# Patient Record
Sex: Female | Born: 1985 | Race: White | Hispanic: No | Marital: Married | Smoking: Never smoker
Health system: Southern US, Community
[De-identification: ages and names within clinical notes are randomized; demographics above are authoritative.]

## PROBLEM LIST (undated history)

## (undated) ENCOUNTER — Inpatient Hospital Stay: Payer: Self-pay

## (undated) DIAGNOSIS — R112 Nausea with vomiting, unspecified: Secondary | ICD-10-CM

## (undated) DIAGNOSIS — E039 Hypothyroidism, unspecified: Secondary | ICD-10-CM

## (undated) DIAGNOSIS — Z9889 Other specified postprocedural states: Secondary | ICD-10-CM

## (undated) DIAGNOSIS — F419 Anxiety disorder, unspecified: Secondary | ICD-10-CM

## (undated) HISTORY — DX: Anxiety disorder, unspecified: F41.9

## (undated) HISTORY — DX: Hypothyroidism, unspecified: E03.9

---

## 2005-06-09 ENCOUNTER — Ambulatory Visit: Payer: Self-pay

## 2011-02-02 HISTORY — PX: AUGMENTATION MAMMAPLASTY: SUR837

## 2011-05-21 ENCOUNTER — Ambulatory Visit: Payer: Self-pay | Admitting: Unknown Physician Specialty

## 2012-02-02 HISTORY — PX: BREAST ENHANCEMENT SURGERY: SHX7

## 2012-02-02 HISTORY — PX: TONSILLECTOMY: SUR1361

## 2012-02-25 HISTORY — PX: COLPOSCOPY: SHX161

## 2013-05-31 ENCOUNTER — Observation Stay: Payer: Self-pay

## 2013-05-31 LAB — PROTEIN / CREATININE RATIO, URINE
CREATININE, URINE: 112.6 mg/dL (ref 30.0–125.0)
PROTEIN, RANDOM URINE: 21 mg/dL — AB (ref 0–12)
PROTEIN/CREAT. RATIO: 187 mg/g{creat} (ref 0–200)

## 2013-05-31 LAB — PIH PROFILE
Anion Gap: 6 — ABNORMAL LOW (ref 7–16)
BUN: 6 mg/dL — AB (ref 7–18)
CHLORIDE: 107 mmol/L (ref 98–107)
Calcium, Total: 8.7 mg/dL (ref 8.5–10.1)
Co2: 26 mmol/L (ref 21–32)
Creatinine: 0.4 mg/dL — ABNORMAL LOW (ref 0.60–1.30)
GLUCOSE: 72 mg/dL (ref 65–99)
HCT: 37.9 % (ref 35.0–47.0)
HGB: 12.6 g/dL (ref 12.0–16.0)
MCH: 31.5 pg (ref 26.0–34.0)
MCHC: 33.2 g/dL (ref 32.0–36.0)
MCV: 95 fL (ref 80–100)
Osmolality: 274 (ref 275–301)
Platelet: 143 10*3/uL — ABNORMAL LOW (ref 150–440)
Potassium: 4.3 mmol/L (ref 3.5–5.1)
RBC: 3.99 10*6/uL (ref 3.80–5.20)
RDW: 12.9 % (ref 11.5–14.5)
SGOT(AST): 17 U/L (ref 15–37)
Sodium: 139 mmol/L (ref 136–145)
URIC ACID: 4.2 mg/dL (ref 2.6–6.0)
WBC: 11.2 10*3/uL — ABNORMAL HIGH (ref 3.6–11.0)

## 2013-06-20 ENCOUNTER — Observation Stay: Payer: Self-pay

## 2013-06-20 LAB — PIH PROFILE
AST: 13 U/L — AB (ref 15–37)
Anion Gap: 6 — ABNORMAL LOW (ref 7–16)
BUN: 7 mg/dL (ref 7–18)
CALCIUM: 8.8 mg/dL (ref 8.5–10.1)
CREATININE: 0.7 mg/dL (ref 0.60–1.30)
Chloride: 107 mmol/L (ref 98–107)
Co2: 25 mmol/L (ref 21–32)
EGFR (African American): >60
EGFR (Non-African Amer.): >60
Glucose: 75 mg/dL (ref 65–99)
HCT: 36.6 % (ref 35.0–47.0)
HGB: 12.2 g/dL (ref 12.0–16.0)
MCH: 31.4 pg (ref 26.0–34.0)
MCHC: 33.4 g/dL (ref 32.0–36.0)
MCV: 94 fL (ref 80–100)
Osmolality: 272 (ref 275–301)
PLATELETS: 134 10*3/uL — AB (ref 150–440)
POTASSIUM: 4.1 mmol/L (ref 3.5–5.1)
RBC: 3.89 10*6/uL (ref 3.80–5.20)
RDW: 13.1 % (ref 11.5–14.5)
Sodium: 138 mmol/L (ref 136–145)
URIC ACID: 4.2 mg/dL (ref 2.6–6.0)
WBC: 11.2 10*3/uL — ABNORMAL HIGH (ref 3.6–11.0)

## 2013-06-20 LAB — PROTEIN / CREATININE RATIO, URINE
CREATININE, URINE: 37.4 mg/dL (ref 30.0–125.0)
Protein, Random Urine: 5 mg/dL (ref 0–12)
Protein/Creat. Ratio: 134 mg/gCREAT (ref 0–200)

## 2013-06-27 ENCOUNTER — Inpatient Hospital Stay: Payer: Self-pay | Admitting: Obstetrics & Gynecology

## 2013-06-27 LAB — CBC WITH DIFFERENTIAL/PLATELET
BASOS PCT: 0.2 %
Basophil #: 0 10*3/uL (ref 0.0–0.1)
EOS ABS: 0.1 10*3/uL (ref 0.0–0.7)
Eosinophil %: 0.9 %
HCT: 36.2 % (ref 35.0–47.0)
HGB: 11.7 g/dL — ABNORMAL LOW (ref 12.0–16.0)
Lymphocyte #: 2.7 10*3/uL (ref 1.0–3.6)
Lymphocyte %: 20.2 %
MCH: 30.6 pg (ref 26.0–34.0)
MCHC: 32.4 g/dL (ref 32.0–36.0)
MCV: 95 fL (ref 80–100)
MONOS PCT: 7.3 %
Monocyte #: 1 x10 3/mm — ABNORMAL HIGH (ref 0.2–0.9)
NEUTROS PCT: 71.4 %
Neutrophil #: 9.7 10*3/uL — ABNORMAL HIGH (ref 1.4–6.5)
Platelet: 136 10*3/uL — ABNORMAL LOW (ref 150–440)
RBC: 3.83 10*6/uL (ref 3.80–5.20)
RDW: 13.1 % (ref 11.5–14.5)
WBC: 13.6 10*3/uL — AB (ref 3.6–11.0)

## 2013-06-29 LAB — HEMATOCRIT: HCT: 32.5 % — AB (ref 35.0–47.0)

## 2014-06-11 NOTE — H&P (Signed)
L&D Evaluation:  History Expanded:  HPI 29 yo G1, EDD of 07/01/13 per LMP and 8 wk [place]. Seen at office today with c/o headache and blurred vision & elevated BP's, 11 lb weight gain in 1 week and swelling. Pt had fall yesterday with some cramping afterwards. Cervix was 1.5 cm at office, she denies regular cramping today. PNC at [facility], [facility] entry [facility] now resolved. TDAP given 05/03/13.   Blood Type (Maternal) A positive   Group B Strep Results Maternal (Result >5wks must be treated as unknown) negative   Maternal HIV Negative   Maternal Syphilis Ab Nonreactive   Maternal Varicella Immune   Rubella Results (Maternal) immune   Presents with PIH eval   Patient's Medical History anxiety, depression, hypothyroidism   Patient's Surgical History breast augmentation, colposcopy, T&A   Medications Pre Natal Vitamins   Allergies Codeine   Social History none   Exam:  Vital Signs stable  131/87, 116/49, 108/51, 118/52, 155/67   Urine Protein negative dipstick   General no apparent distress   Mental Status clear   Chest clear   Heart no murmur/gallop/rubs   Abdomen gravid, non-tender   Edema 2+  Pitting   Reflexes 2+   Pelvic no external lesions, 1.5 at office   Mebranes Intact   FHT normal rate with no decels, baseline 135, + accelerations, no decels   Ucx absent   Other PC Ratio: 187, H&H 12.6 & 37.9, Plt 143, Uric Acid 4.2, SGOT 17   Impression:  Impression IUP at 35 wks, normotensive and normal labs   Plan:  Comments Discharge home, out of work x 1 wk - encouraged rest F/u on 5/4 at office Pre-eclampsia precautions Fioricet for ha's   Electronic Signatures: Jamariyah Johannsen, Rulon Sera (CNM)  (Signed 30-Apr-15 18:16)  Authored: L&D Evaluation   Last Updated: 30-Apr-15 18:16 by Ander Purpura (CNM)

## 2014-06-11 NOTE — H&P (Signed)
L&D Evaluation:  History Expanded:  HPI 29 yo G1, EDD of 07/01/13 per LMP and 8 wk [place] presents at 39 3/7 weeks from office for prolonged FHR monitoring. NST done for decreased FM, baseline was indeterminate with questionable accelerations vs tachycardia. Pt c/o occasional ctx after membranes stripped at office today. Denies LOF. +FM now. Pt with recent episodes of elevated BPs - normal workup. Also with moderate-severe edema in BLE. PNC at [facility] remarkable for  LLP now resolved, BMI>30, TDAP given 05/03/13.   Blood Type (Maternal) A positive   Maternal HIV Negative   Maternal Syphilis Ab Nonreactive   Maternal Varicella Immune   Rubella Results (Maternal) immune   Patient's Medical History anxiety, depression, hypothyroidism (normal TSH, T4 this pregnancy-no meds) , CIN 2014 with normal Pap this pregnancy   Patient's Surgical History breast augmentation, colposcopy, T&A   Medications Pre Natal Vitamins   Allergies Codeine   Social History none   Family History Non-Contributory   ROS:  ROS see HPI   Exam:  Vital Signs stable   General no apparent distress   Mental Status clear   Abdomen gravid, non-tender   Estimated Fetal Weight EFW 8# today   Edema 1+   Reflexes 2+   Pelvic 5/75/-2 to -3   Mebranes Intact   FHT normal rate with no decels, category 1, moderate variability, + accels   Ucx irregular   Skin dry   Impression:  Impression reactive NST, IUP @ [redacted]w[redacted]d for prolonged monitoring, early labor   Plan:  Comments Ambulated x 2 hrs and reassess cervix If unchanged, IOL scheduled on 6/3 per Evert Kohl   Follow Up Appointment already scheduled. 6/1   Electronic Signatures: Ander Purpura (CNM)  (Signed 27-May-15 18:54)  Authored: L&D Evaluation   Last Updated: 27-May-15 18:54 by Ander Purpura (CNM)

## 2014-06-11 NOTE — H&P (Signed)
L&D Evaluation:  History:  HPI 29 yo G1, EDD of 07/01/13 per LMP and 8 wk [place] presents at 38 3/7 weeks with c/o BP elevations at work of 130s/91 and spots in her vision. Has had increased swelling in legs/hands/face and was seen  4/30 with c/o headache and blurred vision & elevated BP's,(130/100), 11 lb weight gain in 1 week and swelling and had a negative PIH WU then.  PNC at [facility] remarkable for  LLP now resolved, Norovirus AGE in second trimester, BMI>30, TDAP given 05/03/13.   Presents with PIH eval   Patient's Medical History anxiety, depression, hypothyroidism (normal TSH, T4 this pregnancy-no meds) , CIN 2014 with normal Pap this pregnancy   Patient's Surgical History breast augmentation, colposcopy, T&A   Medications Pre Natal Vitamins   Allergies Codeine   Social History none   Family History Non-Contributory   ROS:  ROS see HPI   Exam:  Vital Signs 133/81, 117/65, 110/65, 113/70, 132/75, 127/75, 125/68, 115/68   Urine Protein PC ratio=134mg m   General no apparent distress, facial swelling   Mental Status clear   Chest clear   Heart normal sinus rhythm, no murmur/gallop/rubs   Abdomen gravid, non-tender   Edema 1+   Reflexes 2+   Mebranes Intact, AFI=7.32+6.35+5.45+4.32cm=23.44cm (bordering on polyhydraminos)   FHT normal rate with no decels, 135 with accels to 150, mod variability   Ucx rare   Skin dry   Other PIH labs: UA=4.2, SGOT=13, BUN/cr=7/0.7, plt=134K. Hct=36.6%   Impression:  Impression reactive NST, IUP at 38 4/7 weeks with possible gestational HTN. Essentially normotensive here today.No evidence of preeclampsia.  Possible polyhydramionos   Plan:  Plan DC home. Will start maternity lv today. Discussed lying down x1 hour in AM and PM. Has appt for ROB/growth scan tomorrow. Consider IOL at 39 weeks with favorable cx.   Electronic Signatures: Karene Fry (CNM)  (Signed 21-May-15 06:41)  Authored: L&D Evaluation   Last Updated:  21-May-15 06:41 by Karene Fry (CNM)

## 2014-07-18 ENCOUNTER — Encounter: Payer: Self-pay | Admitting: *Deleted

## 2014-07-19 ENCOUNTER — Ambulatory Visit (INDEPENDENT_AMBULATORY_CARE_PROVIDER_SITE_OTHER): Payer: 59

## 2014-07-19 VITALS — BP 93/67 | HR 94 | Ht 64.0 in | Wt 201.0 lb

## 2014-07-19 DIAGNOSIS — E669 Obesity, unspecified: Secondary | ICD-10-CM

## 2014-07-19 MED ORDER — CYANOCOBALAMIN 1000 MCG/ML IJ SOLN
1000.0000 ug | Freq: Once | INTRAMUSCULAR | Status: AC
  Administered 2014-07-19: 1000 ug via INTRAMUSCULAR

## 2014-07-19 NOTE — Progress Notes (Signed)
Patient ID: Kellie Davis, female   DOB: [birth year], 29 y.o.   MRN: [id number]   Pt presents for wt,bp, and b12 inj. Only s/e is pt feels agitated on adipex. Does not want to stop  the med because it is helping her lose weight. 6# weight loss this month. Advised pt to discuss with mnb at next visit.

## 2014-08-21 LAB — HM PAP SMEAR: HM Pap smear: NEGATIVE

## 2014-11-29 DIAGNOSIS — O008 Other ectopic pregnancy without intrauterine pregnancy: Secondary | ICD-10-CM

## 2014-11-29 DIAGNOSIS — K661 Hemoperitoneum: Secondary | ICD-10-CM

## 2015-01-06 ENCOUNTER — Emergency Department: Payer: BLUE CROSS/BLUE SHIELD | Admitting: Anesthesiology

## 2015-01-06 ENCOUNTER — Encounter
Admission: EM | Disposition: A | Discharge status: Home / Self Care | Payer: Self-pay | Source: Home / Self Care | Attending: Student

## 2015-01-06 ENCOUNTER — Encounter: Payer: Self-pay | Admitting: Emergency Medicine

## 2015-01-06 ENCOUNTER — Emergency Department: Payer: BLUE CROSS/BLUE SHIELD

## 2015-01-06 ENCOUNTER — Observation Stay
Admission: EM | Admit: 2015-01-06 | Discharge: 2015-01-10 | DRG: 777 | Disposition: A | Discharge status: Home / Self Care | Payer: BLUE CROSS/BLUE SHIELD | Attending: Obstetrics and Gynecology | Admitting: Obstetrics and Gynecology

## 2015-01-06 DIAGNOSIS — J9811 Atelectasis: Secondary | ICD-10-CM | POA: Diagnosis not present

## 2015-01-06 DIAGNOSIS — O009 Unspecified ectopic pregnancy without intrauterine pregnancy: Secondary | ICD-10-CM | POA: Diagnosis present

## 2015-01-06 DIAGNOSIS — K661 Hemoperitoneum: Secondary | ICD-10-CM | POA: Diagnosis not present

## 2015-01-06 DIAGNOSIS — E8809 Other disorders of plasma-protein metabolism, not elsewhere classified: Secondary | ICD-10-CM | POA: Diagnosis not present

## 2015-01-06 DIAGNOSIS — D62 Acute posthemorrhagic anemia: Secondary | ICD-10-CM | POA: Diagnosis not present

## 2015-01-06 DIAGNOSIS — R000 Tachycardia, unspecified: Secondary | ICD-10-CM | POA: Diagnosis not present

## 2015-01-06 DIAGNOSIS — R1031 Right lower quadrant pain: Secondary | ICD-10-CM

## 2015-01-06 DIAGNOSIS — I959 Hypotension, unspecified: Secondary | ICD-10-CM | POA: Diagnosis not present

## 2015-01-06 DIAGNOSIS — O001 Tubal pregnancy without intrauterine pregnancy: Principal | ICD-10-CM | POA: Insufficient documentation

## 2015-01-06 DIAGNOSIS — R0602 Shortness of breath: Secondary | ICD-10-CM

## 2015-01-06 DIAGNOSIS — O081 Delayed or excessive hemorrhage following ectopic and molar pregnancy: Secondary | ICD-10-CM | POA: Diagnosis not present

## 2015-01-06 DIAGNOSIS — F419 Anxiety disorder, unspecified: Secondary | ICD-10-CM | POA: Insufficient documentation

## 2015-01-06 DIAGNOSIS — R531 Weakness: Secondary | ICD-10-CM | POA: Insufficient documentation

## 2015-01-06 HISTORY — PX: LAPAROSCOPIC SALPINGO OOPHERECTOMY: SHX5927

## 2015-01-06 LAB — URINALYSIS COMPLETE WITH MICROSCOPIC (ARMC ONLY)
BILIRUBIN URINE: NEGATIVE
GLUCOSE, UA: NEGATIVE mg/dL
HGB URINE DIPSTICK: NEGATIVE
KETONES UR: NEGATIVE mg/dL
NITRITE: NEGATIVE
Protein, ur: NEGATIVE mg/dL
RBC / HPF: NONE SEEN RBC/hpf (ref 0–5)
SPECIFIC GRAVITY, URINE: 1.016 (ref 1.005–1.030)
pH: 5 (ref 5.0–8.0)

## 2015-01-06 LAB — HEMOGLOBIN AND HEMATOCRIT, BLOOD
HEMATOCRIT: 24.9 % — AB (ref 35.0–47.0)
HEMOGLOBIN: 8 g/dL — AB (ref 12.0–16.0)

## 2015-01-06 LAB — PREPARE RBC (CROSSMATCH)

## 2015-01-06 LAB — CBC WITH DIFFERENTIAL/PLATELET
Basophils Absolute: 0.1 10*3/uL (ref 0–0.1)
Basophils Relative: 1 %
EOS ABS: 0.2 10*3/uL (ref 0–0.7)
Eosinophils Relative: 1 %
HCT: 38.1 % (ref 35.0–47.0)
HEMOGLOBIN: 12.9 g/dL (ref 12.0–16.0)
LYMPHS ABS: 5.6 10*3/uL — AB (ref 1.0–3.6)
LYMPHS PCT: 32 %
MCH: 31.2 pg (ref 26.0–34.0)
MCHC: 33.8 g/dL (ref 32.0–36.0)
MCV: 92.5 fL (ref 80.0–100.0)
Monocytes Absolute: 1.3 10*3/uL — ABNORMAL HIGH (ref 0.2–0.9)
Monocytes Relative: 8 %
NEUTROS PCT: 58 %
Neutro Abs: 10.2 10*3/uL — ABNORMAL HIGH (ref 1.4–6.5)
Platelets: 197 10*3/uL (ref 150–440)
RBC: 4.12 MIL/uL (ref 3.80–5.20)
RDW: 12.6 % (ref 11.5–14.5)
WBC: 17.3 10*3/uL — AB (ref 3.6–11.0)

## 2015-01-06 LAB — COMPREHENSIVE METABOLIC PANEL
ALT: 14 U/L (ref 14–54)
ANION GAP: 8 (ref 5–15)
AST: 16 U/L (ref 15–41)
Albumin: 4 g/dL (ref 3.5–5.0)
Alkaline Phosphatase: 52 U/L (ref 38–126)
BUN: 15 mg/dL (ref 6–20)
CHLORIDE: 104 mmol/L (ref 101–111)
CO2: 23 mmol/L (ref 22–32)
CREATININE: 0.63 mg/dL (ref 0.44–1.00)
Calcium: 9 mg/dL (ref 8.9–10.3)
Glucose, Bld: 109 mg/dL — ABNORMAL HIGH (ref 65–99)
POTASSIUM: 2.7 mmol/L — AB (ref 3.5–5.1)
Sodium: 135 mmol/L (ref 135–145)
Total Bilirubin: 0.4 mg/dL (ref 0.3–1.2)
Total Protein: 7.2 g/dL (ref 6.5–8.1)

## 2015-01-06 LAB — ABO/RH: ABO/RH(D): A POS

## 2015-01-06 LAB — PROTIME-INR
INR: 1.37
Prothrombin Time: 17 seconds — ABNORMAL HIGH (ref 11.4–15.0)

## 2015-01-06 LAB — LIPASE, BLOOD: Lipase: 35 U/L (ref 11–51)

## 2015-01-06 LAB — HCG, QUANTITATIVE, PREGNANCY: HCG, BETA CHAIN, QUANT, S: 30548 m[IU]/mL — AB (ref <5)

## 2015-01-06 LAB — APTT: APTT: 26 s (ref 24–36)

## 2015-01-06 LAB — FIBRINOGEN: FIBRINOGEN: 194 mg/dL — AB (ref 210–470)

## 2015-01-06 SURGERY — SALPINGECTOMY, UNILATERAL, LAPAROSCOPIC
Anesthesia: General

## 2015-01-06 SURGERY — SALPINGO-OOPHORECTOMY, LAPAROSCOPIC
Anesthesia: General | Site: Abdomen | Laterality: Right | Wound class: Clean Contaminated

## 2015-01-06 MED ORDER — SODIUM CHLORIDE 0.9 % IV BOLUS (SEPSIS)
1000.0000 mL | Freq: Once | INTRAVENOUS | Status: AC
  Administered 2015-01-06: 1000 mL via INTRAVENOUS

## 2015-01-06 MED ORDER — FENTANYL CITRATE (PF) 100 MCG/2ML IJ SOLN
50.0000 ug | Freq: Once | INTRAMUSCULAR | Status: AC
  Administered 2015-01-06: 50 ug via INTRAVENOUS

## 2015-01-06 MED ORDER — SODIUM CHLORIDE 0.9 % IV SOLN
10.0000 mL/h | Freq: Once | INTRAVENOUS | Status: AC
  Administered 2015-01-06 (×2): via INTRAVENOUS

## 2015-01-06 MED ORDER — FENTANYL CITRATE (PF) 100 MCG/2ML IJ SOLN
INTRAMUSCULAR | Status: DC | PRN
  Administered 2015-01-06: 50 ug via INTRAVENOUS

## 2015-01-06 MED ORDER — VASOPRESSIN 20 UNIT/ML IV SOLN
INTRAVENOUS | Status: AC
  Filled 2015-01-06: qty 1

## 2015-01-06 MED ORDER — DIPHENHYDRAMINE HCL 12.5 MG/5ML PO ELIX: 12.5000 mg | ORAL_SOLUTION | Freq: Four times a day (QID) | ORAL | Status: DC | PRN

## 2015-01-06 MED ORDER — FENTANYL CITRATE (PF) 100 MCG/2ML IJ SOLN: 50.0000 ug | Freq: Once | INTRAMUSCULAR | Status: AC

## 2015-01-06 MED ORDER — DIPHENHYDRAMINE HCL 50 MG/ML IJ SOLN: 12.5000 mg | Freq: Four times a day (QID) | INTRAMUSCULAR | Status: DC | PRN

## 2015-01-06 MED ORDER — ONDANSETRON HCL 4 MG/2ML IJ SOLN: 4.0000 mg | Freq: Once | INTRAMUSCULAR | Status: DC | PRN

## 2015-01-06 MED ORDER — BUPIVACAINE HCL 0.5 % IJ SOLN
INTRAMUSCULAR | Status: DC | PRN
  Administered 2015-01-06: 20 mL

## 2015-01-06 MED ORDER — ONDANSETRON HCL 4 MG/2ML IJ SOLN
4.0000 mg | Freq: Once | INTRAMUSCULAR | Status: AC
  Administered 2015-01-06: 4 mg via INTRAVENOUS

## 2015-01-06 MED ORDER — FENTANYL CITRATE (PF) 100 MCG/2ML IJ SOLN
50.0000 ug | Freq: Once | INTRAMUSCULAR | Status: AC
  Administered 2015-01-06 (×6): 50 ug via INTRAVENOUS
  Filled 2015-01-06: qty 2

## 2015-01-06 MED ORDER — LIDOCAINE HCL (CARDIAC) 20 MG/ML IV SOLN
INTRAVENOUS | Status: DC | PRN
  Administered 2015-01-06: 40 mg via INTRAVENOUS

## 2015-01-06 MED ORDER — GLYCOPYRROLATE 0.2 MG/ML IJ SOLN
INTRAMUSCULAR | Status: DC | PRN
  Administered 2015-01-06: 0.4 mg via INTRAVENOUS

## 2015-01-06 MED ORDER — MIDAZOLAM HCL 2 MG/2ML IJ SOLN
INTRAMUSCULAR | Status: DC | PRN
  Administered 2015-01-06: 2 mg via INTRAVENOUS

## 2015-01-06 MED ORDER — ONDANSETRON HCL 4 MG/2ML IJ SOLN
4.0000 mg | Freq: Four times a day (QID) | INTRAMUSCULAR | Status: DC | PRN
  Administered 2015-01-07: 4 mg via INTRAVENOUS
  Filled 2015-01-06: qty 2

## 2015-01-06 MED ORDER — FENTANYL CITRATE (PF) 100 MCG/2ML IJ SOLN
INTRAMUSCULAR | Status: AC
  Filled 2015-01-06: qty 2

## 2015-01-06 MED ORDER — FENTANYL CITRATE (PF) 100 MCG/2ML IJ SOLN
25.0000 ug | INTRAMUSCULAR | Status: DC | PRN
  Administered 2015-01-06 (×5): 25 ug via INTRAVENOUS

## 2015-01-06 MED ORDER — PROPOFOL 10 MG/ML IV BOLUS
INTRAVENOUS | Status: DC | PRN
  Administered 2015-01-06: 150 mg via INTRAVENOUS

## 2015-01-06 MED ORDER — NEOSTIGMINE METHYLSULFATE 10 MG/10ML IV SOLN
INTRAVENOUS | Status: DC | PRN
  Administered 2015-01-06: 3 mg via INTRAVENOUS

## 2015-01-06 MED ORDER — KETOROLAC TROMETHAMINE 30 MG/ML IJ SOLN
30.0000 mg | Freq: Four times a day (QID) | INTRAMUSCULAR | Status: DC
  Administered 2015-01-06 – 2015-01-07 (×3): 30 mg via INTRAVENOUS
  Filled 2015-01-06 (×2): qty 1

## 2015-01-06 MED ORDER — NALOXONE HCL 0.4 MG/ML IJ SOLN: 0.4000 mg | INTRAMUSCULAR | Status: DC | PRN

## 2015-01-06 MED ORDER — MENTHOL 3 MG MT LOZG
1.0000 | LOZENGE | OROMUCOSAL | Status: DC | PRN
  Filled 2015-01-06: qty 9

## 2015-01-06 MED ORDER — ONDANSETRON HCL 4 MG/2ML IJ SOLN
INTRAMUSCULAR | Status: DC | PRN
  Administered 2015-01-06: 4 mg via INTRAVENOUS

## 2015-01-06 MED ORDER — ROCURONIUM BROMIDE 100 MG/10ML IV SOLN
INTRAVENOUS | Status: DC | PRN
  Administered 2015-01-06: 10 mg via INTRAVENOUS
  Administered 2015-01-06: 30 mg via INTRAVENOUS
  Administered 2015-01-06: 10 mg via INTRAVENOUS

## 2015-01-06 MED ORDER — LACTATED RINGERS IV SOLN
INTRAVENOUS | Status: DC | PRN
  Administered 2015-01-06 (×3): via INTRAVENOUS

## 2015-01-06 MED ORDER — PHENYLEPHRINE HCL 10 MG/ML IJ SOLN
INTRAMUSCULAR | Status: DC | PRN
  Administered 2015-01-06 (×5): 100 ug via INTRAVENOUS

## 2015-01-06 MED ORDER — DEXAMETHASONE SODIUM PHOSPHATE 4 MG/ML IJ SOLN
INTRAMUSCULAR | Status: DC | PRN
  Administered 2015-01-06: 4 mg via INTRAVENOUS

## 2015-01-06 MED ORDER — SODIUM CHLORIDE 0.9 % IJ SOLN: 9.0000 mL | INTRAMUSCULAR | Status: DC | PRN

## 2015-01-06 MED ORDER — MORPHINE SULFATE 2 MG/ML IV SOLN
INTRAVENOUS | Status: DC
  Administered 2015-01-06: via INTRAVENOUS
  Filled 2015-01-06: qty 25

## 2015-01-06 MED ORDER — ONDANSETRON HCL 4 MG/2ML IJ SOLN
4.0000 mg | Freq: Once | INTRAMUSCULAR | Status: AC
  Filled 2015-01-06: qty 2

## 2015-01-06 MED ORDER — KETOROLAC TROMETHAMINE 30 MG/ML IJ SOLN
30.0000 mg | Freq: Four times a day (QID) | INTRAMUSCULAR | Status: DC
Start: 2015-01-07 — End: 2015-01-07
  Filled 2015-01-06: qty 1

## 2015-01-06 SURGICAL SUPPLY — 43 items
ANCHOR TIS RET SYS 235ML (MISCELLANEOUS) ×2 IMPLANT
APPLICATOR ARISTA FLEXITIP XL (MISCELLANEOUS) ×2 IMPLANT
BAG URO DRAIN 2000ML W/SPOUT (MISCELLANEOUS) ×2 IMPLANT
BLADE SURG SZ11 CARB STEEL (BLADE) ×2 IMPLANT
CANISTER SUCT 1200ML W/VALVE (MISCELLANEOUS) ×4 IMPLANT
CANISTER SUCT 3000ML (MISCELLANEOUS) ×2 IMPLANT
CATH FOLEY 2WAY  5CC 16FR (CATHETERS)
CATH ROBINSON RED A/P 16FR (CATHETERS) ×2 IMPLANT
CATH URTH 16FR FL 2W BLN LF (CATHETERS) IMPLANT
CHLORAPREP W/TINT 26ML (MISCELLANEOUS) ×2 IMPLANT
DEVICE SUTURE ENDOST 10MM (ENDOMECHANICALS) ×2 IMPLANT
DEVICE TROCAR PUNCTURE CLOSURE (ENDOMECHANICALS) ×2 IMPLANT
GLOVE BIO SURGEON STRL SZ7 (GLOVE) ×6 IMPLANT
GLOVE INDICATOR 7.5 STRL GRN (GLOVE) ×8 IMPLANT
GOWN STRL REUS W/ TWL LRG LVL3 (GOWN DISPOSABLE) ×3 IMPLANT
GOWN STRL REUS W/ TWL XL LVL3 (GOWN DISPOSABLE) IMPLANT
GOWN STRL REUS W/TWL LRG LVL3 (GOWN DISPOSABLE) ×3
GOWN STRL REUS W/TWL XL LVL3 (GOWN DISPOSABLE)
GRASPER SUT TROCAR 14GX15 (MISCELLANEOUS) IMPLANT
IRRIGATION STRYKERFLOW (MISCELLANEOUS) ×2 IMPLANT
IRRIGATOR STRYKERFLOW (MISCELLANEOUS) ×4
IV LACTATED RINGER IRRG 3000ML (IV SOLUTION) ×1
IV LACTATED RINGERS 1000ML (IV SOLUTION) ×2 IMPLANT
IV LR IRRIG 3000ML ARTHROMATIC (IV SOLUTION) ×1 IMPLANT
KIT RM TURNOVER CYSTO AR (KITS) ×2 IMPLANT
LABEL OR SOLS (LABEL) IMPLANT
LIGASURE BLUNT 5MM 37CM (INSTRUMENTS) ×2 IMPLANT
LIQUID BAND (GAUZE/BANDAGES/DRESSINGS) ×2 IMPLANT
NS IRRIG 500ML POUR BTL (IV SOLUTION) ×2 IMPLANT
PACK GYN LAPAROSCOPIC (MISCELLANEOUS) ×2 IMPLANT
PAD OB MATERNITY 4.3X12.25 (PERSONAL CARE ITEMS) ×2 IMPLANT
PAD PREP 24X41 OB/GYN DISP (PERSONAL CARE ITEMS) ×2 IMPLANT
SCISSORS METZENBAUM CVD 33 (INSTRUMENTS) ×2 IMPLANT
SHEARS HARMONIC ACE PLUS 36CM (ENDOMECHANICALS) IMPLANT
SLEEVE ENDOPATH XCEL 5M (ENDOMECHANICALS) ×2 IMPLANT
SUT DVC VLOC 180 0 12IN GS21 (SUTURE) ×4
SUT MNCRL AB 4-0 PS2 18 (SUTURE) ×2 IMPLANT
SUT VIC AB 0 CT1 36 (SUTURE) ×2 IMPLANT
SUT VIC AB 2-0 UR6 27 (SUTURE) IMPLANT
SUTURE DVC VLC 180 0 12IN GS21 (SUTURE) ×2 IMPLANT
TROCAR ENDO BLADELESS 11MM (ENDOMECHANICALS) ×2 IMPLANT
TROCAR XCEL NON-BLD 5MMX100MML (ENDOMECHANICALS) ×2 IMPLANT
TUBING INSUFFLATOR HI FLOW (MISCELLANEOUS) ×2 IMPLANT

## 2015-01-06 NOTE — ED Notes (Signed)
Patient became lightheaded, diaphoretic, and pale.  Dr. Edd Fabian notified, beginning to transport patient back to room from [place] immediately.

## 2015-01-06 NOTE — Progress Notes (Signed)
Family and patient previously updated.  H&H of 8.0 and 24.7.  Fibrinogen level has just resulted 197, INR minimally elevated at 1.37.  Given no further active bleeding at this time no early administration of FFP/cryoprecipitate unless patient received and additional 2 units.  I will recheck the fibrinogen and INR with AM labs because as I have previously discussed with patient and family given amount of blood loss equal to about 8 units pRBC.  Discussed goal Hgb is 7.0 or above.

## 2015-01-06 NOTE — ED Provider Notes (Addendum)
[facility] Emergency Department Provider Note  ____________________________________________  Time seen: Approximately 4:45 PM  I have reviewed the triage vital signs and the nursing notes.   HISTORY  Chief Complaint Abdominal Pain    HPI Kellie Davis is a 29 y.o. female  with history of anxiety and hypothyroidism who presents for evaluation of severe right lower quadrant pain in the setting of early pregnancy. Patient is a G3 P1 at approximately 8 weeks estimated gestational age by last initial. With positive pregnancy test at her OB/GYN's office who presents for evaluation of gradual onset severe right lower quadrant pain today, worsening, no modifying factors, currently severe. No nausea vomiting or diarrhea. No fevers or chills. No abnormal vaginal bleeding. Prior to today, she had been in her usual state of health.   Past Medical History  Diagnosis Date  . Vaginal Pap smear, abnormal   . Constipation   . Contraceptive surveillance     There are no active problems to display for this patient.   Past Surgical History  Procedure Laterality Date  . Tonsillectomy  2014  . Breast enhancement surgery  2014    Current Outpatient Rx  Name  Route  Sig  Dispense  Refill  . drospirenone-ethinyl estradiol (YASMIN,ZARAH,SYEDA) 3-0.03 MG tablet   Oral   Take 1 tablet by mouth daily.         . phentermine (ADIPEX-P) 37.5 MG tablet   Oral   Take 37.5 mg by mouth daily before breakfast.         . vitamin B-12 (CYANOCOBALAMIN) 1000 MCG tablet   Intramuscular   Inject 1,000 mcg into the muscle every 30 (thirty) days.           Allergies Codeine  Family History  Problem Relation Age of Onset  . Heart disease Mother   . Diabetes Mother     Social History Social History  Substance Use Topics  . Smoking status: Never Smoker   . Smokeless tobacco: Never Used  . Alcohol Use: Yes     Comment: occas    Review of Systems Constitutional:  No fever/chills Eyes: No visual changes. ENT: No sore throat. Cardiovascular: Denies chest pain. Respiratory: Denies shortness of breath. Gastrointestinal: + abdominal pain.  No nausea, no vomiting.  No diarrhea.  No constipation. Genitourinary: Negative for dysuria. Musculoskeletal: Negative for back pain. Skin: Negative for rash. Neurological: Negative for headaches, focal weakness or numbness.  10-point ROS otherwise negative.  ____________________________________________   PHYSICAL EXAM:  VITAL SIGNS: ED Triage Vitals  Enc Vitals Group     BP 01/06/15 1625 186/34 mmHg     Pulse Rate 01/06/15 1625 74     Resp 01/06/15 1625 18     Temp 01/06/15 1625 97.7 F (36.5 C)     Temp Source 01/06/15 1625 Oral     SpO2 01/06/15 1625 0 %     Weight 01/06/15 1631 200 lb (90.719 kg)     Height 01/06/15 1631 5\' 4"  (1.626 m)     Head Cir --      Peak Flow --      Pain Score 01/06/15 1631 10     Pain Loc --      Pain Edu? --      Excl. in [place]? --     Constitutional: Alert and oriented. In distress secondary to pain. Eyes: Conjunctivae are normal. PERRL. EOMI. Head: Atraumatic. Nose: No congestion/rhinnorhea. Mouth/Throat: Mucous membranes are moist.  Oropharynx non-erythematous. Neck: No stridor.  Cardiovascular: Normal rate, regular rhythm. Grossly normal heart sounds.  Good peripheral circulation. Respiratory: Normal respiratory effort.  No retractions. Lungs CTAB. Gastrointestinal: Soft with moderate tenderness to palpation in the right lower quadrant the super pubic region. No CVA tenderness. Genitourinary: deferred Musculoskeletal: No lower extremity tenderness nor edema.  No joint effusions. Neurologic:  Normal speech and language. No gross focal neurologic deficits are appreciated. No gait instability. Skin:  Skin is warm, dry and intact. No rash noted. Psychiatric: Mood and affect are normal. Speech and behavior are normal.  ____________________________________________    LABS (all labs ordered are listed, but only abnormal results are displayed)  Labs Reviewed  COMPREHENSIVE METABOLIC PANEL - Abnormal; Notable for the following:    Potassium 2.7 (*)    Glucose, Bld 109 (*)    All other components within normal limits  CBC WITH DIFFERENTIAL/PLATELET - Abnormal; Notable for the following:    WBC 17.3 (*)    Neutro Abs 10.2 (*)    Lymphs Abs 5.6 (*)    Monocytes Absolute 1.3 (*)    All other components within normal limits  HCG, QUANTITATIVE, PREGNANCY - Abnormal; Notable for the following:    hCG, Beta Chain, Quant, S 30548 (*)    All other components within normal limits  URINALYSIS COMPLETEWITH MICROSCOPIC ([facility] ONLY) - Abnormal; Notable for the following:    Color, Urine YELLOW (*)    APPearance CLEAR (*)    Leukocytes, UA TRACE (*)    Bacteria, UA RARE (*)    Squamous Epithelial / LPF 0-5 (*)    All other components within normal limits  LIPASE, BLOOD  POC URINE PREG, ED  TYPE AND SCREEN  ABO/RH  PREPARE RBC (CROSSMATCH)   ____________________________________________  EKG  none ____________________________________________  RADIOLOGY  Transvaginal ultrasound IMPRESSION: No intrauterine gestational sac. 3.9 cm complex right adnexal mass which appears separate from the right ovary, with moderate to large amount of complex free fluid. This is suspicious for ruptured ectopic pregnancy.  ____________________________________________   PROCEDURES  Procedure(s) performed: None  Critical Care performed: Total critical care time spent 30 minutes.  ____________________________________________   INITIAL IMPRESSION / ASSESSMENT AND PLAN / ED COURSE  Pertinent labs & imaging results that were available during my care of the patient were reviewed by me and considered in my medical decision making (see chart for details).  Kellie Davis is a 29 y.o. female  with history of anxiety and hypothyroidism who presents for evaluation of  severe right lower quadrant pain in the setting of early pregnancy. On exam, she is in distress secondary to pain. Blood pressure at triage was 86/34 however has improved to systolic BP of 123456 on arrival to room 9 in the emergency department before any intervention was given. She has tenderness to palpation throughout the right abdomen. Limited bedside ultrasound performed by me shows no intrauterine pregnancy but beta hCG unknown at this time. Positive ER urine pregnancy test. We'll give fluids, pain control. We'll send emergently for formal ultrasound due to concern for ectopic.  ----------------------------------------- 6:00 PM on 01/06/2015 -----------------------------------------  On return from ultrasound, patient with SBP now in 80s and diaphoretic, continue IV fluids and she has been consented for emergent blood transfusion. Discussed case with Dr. Georgianne Fick given concern for ruptured ectopic at this time, he is on his way to evaluate the patient. [place] read pending. As a roofer leukocytosis, stable hemoglobin at 12.9. Normal lipase.  ----------------------------------------- 6:10 PM on 01/06/2015 ----------------------------------------- Dr. Star Age at bedside for  assessment and will take patient emergently to OR.  ____________________________________________   FINAL CLINICAL IMPRESSION(S) / ED DIAGNOSES  Final diagnoses:  RLQ abdominal pain  Ruptured ectopic pregnancy      Joanne Gavel, MD 01/06/15 1842  Joanne Gavel, MD 01/06/15 1843

## 2015-01-06 NOTE — ED Notes (Addendum)
  Urine preg POC positive. MD informed

## 2015-01-06 NOTE — Progress Notes (Signed)
Clinic chart reviewed prior HCG 11/29/14 negative, then 12/20/14 109 with rise to 464, was scheduled for follow up on 01/09/2015 with ultrasound, no ultrasound done to date at [facility].  A positive rhogam not indicated

## 2015-01-06 NOTE — Anesthesia Procedure Notes (Signed)
Procedure Name: Intubation Date/Time: 01/06/2015 7:11 PM Performed by: Aline Brochure Pre-anesthesia Checklist: Patient identified, Emergency Drugs available, Suction available and Patient being monitored Patient Re-evaluated:Patient Re-evaluated prior to inductionOxygen Delivery Method: Circle system utilized Preoxygenation: Pre-oxygenation with 100% oxygen Intubation Type: Rapid sequence, Cricoid Pressure applied and IV induction Laryngoscope Size: Mac and 3 Grade View: Grade I Tube type: Oral Tube size: 7.0 mm Number of attempts: 1 Airway Equipment and Method: Patient positioned with wedge pillow and Stylet Placement Confirmation: ETT inserted through vocal cords under direct vision,  breath sounds checked- equal and bilateral and positive ETCO2 Secured at: 21 cm Tube secured with: Tape Dental Injury: Teeth and Oropharynx as per pre-operative assessment

## 2015-01-06 NOTE — ED Notes (Signed)
Pt transported to OR

## 2015-01-06 NOTE — Anesthesia Preprocedure Evaluation (Signed)
Anesthesia Evaluation  Patient identified by MRN, date of birth, ID band Patient awake    Reviewed: Allergy & Precautions, NPO status , Patient's Chart, lab work & pertinent test results  History of Anesthesia Complications Negative for: history of anesthetic complications  Airway Mallampati: III       Dental  (+) Teeth Intact   Pulmonary neg pulmonary ROS,           Cardiovascular negative cardio ROS       Neuro/Psych Seizures - (febrile as a child),  negative neurological ROS     GI/Hepatic   Endo/Other  Hypothyroidism (no meds)   Renal/GU      Musculoskeletal   Abdominal   Peds  Hematology   Anesthesia Other Findings   Reproductive/Obstetrics                             Anesthesia Physical Anesthesia Plan  ASA: II and emergent  Anesthesia Plan: General   Post-op Pain Management:    Induction: Intravenous  Airway Management Planned: Oral ETT  Additional Equipment:   Intra-op Plan:   Post-operative Plan:   Informed Consent: I have reviewed the patients History and Physical, chart, labs and discussed the procedure including the risks, benefits and alternatives for the proposed anesthesia with the patient or authorized representative who has indicated his/her understanding and acceptance.     Plan Discussed with:   Anesthesia Plan Comments:         Anesthesia Quick Evaluation

## 2015-01-06 NOTE — H&P (Signed)
Obstetrics & Gynecology  H&P   History of Present Illness: Patient is a 29 y.o. [id number] presenting to ER with likely ruptured ectopic pregnancy.  Patient with +HCG, sudden onset abdominal pain today.  Following ultrasound became hypotensive, was issued emergency release blood.  OR notified prior to my patient to start setting up for laparoscopic salpingectomy.  Ultrasound showing large amount of free fluid, no IUP, and 3cm adnexal mass consistent with ectopic pregnancy  Review of Systems:10 point review of systems  Past Medical History:  Past Medical History  Diagnosis Date  . Vaginal Pap smear, abnormal   . Constipation   . Contraceptive surveillance     Past Surgical History:  Past Surgical History  Procedure Laterality Date  . Tonsillectomy  2014  . Breast enhancement surgery  2014    Family History:  Family History  Problem Relation Age of Onset  . Heart disease Mother   . Diabetes Mother     Social History:  Social History   Social History  . Marital Status: Married    Spouse Name: N/A  . Number of Children: N/A  . Years of Education: N/A   Occupational History  . Not on file.   Social History Main Topics  . Smoking status: Never Smoker   . Smokeless tobacco: Never Used  . Alcohol Use: Yes     Comment: occas  . Drug Use: No  . Sexual Activity: Yes    Birth Control/ Protection: Pill   Other Topics Concern  . Not on file   Social History Narrative    Allergies:  Allergies  Allergen Reactions  . Codeine     Medications: Prior to Admission medications   Medication Sig Start Date End Date Taking? Authorizing Provider  drospirenone-ethinyl estradiol (YASMIN,ZARAH,SYEDA) 3-0.03 MG tablet Take 1 tablet by mouth daily.    Historical Provider, MD  phentermine (ADIPEX-P) 37.5 MG tablet Take 37.5 mg by mouth daily before breakfast.    Historical Provider, MD  vitamin B-12 (CYANOCOBALAMIN) 1000 MCG tablet Inject 1,000 mcg into the muscle every 30 (thirty)  days.    Historical Provider, MD    Physical Exam Vitals: Blood pressure 79/51, pulse 95, temperature 97.7 F (36.5 C), temperature source Oral, resp. rate 21, height $RemoveBe'5\' 4"'KuEwfZauD$  (1.626 m), weight 90.719 kg (200 lb), SpO2 98 %. General: HEENT: Pulmonary: Cardiovascular: Abdomen: Genitourinary: Extremities: Neurologic: Psychiatric:  Labs: Results for orders placed or performed during the hospital encounter of 01/06/15 (from the past 72 hour(s))  Comprehensive metabolic panel     Status: Abnormal   Collection Time: 01/06/15  4:35 PM  Result Value Ref Range   Sodium 135 135 - 145 mmol/L   Potassium 2.7 (LL) 3.5 - 5.1 mmol/L    Comment: CRITICAL RESULT CALLED TO, READ BACK BY AND VERIFIED WITH STEVE SNIDER AT 1720 01/06/15 MLZ    Chloride 104 101 - 111 mmol/L   CO2 23 22 - 32 mmol/L   Glucose, Bld 109 (H) 65 - 99 mg/dL   BUN 15 6 - 20 mg/dL   Creatinine, Ser 0.63 0.44 - 1.00 mg/dL   Calcium 9.0 8.9 - 10.3 mg/dL   Total Protein 7.2 6.5 - 8.1 g/dL   Albumin 4.0 3.5 - 5.0 g/dL   AST 16 15 - 41 U/L   ALT 14 14 - 54 U/L   Alkaline Phosphatase 52 38 - 126 U/L   Total Bilirubin 0.4 0.3 - 1.2 mg/dL   GFR calc non Af Amer >60 >60 mL/min  GFR calc Af Amer >60 >60 mL/min    Comment: (NOTE) The eGFR has been calculated using the CKD EPI equation. This calculation has not been validated in all clinical situations. eGFR's persistently <60 mL/min signify possible Chronic Kidney Disease.    Anion gap 8 5 - 15  CBC WITH DIFFERENTIAL     Status: Abnormal   Collection Time: 01/06/15  4:35 PM  Result Value Ref Range   WBC 17.3 (H) 3.6 - 11.0 K/uL   RBC 4.12 3.80 - 5.20 MIL/uL   Hemoglobin 12.9 12.0 - 16.0 g/dL   HCT 38.1 35.0 - 47.0 %   MCV 92.5 80.0 - 100.0 fL   MCH 31.2 26.0 - 34.0 pg   MCHC 33.8 32.0 - 36.0 g/dL   RDW 12.6 11.5 - 14.5 %   Platelets 197 150 - 440 K/uL   Neutrophils Relative % 58 %   Neutro Abs 10.2 (H) 1.4 - 6.5 K/uL   Lymphocytes Relative 32 %   Lymphs Abs 5.6 (H)  1.0 - 3.6 K/uL   Monocytes Relative 8 %   Monocytes Absolute 1.3 (H) 0.2 - 0.9 K/uL   Eosinophils Relative 1 %   Eosinophils Absolute 0.2 0 - 0.7 K/uL   Basophils Relative 1 %   Basophils Absolute 0.1 0 - 0.1 K/uL  hCG, quantitative, pregnancy     Status: Abnormal   Collection Time: 01/06/15  4:35 PM  Result Value Ref Range   hCG, Beta Chain, Quant, S 30548 (H) <5 mIU/mL    Comment:          GEST. AGE      CONC.  (mIU/mL)   <=1 WEEK        5 - 50     2 WEEKS       50 - 500     3 WEEKS       100 - 10,000     4 WEEKS     1,000 - 30,000     5 WEEKS     3,500 - 115,000   6-8 WEEKS     12,000 - 270,000    12 WEEKS     15,000 - 220,000        FEMALE AND NON-PREGNANT FEMALE:     LESS THAN 5 mIU/mL   Lipase, blood     Status: None   Collection Time: 01/06/15  4:35 PM  Result Value Ref Range   Lipase 35 11 - 51 U/L  Urinalysis complete, with microscopic ([facility] only)     Status: Abnormal   Collection Time: 01/06/15  4:35 PM  Result Value Ref Range   Color, Urine YELLOW (A) YELLOW   APPearance CLEAR (A) CLEAR   Glucose, UA NEGATIVE NEGATIVE mg/dL   Bilirubin Urine NEGATIVE NEGATIVE   Ketones, ur NEGATIVE NEGATIVE mg/dL   Specific Gravity, Urine 1.016 1.005 - 1.030   Hgb urine dipstick NEGATIVE NEGATIVE   pH 5.0 5.0 - 8.0   Protein, ur NEGATIVE NEGATIVE mg/dL   Nitrite NEGATIVE NEGATIVE   Leukocytes, UA TRACE (A) NEGATIVE   RBC / HPF NONE SEEN 0 - 5 RBC/hpf   WBC, UA 0-5 0 - 5 WBC/hpf   Bacteria, UA RARE (A) NONE SEEN   Squamous Epithelial / LPF 0-5 (A) NONE SEEN   Mucous PRESENT   Type and screen [facility]     Status: None (Preliminary result)   Collection Time: 01/06/15  4:36 PM  Result Value Ref Range  ABO/RH(D) A POS    Antibody Screen NEG    Sample Expiration 01/09/2015    Unit Number [id number]    Blood Component Type RED CELLS,LR    Unit division 00    Status of Unit REL FROM [facility]    Transfusion Status PENDING    Crossmatch Result PENDING     Unit Number [id number]    Blood Component Type RED CELLS,LR    Unit division 00    Status of Unit ALLOCATED    Transfusion Status PENDING    Crossmatch Result PENDING     Imaging US Ob Comp Less 14 Wks  01/06/2015  CLINICAL DATA:  Right lower quadrant pain. Gestational age by LMP of 6 weeks 5 days. EXAM: OBSTETRIC <14 WK [place] AND TRANSVAGINAL OB US TECHNIQUE: Both transabdominal and transvaginal ultrasound examinations were performed for complete evaluation of the gestation as well as the maternal uterus, adnexal regions, and pelvic cul-de-sac. Transvaginal technique was performed to assess early pregnancy. COMPARISON:  None. FINDINGS: No intrauterine gestational sac visualized. Both ovaries appear normal. There is a complex mass with peripheral hyperechoic echotexture and small central cystic area in the right adnexa, which appears separate from the right ovary. This measures 3.9 x 3.1 x 2.8 cm. There is also a moderate to large amount of complex free fluid within the pelvis in both adnexa and surrounding the uterus. IMPRESSION: No intrauterine gestational sac. 3.9 cm complex right adnexal mass which appears separate from the right ovary, with moderate to large amount of complex free fluid. This is suspicious for ruptured ectopic pregnancy. Critical Value/emergent results were called by telephone at the time of interpretation on 01/06/2015 at 6:16 pm to Dr. Loura Pardon , who verbally acknowledged these results. Electronically Signed   By: Earle Gell M.D.   On: 01/06/2015 18:19   US Ob Transvaginal  01/06/2015  CLINICAL DATA:  Right lower quadrant pain. Gestational age by LMP of 6 weeks 5 days. EXAM: OBSTETRIC <14 WK [place] AND TRANSVAGINAL OB US TECHNIQUE: Both transabdominal and transvaginal ultrasound examinations were performed for complete evaluation of the gestation as well as the maternal uterus, adnexal regions, and pelvic cul-de-sac. Transvaginal technique was performed to assess early  pregnancy. COMPARISON:  None. FINDINGS: No intrauterine gestational sac visualized. Both ovaries appear normal. There is a complex mass with peripheral hyperechoic echotexture and small central cystic area in the right adnexa, which appears separate from the right ovary. This measures 3.9 x 3.1 x 2.8 cm. There is also a moderate to large amount of complex free fluid within the pelvis in both adnexa and surrounding the uterus. IMPRESSION: No intrauterine gestational sac. 3.9 cm complex right adnexal mass which appears separate from the right ovary, with moderate to large amount of complex free fluid. This is suspicious for ruptured ectopic pregnancy. Critical Value/emergent results were called by telephone at the time of interpretation on 01/06/2015 at 6:16 pm to Dr. Loura Pardon , who verbally acknowledged these results. Electronically Signed   By: Earle Gell M.D.   On: 01/06/2015 18:19    Assessment: 29 y.o. [id number] presenting with ruptured right ectopic pregnancy  Plan: - emergency release 1 unit of packed red blood cells by ER - OR notified - Proceed with emergent diagnostic laparoscopy and right salpingectomy for ruptured ectopic

## 2015-01-06 NOTE — Op Note (Signed)
Preoperative Diagnosis: 1) 29 y.o.  [id number] with ruptured right ectopic pregnancy  Postoperative Diagnosis: 1) 29 y.o. [id number] with ruptured right cornual ectopic  Operation Performed: Laparoscopic right salpingectomy and partial cornual resection  Indication: 29 y.o. [id number]  Presenting with acute onset abdominal pain around 16:30 today prompting presentation to the ER.  Some spotting earlier in the pregnancy with normally rising BHCG, no prior ultrasound given under the discriminatory zone.  On ER presentation initial Hgb 12.5, ultrasound with large amount of complex fluid, right adnexal mass suspicious for ruptured ectopic with patient suddenly becoming hypotensive.  Anesthesia: General  Preoperative Antibiotics: none  Estimated Blood Loss: 2469mL with about 229mL during the procedure.  IV Fluids: 3554mL crystaloid +1 unit of pRBC preoperatively in ER  Urine Output:: 28mL  Drains or Tubes: none  Implants: none  Specimens Removed: right tube and portion of right cornua containing ectopic  Complications: none  Intraoperative Findings: Large amount of hemoperitoneum, ectopic in the distal portion of the fallopian tube with active hemorrhage.  Normal right ovary, normal left ovary and tube, normal appendix, normal uterus.  Hemoperitoneum initially totally obscuring liver edge.    Patient Condition: guarded  Procedure in Detail:  Patient was taken to the operating room where she was administered general anesthesia.  She was positioned in the dorsal lithotomy position utilizing Allen stirups, prepped and draped in the usual sterile fashion.  Prior to proceeding with procedure a time out was performed.  Attention was turned to the patient's pelvis.  A red rubber catheter was used to empty the patient's bladder.  An operative speculum was placed to allow visualization of the cervix.  The anterior lip of the cervix was grasped with a single tooth tenaculum, and a Hulka tenaculum was placed  to allow manipulation of the uterus.  The operative speculum and single tooth tenaculum were then removed.  Attention was turned to the patient's abdomen.  The umbilicus was infiltrated with 1% Sensorcaine, before making a stab incision using an 11 blade scalpel.  A 35mm Excel trocar was then used to gain direct entry into the peritoneal cavity utilizing the camera to visualize progress of the trocar during placement.  Once peritoneal entry had been achieved, insufflation was started and pneumoperitoneum established at a pressure of 66mmHg.   General inspection of the abdomen revealed the above noted findings.  A spot in the right lower quadrant was transilluminated to verify it was clear of vessel, injected with 1% Sensorcaine and a stab incision was made using an 11 blade scalpel.  An 34m excel trocar was place through this incision under direct visualization.  Some on the hemoperitoneum was evacuated using a 58mm suction irrigator aid in visualizing the ectopic.  A second 77mm trocar was placed in similar fashion under direct visualization.  The ectopic was grasped with a atraumatic grasper before and then excised using the 39mm Ligasure device.  The ectopic was placed in the cul de sac and the defect in the right cornua was copiously irrigated. The defect was closed using an Endoclose device in a running fashion using a V-lock load.  The remainder of the procedure was spent evacuating the remaining clot burden in the mid and upper abdomen.  The ectopic and distal right tube were removed using a laparoscopic endocatch bag.  The cornua was re-inspected, noted to be hemostatic.  1g of Arrista powder was applied to cornua.  A Carter-Thompson closure device with a cone was used to close the 93mm port site with  visualiztion as the suture was tied down.  No remaining defect was noted after probing the incision digitally.  Pneumoperitoneum was evacuating with continued good hemostasis noted as pneumoperitoneum was  released..     The trocars were removed.  The 11 mm trocar site was closed with 4-0 Monocryl in a subcuticular fashion.  All trocar sites were then dressed with surgical skin glue.  The Hulka tenaculum was removed.  Sponge needle and instrument counts were correct time two.  The patient tolerated the procedure well and was taken to the recovery room in guarded condition, with repeat CBC and coags pending, a second unit of blood ordered for administration in the PACU.

## 2015-01-06 NOTE — Anesthesia Postprocedure Evaluation (Signed)
Anesthesia Post Note  Patient: Kellie Davis  Procedure(s) Performed: Procedure(s) (LRB): LAPAROSCOPIC right SALPINGECTOMy with removal of ectopic and partial  right corneal resection  (Right)  Patient location during evaluation: PACU Anesthesia Type: General Level of consciousness: awake and alert Pain management: pain level controlled Vital Signs Assessment: post-procedure vital signs reviewed and stable Respiratory status: spontaneous breathing and respiratory function stable Cardiovascular status: stable Anesthetic complications: no    Last Vitals:  Filed Vitals:   01/06/15 2215 01/06/15 2220  BP:  103/66  Pulse: 75 78  Temp:    Resp: 20 11    Last Pain:  Filed Vitals:   01/06/15 2222  PainSc: 8                  Daiquan Resnik K

## 2015-01-06 NOTE — Transfer of Care (Signed)
Immediate Anesthesia Transfer of Care Note  Patient: Kellie Davis  Procedure(s) Performed: Procedure(s): LAPAROSCOPIC right SALPINGECTOMy with removal of ectopic and partial  right corneal resection  (Right)  Patient Location: PACU  Anesthesia Type:General  Level of Consciousness: awake, alert , oriented and patient cooperative  Airway & Oxygen Therapy: Patient Spontanous Breathing and Patient connected to face mask oxygen  Post-op Assessment: Report given to RN and Post -op Vital signs reviewed and stable  Post vital signs: Reviewed and stable  Last Vitals:  Filed Vitals:   01/06/15 2120 01/06/15 2125  BP: 95/43   Pulse: 103 134  Temp: 36.7 C   Resp: 19 32    Complications: No apparent anesthesia complications

## 2015-01-06 NOTE — ED Notes (Signed)
Severe R lower abd pain, [redacted] weeks pregnant

## 2015-01-06 NOTE — ED Notes (Signed)
Patient consents to emergency blood transfusion with this nurse, edp, and family at bedside

## 2015-01-06 NOTE — ED Notes (Signed)
2 silver colored rings and silver colored earrings (2), 1 nose rign given to [employer], husband.  Clothes given as well.

## 2015-01-06 NOTE — ED Notes (Addendum)
Report given to OR with Olean Ree, RN

## 2015-01-07 ENCOUNTER — Other Ambulatory Visit: Payer: Self-pay

## 2015-01-07 ENCOUNTER — Encounter: Payer: Self-pay | Admitting: Obstetrics and Gynecology

## 2015-01-07 ENCOUNTER — Observation Stay: Payer: BLUE CROSS/BLUE SHIELD

## 2015-01-07 LAB — CBC
HCT: 22.2 % — ABNORMAL LOW (ref 35.0–47.0)
HEMATOCRIT: 25.9 % — AB (ref 35.0–47.0)
HEMOGLOBIN: 8.6 g/dL — AB (ref 12.0–16.0)
Hemoglobin: 7.3 g/dL — ABNORMAL LOW (ref 12.0–16.0)
MCH: 29.8 pg (ref 26.0–34.0)
MCH: 29.9 pg (ref 26.0–34.0)
MCHC: 33.2 g/dL (ref 32.0–36.0)
MCHC: 33 g/dL (ref 32.0–36.0)
MCV: 89.8 fL (ref 80.0–100.0)
MCV: 90.4 fL (ref 80.0–100.0)
PLATELETS: 89 10*3/uL — AB (ref 150–440)
Platelets: 117 10*3/uL — ABNORMAL LOW (ref 150–440)
RBC: 2.89 MIL/uL — AB (ref 3.80–5.20)
RBC: 2.45 MIL/uL — ABNORMAL LOW (ref 3.80–5.20)
RDW: 14.7 % — ABNORMAL HIGH (ref 11.5–14.5)
RDW: 14.9 % — AB (ref 11.5–14.5)
WBC: 15.2 10*3/uL — AB (ref 3.6–11.0)
WBC: 16.7 10*3/uL — AB (ref 3.6–11.0)

## 2015-01-07 LAB — PROTIME-INR
INR: 1.18
PROTHROMBIN TIME: 15.2 s — AB (ref 11.4–15.0)

## 2015-01-07 LAB — PREPARE RBC (CROSSMATCH)

## 2015-01-07 LAB — BASIC METABOLIC PANEL
ANION GAP: 4 — AB (ref 5–15)
Anion gap: 4 — ABNORMAL LOW (ref 5–15)
BUN: 13 mg/dL (ref 6–20)
BUN: 13 mg/dL (ref 6–20)
CALCIUM: 7 mg/dL — AB (ref 8.9–10.3)
CHLORIDE: 107 mmol/L (ref 101–111)
CO2: 22 mmol/L (ref 22–32)
CO2: 23 mmol/L (ref 22–32)
CREATININE: 0.69 mg/dL (ref 0.44–1.00)
CREATININE: 0.67 mg/dL (ref 0.44–1.00)
Calcium: 6.6 mg/dL — ABNORMAL LOW (ref 8.9–10.3)
Chloride: 109 mmol/L (ref 101–111)
GFR calc non Af Amer: >60 mL/min (ref >60)
GFR calc non Af Amer: >60 mL/min (ref >60)
GLUCOSE: 94 mg/dL (ref 65–99)
Glucose, Bld: 144 mg/dL — ABNORMAL HIGH (ref 65–99)
POTASSIUM: 4.2 mmol/L (ref 3.5–5.1)
Potassium: 3.7 mmol/L (ref 3.5–5.1)
SODIUM: 133 mmol/L — AB (ref 135–145)
Sodium: 136 mmol/L (ref 135–145)

## 2015-01-07 LAB — FIBRINOGEN: FIBRINOGEN: 280 mg/dL (ref 210–470)

## 2015-01-07 LAB — HCG, QUANTITATIVE, PREGNANCY: hCG, Beta Chain, Quant, S: 5566 m[IU]/mL — ABNORMAL HIGH (ref <5)

## 2015-01-07 LAB — MAGNESIUM
MAGNESIUM: 1.5 mg/dL — AB (ref 1.7–2.4)
Magnesium: 1.4 mg/dL — ABNORMAL LOW (ref 1.7–2.4)

## 2015-01-07 MED ORDER — PROCHLORPERAZINE EDISYLATE 5 MG/ML IJ SOLN
10.0000 mg | Freq: Four times a day (QID) | INTRAMUSCULAR | Status: DC | PRN
  Administered 2015-01-07: 10 mg via INTRAVENOUS
  Filled 2015-01-07 (×3): qty 2

## 2015-01-07 MED ORDER — SODIUM CHLORIDE 0.9 % IV SOLN
Freq: Once | INTRAVENOUS | Status: AC
  Administered 2015-01-07: 22:00:00 via INTRAVENOUS

## 2015-01-07 MED ORDER — SODIUM CHLORIDE 0.9 % IV SOLN
INTRAVENOUS | Status: DC
  Administered 2015-01-07 – 2015-01-08 (×2): via INTRAVENOUS

## 2015-01-07 MED ORDER — SODIUM CHLORIDE 0.9 % IV SOLN
1.0000 g | Freq: Once | INTRAVENOUS | Status: AC
  Administered 2015-01-07: 1 g via INTRAVENOUS
  Filled 2015-01-07: qty 10

## 2015-01-07 MED ORDER — LORAZEPAM 0.5 MG PO TABS
2.0000 mg | ORAL_TABLET | Freq: Once | ORAL | Status: AC
  Administered 2015-01-07: 2 mg via ORAL
  Filled 2015-01-07: qty 4

## 2015-01-07 MED ORDER — DIPHENHYDRAMINE HCL 50 MG/ML IJ SOLN
25.0000 mg | Freq: Once | INTRAMUSCULAR | Status: AC
  Administered 2015-01-07: 25 mg via INTRAVENOUS
  Filled 2015-01-07: qty 1

## 2015-01-07 MED ORDER — ONDANSETRON HCL 4 MG/2ML IJ SOLN: 4.0000 mg | Freq: Four times a day (QID) | INTRAMUSCULAR | Status: DC | PRN

## 2015-01-07 MED ORDER — DIPHENHYDRAMINE HCL 25 MG PO CAPS: 50.0000 mg | ORAL_CAPSULE | Freq: Four times a day (QID) | ORAL | Status: DC | PRN

## 2015-01-07 MED ORDER — ONDANSETRON HCL 4 MG PO TABS: 4.0000 mg | ORAL_TABLET | Freq: Three times a day (TID) | ORAL | Status: DC | PRN

## 2015-01-07 MED ORDER — OXYCODONE-ACETAMINOPHEN 5-325 MG PO TABS: 1.0000 | ORAL_TABLET | Freq: Four times a day (QID) | ORAL | Status: DC | PRN

## 2015-01-07 MED ORDER — SODIUM CHLORIDE 0.9 % IV SOLN
3.0000 g | Freq: Once | INTRAVENOUS | Status: AC
  Administered 2015-01-07: 3 g via INTRAVENOUS
  Filled 2015-01-07: qty 6

## 2015-01-07 MED ORDER — KETOROLAC TROMETHAMINE 15 MG/ML IJ SOLN
15.0000 mg | Freq: Four times a day (QID) | INTRAMUSCULAR | Status: DC | PRN
  Administered 2015-01-07 – 2015-01-08 (×3): 15 mg via INTRAVENOUS
  Filled 2015-01-07 (×4): qty 1

## 2015-01-07 MED ORDER — ACETAMINOPHEN 500 MG PO TABS
1000.0000 mg | ORAL_TABLET | Freq: Four times a day (QID) | ORAL | Status: DC | PRN
  Administered 2015-01-09: 1000 mg via ORAL
  Filled 2015-01-07: qty 2

## 2015-01-07 MED ORDER — CALCIUM GLUCONATE 10 % IV SOLN
2.0000 g | Freq: Once | INTRAVENOUS | Status: AC
  Administered 2015-01-08: 2 g via INTRAVENOUS
  Filled 2015-01-07: qty 20

## 2015-01-07 MED ORDER — MORPHINE SULFATE (PF) 2 MG/ML IV SOLN
2.0000 mg | INTRAVENOUS | Status: DC | PRN
  Administered 2015-01-07: 2 mg via INTRAVENOUS
  Filled 2015-01-07: qty 1

## 2015-01-07 NOTE — Progress Notes (Signed)
Westley Gambles, RN of bp

## 2015-01-07 NOTE — Progress Notes (Signed)
When rounding on pt. She complains of "feeling like something is wrong, I'm nauseated, I feel like I'm falling and dizzy". Noticed pt looked more flushed at this time. Checked pts BP 103/59 with a heart rate of 103. Called Dr. Georgianne Fick to notify of these changes and said he would round at 1700 and if still had concerns to call Dr. Ilda Basset who was in-house and aware of the case. Called Dr. Ilda Basset at this time who said he would order some labs on the pt and come see the patient. See new orders.

## 2015-01-07 NOTE — Progress Notes (Signed)
Calcium resulted at 6.2 down from 9.0 yesterday.  Likely some degree of hypoalbuminemia as well.  1g of calcium gluconate ordered, add on magnesium ordered will repleat magnesium if needed.  Repeat lytes and CBC, albumin to calculate corrected calcium at 1600.  Has percocet ordered should po tolerance improve and can D/C PCA at that time.

## 2015-01-07 NOTE — Progress Notes (Signed)
Pt was given Ativan at 2222 and Benadryl at 2132. VS were collected at 2310, her resting BP was 94/48 and pulse was 124. BP will be rechecked

## 2015-01-07 NOTE — Progress Notes (Signed)
Obstetric and Gynecology  Subjective  Feels fairly sore this morning, hurts to take a deep breath at site of RUQ incision.  No fevers, no chills.   Pain 8/10 currently Objective   Filed Vitals:   01/07/15 0401 01/07/15 0426  BP: 111/58   Pulse: 103   Temp: 98.4 F (36.9 C)   Resp: 30 33      Intake/Output Summary (Last 24 hours) at 01/07/15 0736 Last data filed at 01/07/15 0400  Gross per 24 hour  Intake   5605 ml  Output   2350 ml  Net   3255 ml    350 urine output since coming to floor around midnight  General: NAD Cardiovascular: slightly tachy, no adventitious heart sounds Abdomen: hypoactive BS, soft, mildly distended, tympanic, appropriately tender around incision sites which are D/C/I Extremities: Trace BLE edema, SCD's in place  Labs: Results for orders placed or performed during the hospital encounter of 01/06/15 (from the past 24 hour(s))  Comprehensive metabolic panel     Status: Abnormal   Collection Time: 01/06/15  4:35 PM  Result Value Ref Range   Sodium 135 135 - 145 mmol/L   Potassium 2.7 (LL) 3.5 - 5.1 mmol/L   Chloride 104 101 - 111 mmol/L   CO2 23 22 - 32 mmol/L   Glucose, Bld 109 (H) 65 - 99 mg/dL   BUN 15 6 - 20 mg/dL   Creatinine, Ser 0.63 0.44 - 1.00 mg/dL   Calcium 9.0 8.9 - 10.3 mg/dL   Total Protein 7.2 6.5 - 8.1 g/dL   Albumin 4.0 3.5 - 5.0 g/dL   AST 16 15 - 41 U/L   ALT 14 14 - 54 U/L   Alkaline Phosphatase 52 38 - 126 U/L   Total Bilirubin 0.4 0.3 - 1.2 mg/dL   GFR calc non Af Amer >60 >60 mL/min   GFR calc Af Amer >60 >60 mL/min   Anion gap 8 5 - 15  CBC WITH DIFFERENTIAL     Status: Abnormal   Collection Time: 01/06/15  4:35 PM  Result Value Ref Range   WBC 17.3 (H) 3.6 - 11.0 K/uL   RBC 4.12 3.80 - 5.20 MIL/uL   Hemoglobin 12.9 12.0 - 16.0 g/dL   HCT 38.1 35.0 - 47.0 %   MCV 92.5 80.0 - 100.0 fL   MCH 31.2 26.0 - 34.0 pg   MCHC 33.8 32.0 - 36.0 g/dL   RDW 12.6 11.5 - 14.5 %   Platelets 197 150 - 440 K/uL   Neutrophils  Relative % 58 %   Neutro Abs 10.2 (H) 1.4 - 6.5 K/uL   Lymphocytes Relative 32 %   Lymphs Abs 5.6 (H) 1.0 - 3.6 K/uL   Monocytes Relative 8 %   Monocytes Absolute 1.3 (H) 0.2 - 0.9 K/uL   Eosinophils Relative 1 %   Eosinophils Absolute 0.2 0 - 0.7 K/uL   Basophils Relative 1 %   Basophils Absolute 0.1 0 - 0.1 K/uL  hCG, quantitative, pregnancy     Status: Abnormal   Collection Time: 01/06/15  4:35 PM  Result Value Ref Range   hCG, Beta Chain, Quant, S 30548 (H) <5 mIU/mL  Lipase, blood     Status: None   Collection Time: 01/06/15  4:35 PM  Result Value Ref Range   Lipase 35 11 - 51 U/L  Urinalysis complete, with microscopic ([facility] only)     Status: Abnormal   Collection Time: 01/06/15  4:35 PM  Result Value  Ref Range   Color, Urine YELLOW (A) YELLOW   APPearance CLEAR (A) CLEAR   Glucose, UA NEGATIVE NEGATIVE mg/dL   Bilirubin Urine NEGATIVE NEGATIVE   Ketones, ur NEGATIVE NEGATIVE mg/dL   Specific Gravity, Urine 1.016 1.005 - 1.030   Hgb urine dipstick NEGATIVE NEGATIVE   pH 5.0 5.0 - 8.0   Protein, ur NEGATIVE NEGATIVE mg/dL   Nitrite NEGATIVE NEGATIVE   Leukocytes, UA TRACE (A) NEGATIVE   RBC / HPF NONE SEEN 0 - 5 RBC/hpf   WBC, UA 0-5 0 - 5 WBC/hpf   Bacteria, UA RARE (A) NONE SEEN   Squamous Epithelial / LPF 0-5 (A) NONE SEEN   Mucous PRESENT   Type and screen [facility]     Status: None (Preliminary result)   Collection Time: 01/06/15  4:36 PM  Result Value Ref Range   ABO/RH(D) A POS    Antibody Screen NEG    Sample Expiration 01/09/2015    Unit Number [id number]    Blood Component Type RED CELLS,LR    Unit division 00    Status of Unit REL FROM [facility]    Transfusion Status PENDING    Crossmatch Result PENDING    Unit Number [id number]    Blood Component Type RED CELLS,LR    Unit division 00    Status of Unit ISSUED    Transfusion Status OK TO TRANSFUSE    Crossmatch Result Compatible    Unit Number [id number]    Blood  Component Type RED CELLS,LR    Unit division 00    Status of Unit ISSUED    Transfusion Status OK TO TRANSFUSE    Crossmatch Result Compatible    Unit Number [id number]    Blood Component Type RED CELLS,LR    Unit division 00    Status of Unit ALLOCATED    Transfusion Status OK TO TRANSFUSE    Crossmatch Result Compatible    Unit Number [id number]    Blood Component Type RBC, LR IRR    Unit division 00    Status of Unit ALLOCATED    Transfusion Status OK TO TRANSFUSE    Crossmatch Result Compatible   Prepare RBC     Status: None   Collection Time: 01/06/15  4:36 PM  Result Value Ref Range   Order Confirmation ORDER PROCESSED BY BLOOD BANK   ABO/Rh     Status: None   Collection Time: 01/06/15  4:37 PM  Result Value Ref Range   ABO/RH(D) A POS   Prepare RBC     Status: None   Collection Time: 01/06/15  6:30 PM  Result Value Ref Range   Order Confirmation ORDER PROCESSED BY BLOOD BANK   APTT     Status: None   Collection Time: 01/06/15  9:53 PM  Result Value Ref Range   aPTT 26 24 - 36 seconds  Protime-INR     Status: Abnormal   Collection Time: 01/06/15  9:53 PM  Result Value Ref Range   Prothrombin Time 17.0 (H) 11.4 - 15.0 seconds   INR 1.37   Fibrinogen     Status: Abnormal   Collection Time: 01/06/15  9:53 PM  Result Value Ref Range   Fibrinogen 194 (L) 210 - 470 mg/dL  Hemoglobin and hematocrit, blood     Status: Abnormal   Collection Time: 01/06/15  9:53 PM  Result Value Ref Range   Hemoglobin 8.0 (L) 12.0 - 16.0 g/dL   HCT 24.9 (L) 35.0 -  47.0 %  CBC     Status: Abnormal   Collection Time: 01/07/15  6:44 AM  Result Value Ref Range   WBC 15.2 (H) 3.6 - 11.0 K/uL   RBC 2.89 (L) 3.80 - 5.20 MIL/uL   Hemoglobin 8.6 (L) 12.0 - 16.0 g/dL   HCT 25.9 (L) 35.0 - 47.0 %   MCV 89.8 80.0 - 100.0 fL   MCH 29.8 26.0 - 34.0 pg   MCHC 33.2 32.0 - 36.0 g/dL   RDW 14.7 (H) 11.5 - 14.5 %   Platelets 117 (L) 150 - 440 K/uL    Cultures: No results found for this or  any previous visit.  Imaging:  Assessment   29 y.o. [id number] POD#1 laparoscopic right salpingectomy and partial right cornual resection for ruptured right cornual ectopic  Plan  - morphine 2mg  IV bolus on top of PCA - Hgb 12.5 to 8.0 postop now 8.6 after receiving second unit of pRBC postop - Advance po once nausea better controlled - continue foley for now - BMP pending to check renal function given hypotensive in ER and degree of blood loss, currently on toradol if Cr bump stop NSAIDS

## 2015-01-07 NOTE — Progress Notes (Addendum)
Gynecology Progress Note  Admission Date: 01/06/2015 Current Date: 01/07/2015  Kellie Davis is a 29 y.o. [id number] HD#2/POD#1 s/p l/s right salpingectomy and cornual resxn for ruptured ectopic. PMHx significant for anxiety/depression, hypothyroidism   ROS and patient/family/surgical history, located on admission H&P note dated 01/06/2015, have been reviewed, and there are no changes except as noted below Yesterday/Overnight Events:  n/a  Subjective:  CTSP for flushing feeling, increased RR.  Patient states that she has discomfort with taking normal breaths and that she feels the discomfort in the right upper quadrant/low right chest area.  No chest pain or SOB, per se. Per RN, no VB. Patient having some nausea w/o vomiting  Objective:    Patient Vitals for the past 12 hrs:  BP Temp Temp src Pulse Resp SpO2  01/07/15 1450 (!) 102/51 mmHg - - (!) 103 (!) 29 -  01/07/15 1236 - - - - (!) 29 97 %  01/07/15 1112 110/65 mmHg 98.2 F (36.8 C) Oral 99 (!) 22 -  01/07/15 0853 - - - - (!) 26 97 %  01/07/15 0755 111/66 mmHg 98 F (36.7 C) Oral (!) 107 18 97 %  01/07/15 0426 - - - - (!) 33 -  01/07/15 0401 (!) 111/58 mmHg 98.4 F (36.9 C) Oral (!) 103 (!) 30 98 %   UOP: approx 35-35mL/hr and stable Patient is on RA  Physical exam: Gen: Anxious appearing CTAB RRR no MRGs, slightly tachy into the 100s-110s Abdomen: obese, no BS, soft, nttp, c/d/i incisions with dermabond in place SCDs in place No VB on pad or bed. Foley just emptyed but about 45mL clear UOP in it   Medications Current Facility-Administered Medications  Medication Dose Route Frequency Provider Last Rate Last Dose  . 0.9 %  sodium chloride infusion   Intravenous Continuous [name], MD 125 mL/hr at 01/07/15 1101    . acetaminophen (TYLENOL) tablet 1,000 mg  1,000 mg Oral Q6H PRN Aletha Halim, MD      . ketorolac (TORADOL) 15 MG/ML injection 15 mg  15 mg Intravenous Q6H PRN Aletha Halim, MD      .  menthol-cetylpyridinium (CEPACOL) lozenge 3 mg  1 lozenge Oral Q2H PRN [name], MD      . morphine 2 MG/ML injection 2 mg  2 mg Intravenous Q2H PRN [name], MD   2 mg at 01/07/15 0835  . ondansetron (ZOFRAN) injection 4 mg  4 mg Intravenous Q6H PRN Aletha Halim, MD      . ondansetron [facility]) tablet 4 mg  4 mg Oral Q8H PRN Aletha Halim, MD         Recent Labs Lab 01/06/15 1635 01/06/15 2153 01/07/15 0644  WBC 17.3*  --  15.2*  HGB 12.9 8.0* 8.6*  HCT 38.1 24.9* 25.9*  PLT 197  --  117*     Recent Labs Lab 01/06/15 1635 01/07/15 0644  NA 135 133*  K 2.7* 4.2  CL 104 107  CO2 23 22  BUN 15 13  CREATININE 0.63 0.69  CALCIUM 9.0 6.6*  PROT 7.2  --   BILITOT 0.4  --   ALKPHOS 52  --   ALT 14  --   AST 16  --   GLUCOSE 109* 144*    Radiology none  Assessment & Plan:  Patient stable *GYN -will rule out in order of severity: repeat CBC and BMP with Mg ordered. ECG and 2v CXR also ordered. Reassuring that UOP is stable.  -Repeat quant  ordered with labs for now *Pulm: PE must be considered but based on ROS and exam, along with normal RR in room when talking with patient, I told her that I believe that it's most likely anxiety based, which is okay given what she's been through -follow up ECG -follow 2v CXR *Pain: only used morphine PCA twice (7mg  total) during day shift. Running toradol d/c and put on as PRN. *FEN/GI: will do clears. Will decrease MIVF to 44mL/hr. F/u BMP. Zofran changed to compazine, as pt is at high risk for ileus given resolving hemoperitoneum *PPx: SCDs, IS instruction given to patient by me  Code Status: Full Code  Durene Romans MD [facility] Pager: [phone]

## 2015-01-07 NOTE — Progress Notes (Signed)
Saw patient updated on labs - 1 additional unit of pRBC - 3g of magnesium sulfate handing now and 2g of calcium gluconate - repeat labs in AM

## 2015-01-07 NOTE — Progress Notes (Addendum)
GYN Note Mg 3gm and Calcium gluconate 2gm ordered. CXR negative, preliminary ECG read unremarkable.  Durene Romans MD [facility]  Pager: [phone]

## 2015-01-08 ENCOUNTER — Inpatient Hospital Stay: Payer: BLUE CROSS/BLUE SHIELD

## 2015-01-08 ENCOUNTER — Encounter: Payer: Self-pay | Admitting: Radiology

## 2015-01-08 LAB — BASIC METABOLIC PANEL
ANION GAP: 5 (ref 5–15)
BUN: 8 mg/dL (ref 6–20)
CALCIUM: 7.7 mg/dL — AB (ref 8.9–10.3)
CO2: 24 mmol/L (ref 22–32)
Chloride: 110 mmol/L (ref 101–111)
Creatinine, Ser: 0.67 mg/dL (ref 0.44–1.00)
GFR calc Af Amer: >60 mL/min (ref >60)
GLUCOSE: 84 mg/dL (ref 65–99)
Potassium: 3.6 mmol/L (ref 3.5–5.1)
Sodium: 139 mmol/L (ref 135–145)

## 2015-01-08 LAB — CBC
HCT: 22.4 % — ABNORMAL LOW (ref 35.0–47.0)
HCT: 29.1 % — ABNORMAL LOW (ref 35.0–47.0)
HEMATOCRIT: 25.5 % — AB (ref 35.0–47.0)
HEMOGLOBIN: 7.4 g/dL — AB (ref 12.0–16.0)
Hemoglobin: 9.5 g/dL — ABNORMAL LOW (ref 12.0–16.0)
Hemoglobin: 8.6 g/dL — ABNORMAL LOW (ref 12.0–16.0)
MCH: 29.9 pg (ref 26.0–34.0)
MCH: 30.5 pg (ref 26.0–34.0)
MCH: 30.4 pg (ref 26.0–34.0)
MCHC: 32.9 g/dL (ref 32.0–36.0)
MCHC: 32.8 g/dL (ref 32.0–36.0)
MCHC: 33.6 g/dL (ref 32.0–36.0)
MCV: 90.8 fL (ref 80.0–100.0)
MCV: 92.9 fL (ref 80.0–100.0)
MCV: 90.4 fL (ref 80.0–100.0)
PLATELETS: 115 10*3/uL — AB (ref 150–440)
Platelets: 101 10*3/uL — ABNORMAL LOW (ref 150–440)
Platelets: 123 10*3/uL — ABNORMAL LOW (ref 150–440)
RBC: 2.47 MIL/uL — ABNORMAL LOW (ref 3.80–5.20)
RBC: 3.13 MIL/uL — ABNORMAL LOW (ref 3.80–5.20)
RBC: 2.82 MIL/uL — ABNORMAL LOW (ref 3.80–5.20)
RDW: 14.3 % (ref 11.5–14.5)
RDW: 14.9 % — AB (ref 11.5–14.5)
RDW: 14.6 % — ABNORMAL HIGH (ref 11.5–14.5)
WBC: 15.1 10*3/uL — ABNORMAL HIGH (ref 3.6–11.0)
WBC: 15.3 10*3/uL — AB (ref 3.6–11.0)
WBC: 11.9 10*3/uL — ABNORMAL HIGH (ref 3.6–11.0)

## 2015-01-08 LAB — MAGNESIUM: Magnesium: 2 mg/dL (ref 1.7–2.4)

## 2015-01-08 LAB — SURGICAL PATHOLOGY

## 2015-01-08 MED ORDER — IOHEXOL 350 MG/ML SOLN
100.0000 mL | Freq: Once | INTRAVENOUS | Status: AC | PRN
  Administered 2015-01-08: 100 mL via INTRAVENOUS

## 2015-01-08 MED ORDER — AMMONIA AROMATIC IN INHA
RESPIRATORY_TRACT | Status: AC
  Filled 2015-01-08: qty 10

## 2015-01-08 MED ORDER — SIMETHICONE 80 MG PO CHEW
160.0000 mg | CHEWABLE_TABLET | Freq: Once | ORAL | Status: AC
  Administered 2015-01-08: 160 mg via ORAL
  Filled 2015-01-08: qty 2

## 2015-01-08 MED ORDER — ZOLPIDEM TARTRATE 5 MG PO TABS
5.0000 mg | ORAL_TABLET | Freq: Every evening | ORAL | Status: DC | PRN
  Administered 2015-01-10: 5 mg via ORAL
  Filled 2015-01-08: qty 1

## 2015-01-08 MED ORDER — HYDROMORPHONE HCL 1 MG/ML IJ SOLN: 1.0000 mg | INTRAMUSCULAR | Status: DC | PRN

## 2015-01-08 MED ORDER — CALCIUM GLUCONATE 10 % IV SOLN
1.0000 g | Freq: Once | INTRAVENOUS | Status: AC
  Administered 2015-01-08: 1 g via INTRAVENOUS
  Filled 2015-01-08: qty 10

## 2015-01-08 MED ORDER — IBUPROFEN 600 MG PO TABS
600.0000 mg | ORAL_TABLET | Freq: Four times a day (QID) | ORAL | Status: DC | PRN
  Administered 2015-01-08 – 2015-01-10 (×5): 600 mg via ORAL
  Filled 2015-01-08 (×5): qty 1

## 2015-01-08 MED ORDER — SODIUM CHLORIDE 0.9 % IV BOLUS (SEPSIS): 500.0000 mL | Freq: Once | INTRAVENOUS | Status: DC

## 2015-01-08 MED ORDER — SIMETHICONE 80 MG PO CHEW
80.0000 mg | CHEWABLE_TABLET | Freq: Two times a day (BID) | ORAL | Status: DC
  Administered 2015-01-08 – 2015-01-09 (×4): 80 mg via ORAL
  Filled 2015-01-08 (×4): qty 1

## 2015-01-08 MED ORDER — OXYCODONE HCL 5 MG PO TABS
5.0000 mg | ORAL_TABLET | Freq: Four times a day (QID) | ORAL | Status: DC | PRN
  Administered 2015-01-09 – 2015-01-10 (×3): 5 mg via ORAL
  Filled 2015-01-08: qty 2
  Filled 2015-01-08 (×2): qty 1

## 2015-01-08 NOTE — Progress Notes (Signed)
Notify Effie Berkshire, Rn of bp

## 2015-01-08 NOTE — Progress Notes (Signed)
Obstetric and Gynecology  Subjective  Feeling better, kept down half of a grilled cheese sandwich  Objective   Filed Vitals:   01/08/15 1658 01/08/15 1728  BP: 108/63   Pulse: 115   Temp: 98.6 F (37 C) 99.8 F (37.7 C)  Resp: 18      Intake/Output Summary (Last 24 hours) at 01/08/15 1805 Last data filed at 01/08/15 1408  Gross per 24 hour  Intake 2712.24 ml  Output   8400 ml  Net -5687.76 ml    General: NAD Cardiovascular: tachycardic Abdomen:NABS, soft, appropriately tender, incision D/C/I Extremities:SCD's  Labs: Results for orders placed or performed during the hospital encounter of 01/06/15 (from the past 24 hour(s))  CBC     Status: Abnormal   Collection Time: 01/08/15  2:28 AM  Result Value Ref Range   WBC 15.1 (H) 3.6 - 11.0 K/uL   RBC 2.47 (L) 3.80 - 5.20 MIL/uL   Hemoglobin 7.4 (L) 12.0 - 16.0 g/dL   HCT 22.4 (L) 35.0 - 47.0 %   MCV 90.8 80.0 - 100.0 fL   MCH 29.9 26.0 - 34.0 pg   MCHC 32.9 32.0 - 36.0 g/dL   RDW 14.3 11.5 - 14.5 %   Platelets 101 (L) 150 - 440 K/uL  CBC     Status: Abnormal   Collection Time: 01/08/15  6:30 AM  Result Value Ref Range   WBC 15.3 (H) 3.6 - 11.0 K/uL   RBC 3.13 (L) 3.80 - 5.20 MIL/uL   Hemoglobin 9.5 (L) 12.0 - 16.0 g/dL   HCT 29.1 (L) 35.0 - 47.0 %   MCV 92.9 80.0 - 100.0 fL   MCH 30.5 26.0 - 34.0 pg   MCHC 32.8 32.0 - 36.0 g/dL   RDW 14.9 (H) 11.5 - 14.5 %   Platelets 115 (L) 150 - 440 K/uL  Basic metabolic panel     Status: Abnormal   Collection Time: 01/08/15  6:30 AM  Result Value Ref Range   Sodium 139 135 - 145 mmol/L   Potassium 3.6 3.5 - 5.1 mmol/L   Chloride 110 101 - 111 mmol/L   CO2 24 22 - 32 mmol/L   Glucose, Bld 84 65 - 99 mg/dL   BUN 8 6 - 20 mg/dL   Creatinine, Ser 0.67 0.44 - 1.00 mg/dL   Calcium 7.7 (L) 8.9 - 10.3 mg/dL   GFR calc non Af Amer >60 >60 mL/min   GFR calc Af Amer >60 >60 mL/min   Anion gap 5 5 - 15  Magnesium     Status: None   Collection Time: 01/08/15  6:30 AM  Result  Value Ref Range   Magnesium 2.0 1.7 - 2.4 mg/dL  CBC     Status: Abnormal   Collection Time: 01/08/15  4:37 PM  Result Value Ref Range   WBC 11.9 (H) 3.6 - 11.0 K/uL   RBC 2.82 (L) 3.80 - 5.20 MIL/uL   Hemoglobin 8.6 (L) 12.0 - 16.0 g/dL   HCT 25.5 (L) 35.0 - 47.0 %   MCV 90.4 80.0 - 100.0 fL   MCH 30.4 26.0 - 34.0 pg   MCHC 33.6 32.0 - 36.0 g/dL   RDW 14.6 (H) 11.5 - 14.5 %   Platelets 123 (L) 150 - 440 K/uL    Cultures: No results found for this or any previous visit.  Imaging: Dg Chest 2 View  01/07/2015  CLINICAL DATA:  Nausea and mild tachycardia EXAM: CHEST  2 VIEW COMPARISON:  None.  FINDINGS: There is no edema or consolidation. The heart size and pulmonary vascularity are normal. No adenopathy. No bone lesions. IMPRESSION: No edema or consolidation. Electronically Signed   By: Lowella Grip III M.D.   On: 01/07/2015 17:23   Ct Angio Chest Pe W/cm &/or Wo Cm  01/08/2015  CLINICAL DATA:  Tachycardia.  Postop ruptured ectopic pregnancy. EXAM: CT ANGIOGRAPHY CHEST WITH CONTRAST TECHNIQUE: Multidetector CT imaging of the chest was performed using the standard protocol during bolus administration of intravenous contrast. Multiplanar CT image reconstructions and MIPs were obtained to evaluate the vascular anatomy. CONTRAST:  127mL OMNIPAQUE IOHEXOL 350 MG/ML SOLN COMPARISON:  Chest x-ray 01/07/2015 FINDINGS: No filling defects in the pulmonary arteries to suggest pulmonary emboli. There is dependent and bibasilar atelectasis. Low lung volumes. No effusions. Heart is normal size. Aorta is normal caliber. No mediastinal, hilar, or axillary adenopathy. Chest wall soft tissues are unremarkable. Bilateral breast implants noted. Imaging into the upper abdomen shows no acute findings. No acute bony abnormality. Review of the MIP images confirms the above findings. IMPRESSION: No evidence of pulmonary embolus. Low lung volumes.  Dependent and bibasilar atelectasis. Electronically Signed   By:  Rolm Baptise M.D.   On: 01/08/2015 13:34   US Ob Comp Less 14 Wks  01/06/2015  CLINICAL DATA:  Right lower quadrant pain. Gestational age by LMP of 6 weeks 5 days. EXAM: OBSTETRIC <14 WK [place] AND TRANSVAGINAL OB US TECHNIQUE: Both transabdominal and transvaginal ultrasound examinations were performed for complete evaluation of the gestation as well as the maternal uterus, adnexal regions, and pelvic cul-de-sac. Transvaginal technique was performed to assess early pregnancy. COMPARISON:  None. FINDINGS: No intrauterine gestational sac visualized. Both ovaries appear normal. There is a complex mass with peripheral hyperechoic echotexture and small central cystic area in the right adnexa, which appears separate from the right ovary. This measures 3.9 x 3.1 x 2.8 cm. There is also a moderate to large amount of complex free fluid within the pelvis in both adnexa and surrounding the uterus. IMPRESSION: No intrauterine gestational sac. 3.9 cm complex right adnexal mass which appears separate from the right ovary, with moderate to large amount of complex free fluid. This is suspicious for ruptured ectopic pregnancy. Critical Value/emergent results were called by telephone at the time of interpretation on 01/06/2015 at 6:16 pm to Dr. Loura Pardon , who verbally acknowledged these results. Electronically Signed   By: Earle Gell M.D.   On: 01/06/2015 18:19   US Ob Transvaginal  01/06/2015  CLINICAL DATA:  Right lower quadrant pain. Gestational age by LMP of 6 weeks 5 days. EXAM: OBSTETRIC <14 WK [place] AND TRANSVAGINAL OB US TECHNIQUE: Both transabdominal and transvaginal ultrasound examinations were performed for complete evaluation of the gestation as well as the maternal uterus, adnexal regions, and pelvic cul-de-sac. Transvaginal technique was performed to assess early pregnancy. COMPARISON:  None. FINDINGS: No intrauterine gestational sac visualized. Both ovaries appear normal. There is a complex mass with peripheral  hyperechoic echotexture and small central cystic area in the right adnexa, which appears separate from the right ovary. This measures 3.9 x 3.1 x 2.8 cm. There is also a moderate to large amount of complex free fluid within the pelvis in both adnexa and surrounding the uterus. IMPRESSION: No intrauterine gestational sac. 3.9 cm complex right adnexal mass which appears separate from the right ovary, with moderate to large amount of complex free fluid. This is suspicious for ruptured ectopic pregnancy. Critical Value/emergent results were called by  telephone at the time of interpretation on 01/06/2015 at 6:16 pm to Dr. Loura Pardon , who verbally acknowledged these results. Electronically Signed   By: Earle Gell M.D.   On: 01/06/2015 18:19    Assessment   29 y.o. [id number] POD#2 laparoscopic right cornual resection and right salpingectomy  Plan   1) Tachycardia - did not resolve after 522mL NS test bolus, her urine output remains great with clear urine in the foley.  She still feels somewhat week and dizzy when trying to ambulate to bathroom but this has also improved since yesterday.  EKG as previously mention did not show arrythmia yesterday.  Also do not feel infection or SIRS is possible etiology as WBC actually down to 11.9K.    2) Fevers - none over 100.4, two 100.1.  Suspect secondary to myometrial resection vs atelectasis as patient did not take deep breaths initially because of pain,  Some atelectasis evident on PE CT has been working on and doing better with incentive spirometry  3) Disposition - not meeting discharge criteria yet as not ambulating, discussed potential of removing foley and bedside comode

## 2015-01-08 NOTE — Progress Notes (Signed)
Obstetric and Gynecology  Subjective  Feeling better, feels compazine has worked better for nausea.  Some more flatus overnight.    Objective   Filed Vitals:   01/08/15 0300 01/08/15 0745  BP: 90/50 111/65  Pulse: 127 113  Temp: 98.4 F (36.9 C) 98.4 F (36.9 C)  Resp: 20 18   Tmax 100.1 at 20:15 on 01/07/16   Intake/Output Summary (Last 24 hours) at 01/08/15 0855 Last data filed at 01/08/15 [id number]  Gross per 24 hour  Intake 1180.76 ml  Output   6950 ml  Net -5769.24 ml    General: NAD Cardiovascular: remains slight tachycardic Abdomen: NABS, soft, mildly distended, tympanic to percussion, incision D/C/I Extremities: SCD in place  Labs: Results for orders placed or performed during the hospital encounter of 01/06/15 (from the past 24 hour(s))  CBC     Status: Abnormal   Collection Time: 01/07/15  3:59 PM  Result Value Ref Range   WBC 16.7 (H) 3.6 - 11.0 K/uL   RBC 2.45 (L) 3.80 - 5.20 MIL/uL   Hemoglobin 7.3 (L) 12.0 - 16.0 g/dL   HCT 22.2 (L) 35.0 - 47.0 %   MCV 90.4 80.0 - 100.0 fL   MCH 29.9 26.0 - 34.0 pg   MCHC 33.0 32.0 - 36.0 g/dL   RDW 14.9 (H) 11.5 - 14.5 %   Platelets 89 (L) 150 - 440 K/uL  Magnesium     Status: Abnormal   Collection Time: 01/07/15  3:59 PM  Result Value Ref Range   Magnesium 1.5 (L) 1.7 - 2.4 mg/dL  Basic metabolic panel     Status: Abnormal   Collection Time: 01/07/15  3:59 PM  Result Value Ref Range   Sodium 136 135 - 145 mmol/L   Potassium 3.7 3.5 - 5.1 mmol/L   Chloride 109 101 - 111 mmol/L   CO2 23 22 - 32 mmol/L   Glucose, Bld 94 65 - 99 mg/dL   BUN 13 6 - 20 mg/dL   Creatinine, Ser 0.67 0.44 - 1.00 mg/dL   Calcium 7.0 (L) 8.9 - 10.3 mg/dL   GFR calc non Af Amer >60 >60 mL/min   GFR calc Af Amer >60 >60 mL/min   Anion gap 4 (L) 5 - 15  hCG, quantitative, pregnancy     Status: Abnormal   Collection Time: 01/07/15  3:59 PM  Result Value Ref Range   hCG, Beta Chain, Quant, S 5566 (H) <5 mIU/mL  CBC     Status: Abnormal   Collection Time: 01/08/15  2:28 AM  Result Value Ref Range   WBC 15.1 (H) 3.6 - 11.0 K/uL   RBC 2.47 (L) 3.80 - 5.20 MIL/uL   Hemoglobin 7.4 (L) 12.0 - 16.0 g/dL   HCT 22.4 (L) 35.0 - 47.0 %   MCV 90.8 80.0 - 100.0 fL   MCH 29.9 26.0 - 34.0 pg   MCHC 32.9 32.0 - 36.0 g/dL   RDW 14.3 11.5 - 14.5 %   Platelets 101 (L) 150 - 440 K/uL  CBC     Status: Abnormal   Collection Time: 01/08/15  6:30 AM  Result Value Ref Range   WBC 15.3 (H) 3.6 - 11.0 K/uL   RBC 3.13 (L) 3.80 - 5.20 MIL/uL   Hemoglobin 9.5 (L) 12.0 - 16.0 g/dL   HCT 29.1 (L) 35.0 - 47.0 %   MCV 92.9 80.0 - 100.0 fL   MCH 30.5 26.0 - 34.0 pg   MCHC 32.8 32.0 - 36.0  g/dL   RDW 14.9 (H) 11.5 - 14.5 %   Platelets 115 (L) 150 - 440 K/uL  Basic metabolic panel     Status: Abnormal   Collection Time: 01/08/15  6:30 AM  Result Value Ref Range   Sodium 139 135 - 145 mmol/L   Potassium 3.6 3.5 - 5.1 mmol/L   Chloride 110 101 - 111 mmol/L   CO2 24 22 - 32 mmol/L   Glucose, Bld 84 65 - 99 mg/dL   BUN 8 6 - 20 mg/dL   Creatinine, Ser 0.67 0.44 - 1.00 mg/dL   Calcium 7.7 (L) 8.9 - 10.3 mg/dL   GFR calc non Af Amer >60 >60 mL/min   GFR calc Af Amer >60 >60 mL/min   Anion gap 5 5 - 15  Magnesium     Status: None   Collection Time: 01/08/15  6:30 AM  Result Value Ref Range   Magnesium 2.0 1.7 - 2.4 mg/dL    Cultures: No results found for this or any previous visit.  Imaging:  Assessment   29 y.o. [id number] POD#2 laparoscopic right cornual resection and salpingectomy for ruptured right ectopic  Plan   1) Acute blood loss anemia - Hgb trend 12.5 with 1 unit pRBC preop down to 8.0 postop 1 unit of pRBC with follow up of 8.5, 7.3 1 unit of packed red blood cells with rise to 9.5 - Hemodynamically stable although still mildy tachycardic.  EKG yesterday sinus tachycardia with non-specific ST abnormality (low magnesium and Ca at time of EKG).  Could be pain related, respiratory rate has improved and sating normally on O2.  Will  monitor.   - Ferrous sulfate po to start outpatient once has established normal bowl function - Patient did have slight bump in INR, drop in fibrinogen and platelets but not significant enough to meet criteria for DIC  2) Pain control - switch to all po analgesics today  3) FEN - NS at 13mL/hr will discontinue once voided, foley discontinued today  4) Elevated temperature - Tmax 100.1, suspect atelectasis as patient was not taking very deep breaths yesterday because of pain from her RUQ incision.  Given cornual resection expect may also be similar to postop fevers seen after myomectomy  5) Hypocalcemia - hypomagnesemia resolved, will administer 1 additional gram of calcium gluconate  6) Disposition - possibly home later today

## 2015-01-08 NOTE — Progress Notes (Signed)
Obstetric and Gynecology  Subjective  Other than continued tachycardia patient is doing well, pain adequately controlled on po analgesics.  Has tolerated Jello is getting to try a sandwich for lunch.  No emesis, passing flatus  Objective   Filed Vitals:   01/08/15 1108 01/08/15 1218  BP: 98/51 109/61  Pulse: 115 117  Temp: 100.1 F (37.8 C) 99.9 F (37.7 C)  Resp:       Intake/Output Summary (Last 24 hours) at 01/08/15 1429 Last data filed at 01/08/15 1408  Gross per 24 hour  Intake 1179.76 ml  Output   8950 ml  Net -7770.24 ml    General: NAD Cardiovascular: tachycarida, no adventitious heart sounds appreciated Abdomen: NABS, soft, appropriately tender, mildly distended and tympanic Extremities: trace BLE edema  Labs: Results for orders placed or performed during the hospital encounter of 01/06/15 (from the past 24 hour(s))  CBC     Status: Abnormal   Collection Time: 01/07/15  3:59 PM  Result Value Ref Range   WBC 16.7 (H) 3.6 - 11.0 K/uL   RBC 2.45 (L) 3.80 - 5.20 MIL/uL   Hemoglobin 7.3 (L) 12.0 - 16.0 g/dL   HCT 22.2 (L) 35.0 - 47.0 %   MCV 90.4 80.0 - 100.0 fL   MCH 29.9 26.0 - 34.0 pg   MCHC 33.0 32.0 - 36.0 g/dL   RDW 14.9 (H) 11.5 - 14.5 %   Platelets 89 (L) 150 - 440 K/uL  Magnesium     Status: Abnormal   Collection Time: 01/07/15  3:59 PM  Result Value Ref Range   Magnesium 1.5 (L) 1.7 - 2.4 mg/dL  Basic metabolic panel     Status: Abnormal   Collection Time: 01/07/15  3:59 PM  Result Value Ref Range   Sodium 136 135 - 145 mmol/L   Potassium 3.7 3.5 - 5.1 mmol/L   Chloride 109 101 - 111 mmol/L   CO2 23 22 - 32 mmol/L   Glucose, Bld 94 65 - 99 mg/dL   BUN 13 6 - 20 mg/dL   Creatinine, Ser 0.67 0.44 - 1.00 mg/dL   Calcium 7.0 (L) 8.9 - 10.3 mg/dL   GFR calc non Af Amer >60 >60 mL/min   GFR calc Af Amer >60 >60 mL/min   Anion gap 4 (L) 5 - 15  hCG, quantitative, pregnancy     Status: Abnormal   Collection Time: 01/07/15  3:59 PM  Result Value  Ref Range   hCG, Beta Chain, Quant, S 5566 (H) <5 mIU/mL  CBC     Status: Abnormal   Collection Time: 01/08/15  2:28 AM  Result Value Ref Range   WBC 15.1 (H) 3.6 - 11.0 K/uL   RBC 2.47 (L) 3.80 - 5.20 MIL/uL   Hemoglobin 7.4 (L) 12.0 - 16.0 g/dL   HCT 22.4 (L) 35.0 - 47.0 %   MCV 90.8 80.0 - 100.0 fL   MCH 29.9 26.0 - 34.0 pg   MCHC 32.9 32.0 - 36.0 g/dL   RDW 14.3 11.5 - 14.5 %   Platelets 101 (L) 150 - 440 K/uL  CBC     Status: Abnormal   Collection Time: 01/08/15  6:30 AM  Result Value Ref Range   WBC 15.3 (H) 3.6 - 11.0 K/uL   RBC 3.13 (L) 3.80 - 5.20 MIL/uL   Hemoglobin 9.5 (L) 12.0 - 16.0 g/dL   HCT 29.1 (L) 35.0 - 47.0 %   MCV 92.9 80.0 - 100.0 fL   MCH  30.5 26.0 - 34.0 pg   MCHC 32.8 32.0 - 36.0 g/dL   RDW 14.9 (H) 11.5 - 14.5 %   Platelets 115 (L) 150 - 440 K/uL  Basic metabolic panel     Status: Abnormal   Collection Time: 01/08/15  6:30 AM  Result Value Ref Range   Sodium 139 135 - 145 mmol/L   Potassium 3.6 3.5 - 5.1 mmol/L   Chloride 110 101 - 111 mmol/L   CO2 24 22 - 32 mmol/L   Glucose, Bld 84 65 - 99 mg/dL   BUN 8 6 - 20 mg/dL   Creatinine, Ser 0.67 0.44 - 1.00 mg/dL   Calcium 7.7 (L) 8.9 - 10.3 mg/dL   GFR calc non Af Amer >60 >60 mL/min   GFR calc Af Amer >60 >60 mL/min   Anion gap 5 5 - 15  Magnesium     Status: None   Collection Time: 01/08/15  6:30 AM  Result Value Ref Range   Magnesium 2.0 1.7 - 2.4 mg/dL    Cultures: No results found for this or any previous visit.  Imaging: Dg Chest 2 View  01/07/2015  CLINICAL DATA:  Nausea and mild tachycardia EXAM: CHEST  2 VIEW COMPARISON:  None. FINDINGS: There is no edema or consolidation. The heart size and pulmonary vascularity are normal. No adenopathy. No bone lesions. IMPRESSION: No edema or consolidation. Electronically Signed   By: Lowella Grip III M.D.   On: 01/07/2015 17:23   Ct Angio Chest Pe W/cm &/or Wo Cm  01/08/2015  CLINICAL DATA:  Tachycardia.  Postop ruptured ectopic pregnancy.  EXAM: CT ANGIOGRAPHY CHEST WITH CONTRAST TECHNIQUE: Multidetector CT imaging of the chest was performed using the standard protocol during bolus administration of intravenous contrast. Multiplanar CT image reconstructions and MIPs were obtained to evaluate the vascular anatomy. CONTRAST:  171mL OMNIPAQUE IOHEXOL 350 MG/ML SOLN COMPARISON:  Chest x-ray 01/07/2015 FINDINGS: No filling defects in the pulmonary arteries to suggest pulmonary emboli. There is dependent and bibasilar atelectasis. Low lung volumes. No effusions. Heart is normal size. Aorta is normal caliber. No mediastinal, hilar, or axillary adenopathy. Chest wall soft tissues are unremarkable. Bilateral breast implants noted. Imaging into the upper abdomen shows no acute findings. No acute bony abnormality. Review of the MIP images confirms the above findings. IMPRESSION: No evidence of pulmonary embolus. Low lung volumes.  Dependent and bibasilar atelectasis. Electronically Signed   By: Rolm Baptise M.D.   On: 01/08/2015 13:34   Assessment   29 y.o. G1P1001 POD2 laparoscopic right cornual resection and salpingectomy for ruptured ectopic  Plan    1) Tachycardia - is still anemic at 9.5 but I would expect improvement in tachycardia at this level.  EKG showed sinus tach yesterday.  Electrolyte abnormalities have been corrected.  PE CT this afternoon negative for PE.  Does not appear overly anxious.  Discussed that we have ruled out serious or life threatening etiologies of tachycardia at present  2) Low grade temps - no temp over 100.4 had one additional Tmax to 100.1 this morning at 11:00.  WBC is stable around 15K appropriate for postop, most likely related to cornual resection itself similar to myomectomy.  3) Recheck CBC this afternoon and verify patient did ok with lunch and has ambulated.

## 2015-01-09 LAB — CBC
HCT: 26.2 % — ABNORMAL LOW (ref 35.0–47.0)
HEMOGLOBIN: 8.6 g/dL — AB (ref 12.0–16.0)
MCH: 30.4 pg (ref 26.0–34.0)
MCHC: 32.9 g/dL (ref 32.0–36.0)
MCV: 92.1 fL (ref 80.0–100.0)
PLATELETS: 135 10*3/uL — AB (ref 150–440)
RBC: 2.84 MIL/uL — AB (ref 3.80–5.20)
RDW: 14.2 % (ref 11.5–14.5)
WBC: 9.7 10*3/uL (ref 3.6–11.0)

## 2015-01-09 LAB — BASIC METABOLIC PANEL
ANION GAP: 6 (ref 5–15)
BUN: 10 mg/dL (ref 6–20)
CHLORIDE: 108 mmol/L (ref 101–111)
CO2: 27 mmol/L (ref 22–32)
Calcium: 8.1 mg/dL — ABNORMAL LOW (ref 8.9–10.3)
Creatinine, Ser: 0.64 mg/dL (ref 0.44–1.00)
Glucose, Bld: 105 mg/dL — ABNORMAL HIGH (ref 65–99)
POTASSIUM: 3.4 mmol/L — AB (ref 3.5–5.1)
SODIUM: 141 mmol/L (ref 135–145)

## 2015-01-09 LAB — MAGNESIUM: MAGNESIUM: 2 mg/dL (ref 1.7–2.4)

## 2015-01-09 MED ORDER — IBUPROFEN 600 MG PO TABS: 600.0000 mg | ORAL_TABLET | Freq: Four times a day (QID) | ORAL | Status: DC | PRN

## 2015-01-09 MED ORDER — FERROUS SULFATE 325 (65 FE) MG PO TABS: 325.0000 mg | ORAL_TABLET | Freq: Two times a day (BID) | ORAL | Status: DC

## 2015-01-09 MED ORDER — OXYCODONE-ACETAMINOPHEN 5-325 MG PO TABS: 1.0000 | ORAL_TABLET | Freq: Four times a day (QID) | ORAL | Status: DC | PRN

## 2015-01-09 MED ORDER — MAGNESIUM HYDROXIDE 400 MG/5ML PO SUSP: 5.0000 mL | Freq: Every day | ORAL | Status: DC

## 2015-01-09 MED ORDER — DOCUSATE SODIUM 100 MG PO CAPS: 100.0000 mg | ORAL_CAPSULE | Freq: Two times a day (BID) | ORAL | Status: DC

## 2015-01-09 NOTE — Progress Notes (Signed)
Patient was seen and asisted through the day by nursing.   She continues to feel "burning" in her low abdomen/pelvis, and feels unsteady when she walks.  She has had a BM, tolerating regular diet, voided after foley, and is otherwise asymptomatic from her acute blood loss and surgery.    I walked with her around the nursing station, and she definitely does not have her strength.  Mainly her right leg, likely from manipulation during surgery on that side.    She will stay another night here and be evaluated by PT tomorrow.   Dr. Georgianne Fick has been notified.    Continue routine post op inpatient cares.   ----- Larey Days, MD Attending Obstetrician and Gynecologist [facility]

## 2015-01-09 NOTE — Discharge Summary (Signed)
Physician Discharge Summary  Patient ID: Kellie Davis MRN: [id number] DOB/AGE: 31-Aug-1985 29 y.o.  Admit date: 01/06/2015 Discharge date: 01/10/2015  Admission Diagnoses: Ruptured ectopic pregnancy in tube and cornual region of right fallopian tube  Discharge Diagnoses:  Active Problems:   Ruptured ectopic pregnancy   Sinus tachycardia (HCC)   Hypocalcemia   Acute blood loss anemia   Hypomagnesemia   Ectopic pregnancy, tubal   Discharged Condition: stable  Hospital Course: Patient presented to the ER on 01/06/2015 with sudden onset of abdominal pain in the setting of early pregnancy, normally rising BHCG obtained in the past 1.5 weeks but below ultrasound discriminatory zone.  During course of evaluation patient became hypotensive, ultrasound demonstrated right adnexal mass and large amount of complex free fluid in the pelvis.  Admission Hgb was 12.5 the patient received 1 unit of emergency release blood in the ER and was taken emergently to the OR.  The patient underwent laparoscopic right cornual resection and salpingectomy for ruptured cornual ectopic with evacuation of 2430mL of hemoperitoneum.  Postoperative Hgb was 8.0, fibrinogen 197 and INR of 1.37, with platelets nadir at 89.    Postoperative day 1 Hgb was 8.6, repeat later that afternoon was 7.3.  The patient received 1 additional unit of packed red blood cells for follow up Hgb of 7.3 with continued tachycardia the equalized at Hgb of 8.6 on subsequent recheck.  The patient was oxygenating well on room air but had elevated respiratory rate, which was attributed to pain with deep inspiration.  EKG showed sinus tachycardia non-specific ST changes, Calcium 6.6, was repleted along with magnesium.  Chest X-ray was negative, and incentive spirometry was started.  Respiratory rate improved tachycardia remained despite good urine output and improvement in Hgb.  PE CT was obtained to rule out PE as possible etiology for patients shortness of  breath on POD2 and was negative.  The patient was maintained on SCD throughout admission.  The patient was meeting all postoperative goals by POD#4 including reporting adequate pain control on po analgesics, tolerating a general diet, voiding spontaneously, and showing a resolution in her tachycardia and electrolyte abnormalities. She was seen by PT on POD#4 for reported right leg weakness and instability and was cleared for discharge with no further PT recommendations.   Consults: Physical therapy  Significant Diagnostic Studies:  Results for orders placed or performed during the hospital encounter of 01/06/15 (from the past 72 hour(s))  CBC     Status: Abnormal   Collection Time: 01/07/15  3:59 PM  Result Value Ref Range   WBC 16.7 (H) 3.6 - 11.0 K/uL   RBC 2.45 (L) 3.80 - 5.20 MIL/uL   Hemoglobin 7.3 (L) 12.0 - 16.0 g/dL   HCT 22.2 (L) 35.0 - 47.0 %   MCV 90.4 80.0 - 100.0 fL   MCH 29.9 26.0 - 34.0 pg   MCHC 33.0 32.0 - 36.0 g/dL   RDW 14.9 (H) 11.5 - 14.5 %   Platelets 89 (L) 150 - 440 K/uL    Comment: PLATELET COUNT CONFIRMED BY SMEAR  Magnesium     Status: Abnormal   Collection Time: 01/07/15  3:59 PM  Result Value Ref Range   Magnesium 1.5 (L) 1.7 - 2.4 mg/dL  Basic metabolic panel     Status: Abnormal   Collection Time: 01/07/15  3:59 PM  Result Value Ref Range   Sodium 136 135 - 145 mmol/L   Potassium 3.7 3.5 - 5.1 mmol/L   Chloride 109 101 -  111 mmol/L   CO2 23 22 - 32 mmol/L   Glucose, Bld 94 65 - 99 mg/dL   BUN 13 6 - 20 mg/dL   Creatinine, Ser 0.67 0.44 - 1.00 mg/dL   Calcium 7.0 (L) 8.9 - 10.3 mg/dL   GFR calc non Af Amer >60 >60 mL/min   GFR calc Af Amer >60 >60 mL/min    Comment: (NOTE) The eGFR has been calculated using the CKD EPI equation. This calculation has not been validated in all clinical situations. eGFR's persistently <60 mL/min signify possible Chronic Kidney Disease.    Anion gap 4 (L) 5 - 15  hCG, quantitative, pregnancy     Status: Abnormal    Collection Time: 01/07/15  3:59 PM  Result Value Ref Range   hCG, Beta Chain, Quant, S 5566 (H) <5 mIU/mL    Comment:          GEST. AGE      CONC.  (mIU/mL)   <=1 WEEK        5 - 50     2 WEEKS       50 - 500     3 WEEKS       100 - 10,000     4 WEEKS     1,000 - 30,000     5 WEEKS     3,500 - 115,000   6-8 WEEKS     12,000 - 270,000    12 WEEKS     15,000 - 220,000        FEMALE AND NON-PREGNANT FEMALE:     LESS THAN 5 mIU/mL   CBC     Status: Abnormal   Collection Time: 01/08/15  2:28 AM  Result Value Ref Range   WBC 15.1 (H) 3.6 - 11.0 K/uL   RBC 2.47 (L) 3.80 - 5.20 MIL/uL   Hemoglobin 7.4 (L) 12.0 - 16.0 g/dL   HCT 22.4 (L) 35.0 - 47.0 %   MCV 90.8 80.0 - 100.0 fL   MCH 29.9 26.0 - 34.0 pg   MCHC 32.9 32.0 - 36.0 g/dL   RDW 14.3 11.5 - 14.5 %   Platelets 101 (L) 150 - 440 K/uL  CBC     Status: Abnormal   Collection Time: 01/08/15  6:30 AM  Result Value Ref Range   WBC 15.3 (H) 3.6 - 11.0 K/uL   RBC 3.13 (L) 3.80 - 5.20 MIL/uL   Hemoglobin 9.5 (L) 12.0 - 16.0 g/dL    Comment: RESULT REPEATED AND VERIFIED   HCT 29.1 (L) 35.0 - 47.0 %   MCV 92.9 80.0 - 100.0 fL   MCH 30.5 26.0 - 34.0 pg   MCHC 32.8 32.0 - 36.0 g/dL   RDW 14.9 (H) 11.5 - 14.5 %   Platelets 115 (L) 150 - 440 K/uL  Basic metabolic panel     Status: Abnormal   Collection Time: 01/08/15  6:30 AM  Result Value Ref Range   Sodium 139 135 - 145 mmol/L   Potassium 3.6 3.5 - 5.1 mmol/L   Chloride 110 101 - 111 mmol/L   CO2 24 22 - 32 mmol/L   Glucose, Bld 84 65 - 99 mg/dL   BUN 8 6 - 20 mg/dL   Creatinine, Ser 0.67 0.44 - 1.00 mg/dL   Calcium 7.7 (L) 8.9 - 10.3 mg/dL   GFR calc non Af Amer >60 >60 mL/min   GFR calc Af Amer >60 >60 mL/min    Comment: (NOTE) The eGFR  has been calculated using the CKD EPI equation. This calculation has not been validated in all clinical situations. eGFR's persistently <60 mL/min signify possible Chronic Kidney Disease.    Anion gap 5 5 - 15  Magnesium     Status: None    Collection Time: 01/08/15  6:30 AM  Result Value Ref Range   Magnesium 2.0 1.7 - 2.4 mg/dL  CBC     Status: Abnormal   Collection Time: 01/08/15  4:37 PM  Result Value Ref Range   WBC 11.9 (H) 3.6 - 11.0 K/uL   RBC 2.82 (L) 3.80 - 5.20 MIL/uL   Hemoglobin 8.6 (L) 12.0 - 16.0 g/dL   HCT 25.5 (L) 35.0 - 47.0 %   MCV 90.4 80.0 - 100.0 fL   MCH 30.4 26.0 - 34.0 pg   MCHC 33.6 32.0 - 36.0 g/dL   RDW 14.6 (H) 11.5 - 14.5 %   Platelets 123 (L) 150 - 440 K/uL  CBC     Status: Abnormal   Collection Time: 01/09/15  6:58 AM  Result Value Ref Range   WBC 9.7 3.6 - 11.0 K/uL   RBC 2.84 (L) 3.80 - 5.20 MIL/uL   Hemoglobin 8.6 (L) 12.0 - 16.0 g/dL   HCT 26.2 (L) 35.0 - 47.0 %   MCV 92.1 80.0 - 100.0 fL   MCH 30.4 26.0 - 34.0 pg   MCHC 32.9 32.0 - 36.0 g/dL   RDW 14.2 11.5 - 14.5 %   Platelets 135 (L) 150 - 440 K/uL  Basic metabolic panel     Status: Abnormal   Collection Time: 01/09/15  6:58 AM  Result Value Ref Range   Sodium 141 135 - 145 mmol/L   Potassium 3.4 (L) 3.5 - 5.1 mmol/L   Chloride 108 101 - 111 mmol/L   CO2 27 22 - 32 mmol/L   Glucose, Bld 105 (H) 65 - 99 mg/dL   BUN 10 6 - 20 mg/dL   Creatinine, Ser 0.64 0.44 - 1.00 mg/dL   Calcium 8.1 (L) 8.9 - 10.3 mg/dL   GFR calc non Af Amer >60 >60 mL/min   GFR calc Af Amer >60 >60 mL/min    Comment: (NOTE) The eGFR has been calculated using the CKD EPI equation. This calculation has not been validated in all clinical situations. eGFR's persistently <60 mL/min signify possible Chronic Kidney Disease.    Anion gap 6 5 - 15  Magnesium     Status: None   Collection Time: 01/09/15  6:58 AM  Result Value Ref Range   Magnesium 2.0 1.7 - 2.4 mg/dL  CBC     Status: Abnormal   Collection Time: 01/10/15  5:58 AM  Result Value Ref Range   WBC 9.1 3.6 - 11.0 K/uL   RBC 2.79 (L) 3.80 - 5.20 MIL/uL   Hemoglobin 8.5 (L) 12.0 - 16.0 g/dL   HCT 25.5 (L) 35.0 - 47.0 %   MCV 91.5 80.0 - 100.0 fL   MCH 30.6 26.0 - 34.0 pg   MCHC 33.5  32.0 - 36.0 g/dL   RDW 13.9 11.5 - 14.5 %   Platelets 151 150 - 440 K/uL   Dg Chest 2 View  01/07/2015  CLINICAL DATA:  Nausea and mild tachycardia EXAM: CHEST  2 VIEW COMPARISON:  None. FINDINGS: There is no edema or consolidation. The heart size and pulmonary vascularity are normal. No adenopathy. No bone lesions. IMPRESSION: No edema or consolidation. Electronically Signed   By: Lowella Grip III  M.D.   On: 01/07/2015 17:23   Ct Angio Chest Pe W/cm &/or Wo Cm  01/08/2015  CLINICAL DATA:  Tachycardia.  Postop ruptured ectopic pregnancy. EXAM: CT ANGIOGRAPHY CHEST WITH CONTRAST TECHNIQUE: Multidetector CT imaging of the chest was performed using the standard protocol during bolus administration of intravenous contrast. Multiplanar CT image reconstructions and MIPs were obtained to evaluate the vascular anatomy. CONTRAST:  163mL OMNIPAQUE IOHEXOL 350 MG/ML SOLN COMPARISON:  Chest x-ray 01/07/2015 FINDINGS: No filling defects in the pulmonary arteries to suggest pulmonary emboli. There is dependent and bibasilar atelectasis. Low lung volumes. No effusions. Heart is normal size. Aorta is normal caliber. No mediastinal, hilar, or axillary adenopathy. Chest wall soft tissues are unremarkable. Bilateral breast implants noted. Imaging into the upper abdomen shows no acute findings. No acute bony abnormality. Review of the MIP images confirms the above findings. IMPRESSION: No evidence of pulmonary embolus. Low lung volumes.  Dependent and bibasilar atelectasis. Electronically Signed   By: Rolm Baptise M.D.   On: 01/08/2015 13:34   US Ob Comp Less 14 Wks  01/06/2015  CLINICAL DATA:  Right lower quadrant pain. Gestational age by LMP of 6 weeks 5 days. EXAM: OBSTETRIC <14 WK [place] AND TRANSVAGINAL OB US TECHNIQUE: Both transabdominal and transvaginal ultrasound examinations were performed for complete evaluation of the gestation as well as the maternal uterus, adnexal regions, and pelvic cul-de-sac. Transvaginal  technique was performed to assess early pregnancy. COMPARISON:  None. FINDINGS: No intrauterine gestational sac visualized. Both ovaries appear normal. There is a complex mass with peripheral hyperechoic echotexture and small central cystic area in the right adnexa, which appears separate from the right ovary. This measures 3.9 x 3.1 x 2.8 cm. There is also a moderate to large amount of complex free fluid within the pelvis in both adnexa and surrounding the uterus. IMPRESSION: No intrauterine gestational sac. 3.9 cm complex right adnexal mass which appears separate from the right ovary, with moderate to large amount of complex free fluid. This is suspicious for ruptured ectopic pregnancy. Critical Value/emergent results were called by telephone at the time of interpretation on 01/06/2015 at 6:16 pm to Dr. Loura Pardon , who verbally acknowledged these results. Electronically Signed   By: Earle Gell M.D.   On: 01/06/2015 18:19   US Ob Transvaginal  01/06/2015  CLINICAL DATA:  Right lower quadrant pain. Gestational age by LMP of 6 weeks 5 days. EXAM: OBSTETRIC <14 WK [place] AND TRANSVAGINAL OB US TECHNIQUE: Both transabdominal and transvaginal ultrasound examinations were performed for complete evaluation of the gestation as well as the maternal uterus, adnexal regions, and pelvic cul-de-sac. Transvaginal technique was performed to assess early pregnancy. COMPARISON:  None. FINDINGS: No intrauterine gestational sac visualized. Both ovaries appear normal. There is a complex mass with peripheral hyperechoic echotexture and small central cystic area in the right adnexa, which appears separate from the right ovary. This measures 3.9 x 3.1 x 2.8 cm. There is also a moderate to large amount of complex free fluid within the pelvis in both adnexa and surrounding the uterus. IMPRESSION: No intrauterine gestational sac. 3.9 cm complex right adnexal mass which appears separate from the right ovary, with moderate to large amount of  complex free fluid. This is suspicious for ruptured ectopic pregnancy. Critical Value/emergent results were called by telephone at the time of interpretation on 01/06/2015 at 6:16 pm to Dr. Loura Pardon , who verbally acknowledged these results. Electronically Signed   By: Earle Gell M.D.   On: 01/06/2015  18:19    Treatments: IV hydration and electrolyte repletion, analgesia: Morphine and percocet, respiratory therapy: incentive spirometry, and blood transfusion (3 units of packed red blood cells)  Discharge Exam: Blood pressure 101/67, pulse 103, temperature 98.2 F (36.8 C), temperature source Oral, resp. rate 18, height $RemoveBe'5\' 4"'Avnnnwwpb$  (1.626 m), weight 200 lb (90.719 kg), SpO2 100 %. General appearance: alert, appears stated age and no distress Head: Normocephalic, without obvious abnormality, atraumatic Resp: clear to auscultation bilaterally Cardio: regular rate and rhythm, S1, S2 normal, no murmur, click, rub or gallop GI: soft, non-tender; bowel sounds normal; no masses,  no organomegaly Extremities: extremities normal, atraumatic, no cyanosis or edema Pulses: 2+ and symmetric Skin: Skin color, texture, turgor normal. No rashes or lesions Incision/Wound: D/C/I trocar sites  Disposition: Home      Discharge Instructions    Activity as tolerated    Complete by:  As directed      Call MD for:  difficulty breathing, headache or visual disturbances    Complete by:  As directed      Call MD for:  difficulty breathing, headache or visual disturbances    Complete by:  As directed      Call MD for:  extreme fatigue    Complete by:  As directed      Call MD for:  extreme fatigue    Complete by:  As directed      Call MD for:  hives    Complete by:  As directed      Call MD for:  hives    Complete by:  As directed      Call MD for:  persistant dizziness or light-headedness    Complete by:  As directed      Call MD for:  persistant dizziness or light-headedness    Complete by:  As directed       Call MD for:  persistant nausea and vomiting    Complete by:  As directed      Call MD for:  persistant nausea and vomiting    Complete by:  As directed      Call MD for:  redness, tenderness, or signs of infection (pain, swelling, redness, odor or green/yellow discharge around incision site)    Complete by:  As directed      Call MD for:  redness, tenderness, or signs of infection (pain, swelling, redness, odor or green/yellow discharge around incision site)    Complete by:  As directed      Call MD for:  severe uncontrolled pain    Complete by:  As directed      Call MD for:  severe uncontrolled pain    Complete by:  As directed      Call MD for:  temperature >100.4    Complete by:  As directed      Call MD for:  temperature >100.4    Complete by:  As directed      Diet general    Complete by:  As directed      Driving Restrictions    Complete by:  As directed   No driving while taking narcotic pain medication     Driving restriction     Complete by:  As directed   Avoid driving for at least 2 weeks, or while taking prescription narcotics     Increase activity slowly    Complete by:  As directed      Lifting restrictions    Complete by:  As directed   Weight restriction of 10 lbs for 2 weeks     May shower / Bathe    Complete by:  As directed      May walk up steps    Complete by:  As directed      No dressing needed    Complete by:  As directed      No dressing needed    Complete by:  As directed      Sexual Activity Restrictions    Complete by:  As directed   No sexual activity for 6 weeks     Sexual acrtivity    Complete by:  As directed   No intercourse or tampons for 6 weeks            Medication List    STOP taking these medications        drospirenone-ethinyl estradiol 3-0.03 MG tablet  Commonly known as:  YASMIN,ZARAH,SYEDA     phentermine 37.5 MG tablet  Commonly known as:  ADIPEX-P     vitamin B-12 1000 MCG tablet  Commonly known as:   CYANOCOBALAMIN      TAKE these medications        ferrous sulfate 325 (65 FE) MG tablet  Commonly known as:  FERROUSUL  Take 1 tablet (325 mg total) by mouth 2 (two) times daily.     ibuprofen 600 MG tablet  Commonly known as:  ADVIL,MOTRIN  Take 1 tablet (600 mg total) by mouth every 6 (six) hours as needed for fever or headache.     ondansetron 4 MG disintegrating tablet  Commonly known as:  ZOFRAN ODT  Take 1 tablet (4 mg total) by mouth every 8 (eight) hours as needed for nausea or vomiting.     oxyCODONE-acetaminophen 5-325 MG tablet  Commonly known as:  PERCOCET/ROXICET  Take 1-2 tablets by mouth every 6 (six) hours as needed.       Follow-up Information    Follow up with Dorthula Nettles, MD In 1 week.   Specialty:  Obstetrics and Gynecology   Why:  For wound re-check in [facility] office please   Contact information:   [address] [place] [address] [phone]       Signed: Will Bonnet, MD 01/10/2015, 1:04 PM

## 2015-01-10 LAB — CBC
HCT: 25.5 % — ABNORMAL LOW (ref 35.0–47.0)
HEMOGLOBIN: 8.5 g/dL — AB (ref 12.0–16.0)
MCH: 30.6 pg (ref 26.0–34.0)
MCHC: 33.5 g/dL (ref 32.0–36.0)
MCV: 91.5 fL (ref 80.0–100.0)
PLATELETS: 151 10*3/uL (ref 150–440)
RBC: 2.79 MIL/uL — AB (ref 3.80–5.20)
RDW: 13.9 % (ref 11.5–14.5)
WBC: 9.1 10*3/uL (ref 3.6–11.0)

## 2015-01-10 MED ORDER — FAMOTIDINE 20 MG PO TABS
20.0000 mg | ORAL_TABLET | Freq: Two times a day (BID) | ORAL | Status: DC
  Administered 2015-01-10: 20 mg via ORAL
  Filled 2015-01-10: qty 1

## 2015-01-10 MED ORDER — ONDANSETRON 4 MG PO TBDP: 4.0000 mg | ORAL_TABLET | Freq: Three times a day (TID) | ORAL | Status: DC | PRN

## 2015-01-10 NOTE — Progress Notes (Addendum)
  Patient has BPs of 80s/40s, had episodes of diarrhea overnight, and feels warm and clammy.  Not febrile.  Describes feeling as she did before she had surgery.     LR Bolus and stat CBC ordered and CT Angio abd/pelvis to evaluate for continued bleeding from surgical site or in intra-bdominal site.   ----- Larey Days, MD Attending Obstetrician and Gynecologist [facility]  _______________________________________  UPDATE:   I was unable to see patient at time of report due to delivery at same time.  Shortly after delivery I went to patient's bedside to evaluate.   She describes her pain as burning and "hot" within her abdomen, similar to when she first had pain with her ectopic.    BP improved to 116/69.   Her color is normal, not pale.  She is not in distress.  Abdomen soft, tender diffusely, but not distended and no peritoneal signs.  Her nurse is attempting to start an IV for both labs and injection purposes.    Given her continued complaints and episodes of weakness and hypotension, I am going to continue with the order for the CBC and the CT Angiogram.  This test can at least give [place] peace of mind that her symptoms are due to a controlled and non-emergent situation.  Should an area of extravasation be identified, I will notify IR and attempt a non-surgical route vs return to the OR.    ----- Larey Days, MD Attending Obstetrician and [facility]   _____________________________________________  UPDATE:  CBC returned with Hb stable at 8.5 - cancelled CTA as this would not be if there were any significant bleed.    ----- Larey Days, MD Attending Obstetrician and Gynecologist [facility]

## 2015-01-10 NOTE — Evaluation (Signed)
Physical Therapy Evaluation Patient Details Name: Kellie Davis MRN: [id number] DOB: 1985/03/27 Today's Date: 01/10/2015   History of Present Illness  Pt admitted for ectopic pregnancy rupture with laparoscopic salpingectomy on 01/06/15. Pt with inital complaints of abdominal pain and R side weakness.   Clinical Impression  Pt demonstrates all bed mobility/transfers/ambulation with supervision and recommend for close supervision by family at home. Increase activity as pain allows. Pt does not require any further PT needs at this time. Pt will be dc in house and does not require follow up. RN aware. Will dc current orders.     Follow Up Recommendations No PT follow up    Equipment Recommendations  None recommended by PT    Recommendations for Other Services       Precautions / Restrictions Precautions Precautions: None Restrictions Weight Bearing Restrictions: No      Mobility  Bed Mobility Overal bed mobility: Independent             General bed mobility comments: safe technique performed, however is slow with transfer secondary to pain  Transfers Overall transfer level: Needs assistance Equipment used: 1 person hand held assist Transfers: Sit to/from Stand Sit to Stand: Min assist         General transfer comment: sit<>Stand with min assist. HHA used and pt unable to fully stand upright secondary to pain. No LOB noted  Ambulation/Gait Ambulation/Gait assistance: Min guard Ambulation Distance (Feet): 120 Feet Assistive device: 1 person hand held assist Gait Pattern/deviations: Step-through pattern     General Gait Details: ambulated with slow, antalgic gait pattern with HHA. Small LOB noted on R side, however pt able to self correct to prevent falls. L foot supination noted.   Stairs            Wheelchair Mobility    Modified Rankin (Stroke Patients Only)       Balance Overall balance assessment: Independent                                           Pertinent Vitals/Pain Pain Assessment: Faces Faces Pain Scale: Hurts whole lot Pain Location: R abdominal surgical site Pain Descriptors / Indicators: Constant Pain Intervention(s): Limited activity within patient's tolerance;Patient requesting pain meds-RN notified    Home Living Family/patient expects to be discharged to:: Private residence Living Arrangements: Spouse/significant other Available Help at Discharge: Family Type of Home: House         Home Equipment: None      Prior Function Level of Independence: Independent               Hand Dominance        Extremity/Trunk Assessment   Upper Extremity Assessment: Generalized weakness (grossly 4/5)           Lower Extremity Assessment: Generalized weakness (grossly 4/5-no significant weakness L vs R side)         Communication   Communication: No difficulties  Cognition Arousal/Alertness: Awake/alert Behavior During Therapy: WFL for tasks assessed/performed Overall Cognitive Status: Within Functional Limits for tasks assessed                      General Comments General comments (skin integrity, edema, etc.): Pt in significant amount of pain with upright posture, reports minimal amount of pain with supine position    Exercises  Assessment/Plan    PT Assessment Patent does not need any further PT services  PT Diagnosis Difficulty walking;Acute pain   PT Problem List    PT Treatment Interventions     PT Goals (Current goals can be found in the Care Plan section) Acute Rehab PT Goals Patient Stated Goal: to go home PT Goal Formulation: All assessment and education complete, DC therapy Time For Goal Achievement: 22-Jan-2015    Frequency     Barriers to discharge        Co-evaluation               End of Session   Activity Tolerance: Patient limited by pain Patient left: in bed;with family/visitor present Nurse Communication: Mobility  status;Patient requests pain meds         Time: [id number] PT Time Calculation (min) (ACUTE ONLY): 23 min   Charges:   PT Evaluation $Initial PT Evaluation Tier I: 1 Procedure     PT G Codes:        Carisa Backhaus 01/22/2015, 12:36 PM  Greggory Stallion, PT, DPT [phone]

## 2015-01-12 LAB — TYPE AND SCREEN
ABO/RH(D): A POS
Antibody Screen: NEGATIVE
UNIT DIVISION: 0
UNIT DIVISION: 0
UNIT DIVISION: 0
Unit division: 0
Unit division: 0
Unit division: 0

## 2015-11-06 LAB — OB RESULTS CONSOLE PLATELET COUNT: PLATELETS: 203 10*3/uL

## 2015-11-06 LAB — OB RESULTS CONSOLE HEPATITIS B SURFACE ANTIGEN: HEP B S AG: NEGATIVE

## 2015-11-06 LAB — OB RESULTS CONSOLE RUBELLA ANTIBODY, IGM: RUBELLA: IMMUNE

## 2015-11-06 LAB — OB RESULTS CONSOLE RPR: RPR: NONREACTIVE

## 2015-11-06 LAB — OB RESULTS CONSOLE VARICELLA ZOSTER ANTIBODY, IGG: VARICELLA IGG: IMMUNE

## 2015-11-06 LAB — OB RESULTS CONSOLE HGB/HCT, BLOOD: Hemoglobin: 13.3 g/dL

## 2016-03-01 ENCOUNTER — Ambulatory Visit: Payer: BLUE CROSS/BLUE SHIELD

## 2016-03-08 ENCOUNTER — Ambulatory Visit: Payer: BLUE CROSS/BLUE SHIELD

## 2016-03-30 ENCOUNTER — Encounter: Payer: Self-pay | Admitting: Obstetrics and Gynecology

## 2016-03-30 DIAGNOSIS — Z8639 Personal history of other endocrine, nutritional and metabolic disease: Secondary | ICD-10-CM | POA: Insufficient documentation

## 2016-03-30 NOTE — Assessment & Plan Note (Signed)
Ruptured cornual ectopic treated with cornual resection requires Cesarean delivery at 37 weeks in subsequent pregnancies

## 2016-03-30 NOTE — Assessment & Plan Note (Signed)
Treat as classical incision with delivery in subsequent pregnancies by cesarean section at 37 weeks

## 2016-03-31 ENCOUNTER — Encounter: Payer: Self-pay | Admitting: Obstetrics and Gynecology

## 2016-03-31 ENCOUNTER — Other Ambulatory Visit: Payer: Managed Care, Other (non HMO)

## 2016-03-31 ENCOUNTER — Ambulatory Visit (INDEPENDENT_AMBULATORY_CARE_PROVIDER_SITE_OTHER): Payer: Managed Care, Other (non HMO) | Admitting: Obstetrics and Gynecology

## 2016-03-31 VITALS — BP 100/60 | Wt 233.0 lb

## 2016-03-31 DIAGNOSIS — Z131 Encounter for screening for diabetes mellitus: Secondary | ICD-10-CM

## 2016-03-31 DIAGNOSIS — O99213 Obesity complicating pregnancy, third trimester: Secondary | ICD-10-CM

## 2016-03-31 DIAGNOSIS — O0913 Supervision of pregnancy with history of ectopic or molar pregnancy, third trimester: Secondary | ICD-10-CM

## 2016-03-31 DIAGNOSIS — O368390 Maternal care for abnormalities of the fetal heart rate or rhythm, unspecified trimester, not applicable or unspecified: Secondary | ICD-10-CM

## 2016-03-31 DIAGNOSIS — Z113 Encounter for screening for infections with a predominantly sexual mode of transmission: Secondary | ICD-10-CM

## 2016-03-31 DIAGNOSIS — Z8639 Personal history of other endocrine, nutritional and metabolic disease: Secondary | ICD-10-CM

## 2016-03-31 DIAGNOSIS — Z3A28 28 weeks gestation of pregnancy: Secondary | ICD-10-CM

## 2016-03-31 NOTE — Addendum Note (Signed)
Addended by: Dorthula Nettles on: 03/31/2016 02:12 PM   Modules accepted: Orders

## 2016-03-31 NOTE — Patient Instructions (Signed)
Third Trimester of Pregnancy The third trimester is from week 28 through week 40 (months 7 through 9). The third trimester is a time when the unborn baby (fetus) is growing rapidly. At the end of the ninth month, the fetus is about 20 inches in length and weighs 6-10 pounds. Body changes during your third trimester Your body will continue to go through many changes during pregnancy. The changes vary from woman to woman. During the third trimester:  Your weight will continue to increase. You can expect to gain 25-35 pounds (11-16 kg) by the end of the pregnancy.  You may begin to get stretch marks on your hips, abdomen, and breasts.  You may urinate more often because the fetus is moving lower into your pelvis and pressing on your bladder.  You may develop or continue to have heartburn. This is caused by increased hormones that slow down muscles in the digestive tract.  You may develop or continue to have constipation because increased hormones slow digestion and cause the muscles that push waste through your intestines to relax.  You may develop hemorrhoids. These are swollen veins (varicose veins) in the rectum that can itch or be painful.  You may develop swollen, bulging veins (varicose veins) in your legs.  You may have increased body aches in the pelvis, back, or thighs. This is due to weight gain and increased hormones that are relaxing your joints.  You may have changes in your hair. These can include thickening of your hair, rapid growth, and changes in texture. Some women also have hair loss during or after pregnancy, or hair that feels dry or thin. Your hair will most likely return to normal after your baby is born.  Your breasts will continue to grow and they will continue to become tender. A yellow fluid (colostrum) may leak from your breasts. This is the first milk you are producing for your baby.  Your belly button may stick out.  You may notice more swelling in your hands,  face, or ankles.  You may have increased tingling or numbness in your hands, arms, and legs. The skin on your belly may also feel numb.  You may feel short of breath because of your expanding uterus.  You may have more problems sleeping. This can be caused by the size of your belly, increased need to urinate, and an increase in your body's metabolism.  You may notice the fetus "dropping," or moving lower in your abdomen (lightening).  You may have increased vaginal discharge.  You may notice your joints feel loose and you may have pain around your pelvic bone.  What to expect at prenatal visits You will have prenatal exams every 2 weeks until week 36. Then you will have weekly prenatal exams. During a routine prenatal visit:  You will be weighed to make sure you and the baby are growing normally.  Your blood pressure will be taken.  Your abdomen will be measured to track your baby's growth.  The fetal heartbeat will be listened to.  Any test results from the previous visit will be discussed.  You may have a cervical check near your due date to see if your cervix has softened or thinned (effaced).  You will be tested for Group B streptococcus. This happens between 35 and 37 weeks.  Your health care provider may ask you:  What your birth plan is.  How you are feeling.  If you are feeling the baby move.  If you have had   any abnormal symptoms, such as leaking fluid, bleeding, severe headaches, or abdominal cramping.  If you are using any tobacco products, including cigarettes, chewing tobacco, and electronic cigarettes.  If you have any questions.  Other tests or screenings that may be performed during your third trimester include:  Blood tests that check for low iron levels (anemia).  Fetal testing to check the health, activity level, and growth of the fetus. Testing is done if you have certain medical conditions or if there are problems during the  pregnancy.  Nonstress test (NST). This test checks the health of your baby to make sure there are no signs of problems, such as the baby not getting enough oxygen. During this test, a belt is placed around your belly. The baby is made to move, and its heart rate is monitored during movement.  What is false labor? False labor is a condition in which you feel small, irregular tightenings of the muscles in the womb (contractions) that usually go away with rest, changing position, or drinking water. These are called Braxton Hicks contractions. Contractions may last for hours, days, or even weeks before true labor sets in. If contractions come at regular intervals, become more frequent, increase in intensity, or become painful, you should see your health care provider. What are the signs of labor?  Abdominal cramps.  Regular contractions that start at 10 minutes apart and become stronger and more frequent with time.  Contractions that start on the top of the uterus and spread down to the lower abdomen and back.  Increased pelvic pressure and dull back pain.  A watery or bloody mucus discharge that comes from the vagina.  Leaking of amniotic fluid. This is also known as your "water breaking." It could be a slow trickle or a gush. Let your health care provider know if it has a color or strange odor. If you have any of these signs, call your health care provider right away, even if it is before your due date. Follow these instructions at home: Medicines  Follow your health care provider's instructions regarding medicine use. Specific medicines may be either safe or unsafe to take during pregnancy.  Take a prenatal vitamin that contains at least 600 micrograms (mcg) of folic acid.  If you develop constipation, try taking a stool softener if your health care provider approves. Eating and drinking  Eat a balanced diet that includes fresh fruits and vegetables, whole grains, good sources of protein  such as meat, eggs, or tofu, and low-fat dairy. Your health care provider will help you determine the amount of weight gain that is right for you.  Avoid raw meat and uncooked cheese. These carry germs that can cause birth defects in the baby.  If you have low calcium intake from food, talk to your health care provider about whether you should take a daily calcium supplement.  Eat four or five small meals rather than three large meals a day.  Limit foods that are high in fat and processed sugars, such as fried and sweet foods.  To prevent constipation: ? Drink enough fluid to keep your urine clear or pale yellow. ? Eat foods that are high in fiber, such as fresh fruits and vegetables, whole grains, and beans. Activity  Exercise only as directed by your health care provider. Most women can continue their usual exercise routine during pregnancy. Try to exercise for 30 minutes at least 5 days a week. Stop exercising if you experience uterine contractions.  Avoid heavy   lifting.  Do not exercise in extreme heat or humidity, or at high altitudes.  Wear low-heel, comfortable shoes.  Practice good posture.  You may continue to have sex unless your health care provider tells you otherwise. Relieving pain and discomfort  Take frequent breaks and rest with your legs elevated if you have leg cramps or low back pain.  Take warm sitz baths to soothe any pain or discomfort caused by hemorrhoids. Use hemorrhoid cream if your health care provider approves.  Wear a good support bra to prevent discomfort from breast tenderness.  If you develop varicose veins: ? Wear support pantyhose or compression stockings as told by your healthcare provider. ? Elevate your feet for 15 minutes, 3-4 times a day. Prenatal care  Write down your questions. Take them to your prenatal visits.  Keep all your prenatal visits as told by your health care provider. This is important. Safety  Wear your seat belt at  all times when driving.  Make a list of emergency phone numbers, including numbers for family, friends, the hospital, and police and fire departments. General instructions  Avoid cat litter boxes and soil used by cats. These carry germs that can cause birth defects in the baby. If you have a cat, ask someone to clean the litter box for you.  Do not travel far distances unless it is absolutely necessary and only with the approval of your health care provider.  Do not use hot tubs, steam rooms, or saunas.  Do not drink alcohol.  Do not use any products that contain nicotine or tobacco, such as cigarettes and e-cigarettes. If you need help quitting, ask your health care provider.  Do not use any medicinal herbs or unprescribed drugs. These chemicals affect the formation and growth of the baby.  Do not douche or use tampons or scented sanitary pads.  Do not cross your legs for long periods of time.  To prepare for the arrival of your baby: ? Take prenatal classes to understand, practice, and ask questions about labor and delivery. ? Make a trial run to the hospital. ? Visit the hospital and tour the maternity area. ? Arrange for maternity or paternity leave through employers. ? Arrange for family and friends to take care of pets while you are in the hospital. ? Purchase a rear-facing car seat and make sure you know how to install it in your car. ? Pack your hospital bag. ? Prepare the baby's nursery. Make sure to remove all pillows and stuffed animals from the baby's crib to prevent suffocation.  Visit your dentist if you have not gone during your pregnancy. Use a soft toothbrush to brush your teeth and be gentle when you floss. Contact a health care provider if:  You are unsure if you are in labor or if your water has broken.  You become dizzy.  You have mild pelvic cramps, pelvic pressure, or nagging pain in your abdominal area.  You have lower back pain.  You have persistent  nausea, vomiting, or diarrhea.  You have an unusual or bad smelling vaginal discharge.  You have pain when you urinate. Get help right away if:  Your water breaks before 37 weeks.  You have regular contractions less than 5 minutes apart before 37 weeks.  You have a fever.  You are leaking fluid from your vagina.  You have spotting or bleeding from your vagina.  You have severe abdominal pain or cramping.  You have rapid weight loss or weight gain.    You have shortness of breath with chest pain.  You notice sudden or extreme swelling of your face, hands, ankles, feet, or legs.  Your baby makes fewer than 10 movements in 2 hours.  You have severe headaches that do not go away when you take medicine.  You have vision changes. Summary  The third trimester is from week 28 through week 40, months 7 through 9. The third trimester is a time when the unborn baby (fetus) is growing rapidly.  During the third trimester, your discomfort may increase as you and your baby continue to gain weight. You may have abdominal, leg, and back pain, sleeping problems, and an increased need to urinate.  During the third trimester your breasts will keep growing and they will continue to become tender. A yellow fluid (colostrum) may leak from your breasts. This is the first milk you are producing for your baby.  False labor is a condition in which you feel small, irregular tightenings of the muscles in the womb (contractions) that eventually go away. These are called Braxton Hicks contractions. Contractions may last for hours, days, or even weeks before true labor sets in.  Signs of labor can include: abdominal cramps; regular contractions that start at 10 minutes apart and become stronger and more frequent with time; watery or bloody mucus discharge that comes from the vagina; increased pelvic pressure and dull back pain; and leaking of amniotic fluid. This information is not intended to replace advice  given to you by your health care provider. Make sure you discuss any questions you have with your health care provider. Document Released: 01/12/2001 Document Revised: 06/26/2015 Document Reviewed: 03/21/2012 Elsevier Interactive Patient Education  2017 [employer]  

## 2016-03-31 NOTE — Progress Notes (Signed)
28 week labs today.  

## 2016-03-31 NOTE — Addendum Note (Signed)
Addended by: Dorthula Nettles on: 03/31/2016 12:05 PM   Modules accepted: Orders

## 2016-03-31 NOTE — Progress Notes (Signed)
    Routine Prenatal Care Visit  Subjective  Fetal Movement? yes Contractions? no Leaking Fluid? no Vaginal Bleeding? no  Objective   Vitals:   03/31/16 1101  BP: 100/60    @WEIGHTCHANGE @ Urine dipstick shows negative for all components.  General: NAD Pumonary: no increased work of breathing Abdomen: gravid, non-tender, fundal height 28, fetal heart tones 150BPM Extremities: no edema Psychiatric: mood appropriate, affect full  -- -- -- Negative -- Nonreactive -- (10/05 0000)   Assessment   31 y.o. G3P1011 at [redacted]w[redacted]d by  06/24/2016, by Ultrasound presenting for routine prenatal visit  pregnancy #3 Problems (from 08/21/15 to present)    No problems associated with this episode.       Plan   Problem List Items Addressed This Visit      Other   History of hypothyroidism   Relevant Orders   Thyroid Panel With TSH   Pregnancy in third trimester with history of ectopic pregnancy, antepartum - Primary    Ruptured cornual ectopic treated with cornual resection requires Cesarean delivery at 34 weeks in subsequent pregnancies      Obesity affecting pregnancy in third trimester, antepartum   Fetal arrhythmia affecting pregnancy, antepartum    Other Visit Diagnoses    [redacted] weeks gestation of pregnancy       Screening for venereal disease (VD)       Diabetes mellitus screening         -28 week labs and thyroid panel today

## 2016-04-01 LAB — 28 WEEK RH+PANEL
BASOS: 0 %
Basophils Absolute: 0 10*3/uL (ref 0.0–0.2)
EOS (ABSOLUTE): 0.1 10*3/uL (ref 0.0–0.4)
Eos: 0 %
Gestational Diabetes Screen: 86 mg/dL (ref 65–139)
HIV SCREEN 4TH GENERATION: NONREACTIVE
Hematocrit: 35.9 % (ref 34.0–46.6)
Hemoglobin: 12 g/dL (ref 11.1–15.9)
IMMATURE GRANS (ABS): 0.1 10*3/uL (ref 0.0–0.1)
IMMATURE GRANULOCYTES: 1 %
LYMPHS: 17 %
Lymphocytes Absolute: 2 10*3/uL (ref 0.7–3.1)
MCH: 31.5 pg (ref 26.6–33.0)
MCHC: 33.4 g/dL (ref 31.5–35.7)
MCV: 94 fL (ref 79–97)
MONOCYTES: 6 %
Monocytes Absolute: 0.7 10*3/uL (ref 0.1–0.9)
NEUTROS ABS: 9 10*3/uL — AB (ref 1.4–7.0)
NEUTROS PCT: 76 %
Platelets: 184 10*3/uL (ref 150–379)
RBC: 3.81 x10E6/uL (ref 3.77–5.28)
RDW: 13.2 % (ref 12.3–15.4)
RPR Ser Ql: NONREACTIVE
WBC: 11.9 10*3/uL — ABNORMAL HIGH (ref 3.4–10.8)

## 2016-04-01 LAB — THYROID PANEL WITH TSH
Free Thyroxine Index: 0.8 — ABNORMAL LOW (ref 1.2–4.9)
T3 Uptake Ratio: 12 % — ABNORMAL LOW (ref 24–39)
T4, Total: 6.6 ug/dL (ref 4.5–12.0)
TSH: 0.474 u[IU]/mL (ref 0.450–4.500)

## 2016-04-19 ENCOUNTER — Ambulatory Visit (INDEPENDENT_AMBULATORY_CARE_PROVIDER_SITE_OTHER): Payer: Managed Care, Other (non HMO) | Admitting: Obstetrics and Gynecology

## 2016-04-19 VITALS — BP 106/76 | Wt 238.0 lb

## 2016-04-19 DIAGNOSIS — Z23 Encounter for immunization: Secondary | ICD-10-CM | POA: Diagnosis not present

## 2016-04-19 DIAGNOSIS — O368390 Maternal care for abnormalities of the fetal heart rate or rhythm, unspecified trimester, not applicable or unspecified: Secondary | ICD-10-CM

## 2016-04-19 DIAGNOSIS — O0993 Supervision of high risk pregnancy, unspecified, third trimester: Secondary | ICD-10-CM

## 2016-04-19 DIAGNOSIS — O99213 Obesity complicating pregnancy, third trimester: Secondary | ICD-10-CM

## 2016-04-19 DIAGNOSIS — O0913 Supervision of pregnancy with history of ectopic or molar pregnancy, third trimester: Secondary | ICD-10-CM | POA: Diagnosis not present

## 2016-04-19 DIAGNOSIS — Z3A30 30 weeks gestation of pregnancy: Secondary | ICD-10-CM | POA: Diagnosis not present

## 2016-04-19 NOTE — Progress Notes (Signed)
Braxton Hicks/at night SOB/TDAP today

## 2016-04-27 ENCOUNTER — Telehealth: Payer: Self-pay | Admitting: Obstetrics and Gynecology

## 2016-04-27 NOTE — Telephone Encounter (Signed)
Pt is calling needing to know if she needs to be seen today. Pt is 32 weeks and having right side flank and Lower back pain. Please advise. [phone]

## 2016-04-27 NOTE — Telephone Encounter (Signed)
Pt schedule with Kenton Kingfisher 04/29/15 at 3:10

## 2016-04-27 NOTE — Telephone Encounter (Signed)
If there are any openings today or tomorrow please schedule her to come in with first available.. AMS is NOT in office until next week.

## 2016-04-28 ENCOUNTER — Ambulatory Visit (INDEPENDENT_AMBULATORY_CARE_PROVIDER_SITE_OTHER): Payer: Managed Care, Other (non HMO) | Admitting: Obstetrics & Gynecology

## 2016-04-28 VITALS — BP 120/80 | Wt 240.0 lb

## 2016-04-28 DIAGNOSIS — O9989 Other specified diseases and conditions complicating pregnancy, childbirth and the puerperium: Secondary | ICD-10-CM

## 2016-04-28 DIAGNOSIS — O0910 Supervision of pregnancy with history of ectopic or molar pregnancy, unspecified trimester: Secondary | ICD-10-CM | POA: Insufficient documentation

## 2016-04-28 DIAGNOSIS — Z8759 Personal history of other complications of pregnancy, childbirth and the puerperium: Secondary | ICD-10-CM

## 2016-04-28 DIAGNOSIS — Z3490 Encounter for supervision of normal pregnancy, unspecified, unspecified trimester: Secondary | ICD-10-CM

## 2016-04-28 DIAGNOSIS — O99213 Obesity complicating pregnancy, third trimester: Secondary | ICD-10-CM

## 2016-04-28 DIAGNOSIS — Z3A31 31 weeks gestation of pregnancy: Secondary | ICD-10-CM

## 2016-04-28 DIAGNOSIS — O26899 Other specified pregnancy related conditions, unspecified trimester: Secondary | ICD-10-CM

## 2016-04-28 DIAGNOSIS — R1031 Right lower quadrant pain: Secondary | ICD-10-CM

## 2016-04-28 NOTE — Progress Notes (Signed)
Pain on right side yesterday and today; h./o cornual ectopic on that side.  Exam reassuring.  Counseled as to risk factors for uterine rupture.  CBC today to assess counts.  [place] Monday as scheduled.  Return sooner if escalation of pain. Fetal movement counts.

## 2016-04-29 ENCOUNTER — Ambulatory Visit (INDEPENDENT_AMBULATORY_CARE_PROVIDER_SITE_OTHER): Payer: Managed Care, Other (non HMO)

## 2016-04-29 ENCOUNTER — Ambulatory Visit (INDEPENDENT_AMBULATORY_CARE_PROVIDER_SITE_OTHER): Payer: Managed Care, Other (non HMO) | Admitting: Advanced Practice Midwife

## 2016-04-29 VITALS — BP 124/66 | Wt 248.0 lb

## 2016-04-29 DIAGNOSIS — E559 Vitamin D deficiency, unspecified: Secondary | ICD-10-CM | POA: Insufficient documentation

## 2016-04-29 DIAGNOSIS — O99213 Obesity complicating pregnancy, third trimester: Secondary | ICD-10-CM

## 2016-04-29 DIAGNOSIS — O26899 Other specified pregnancy related conditions, unspecified trimester: Secondary | ICD-10-CM

## 2016-04-29 DIAGNOSIS — E7800 Pure hypercholesterolemia, unspecified: Secondary | ICD-10-CM | POA: Insufficient documentation

## 2016-04-29 DIAGNOSIS — R109 Unspecified abdominal pain: Secondary | ICD-10-CM

## 2016-04-29 DIAGNOSIS — O0913 Supervision of pregnancy with history of ectopic or molar pregnancy, third trimester: Secondary | ICD-10-CM | POA: Diagnosis not present

## 2016-04-29 DIAGNOSIS — Z3A32 32 weeks gestation of pregnancy: Secondary | ICD-10-CM

## 2016-04-29 LAB — HEMOGLOBIN: Hemoglobin: 10.9 g/dL — ABNORMAL LOW (ref 11.1–15.9)

## 2016-04-29 NOTE — Progress Notes (Signed)
Let pt know today that Hemoglobin is appropriate for third trimester, but still a little low compared to last check.  Need to monitor pain level and call/come in if worsens.  Also need to take iron daily to help with borderline anemia. Thx.

## 2016-04-29 NOTE — Patient Instructions (Signed)
Placental Abruption Placental abruption is a condition in which the placenta partly or completely separates from the uterus before the baby is born. The placenta is the organ that nourishes the unborn baby (fetus). The baby gets his or her blood supply and nutrients through the placenta. It is the baby's life support system. The placenta is attached to the inside of the uterus until after the baby is born. Placental abruption is rare, but it can happen any time after 20 weeks of pregnancy. A small separation may not cause problems, but a large separation may be dangerous for you and your baby. A large separation is usually an emergency. It requires treatment right away. What are the causes? In most cases, the cause of this condition is not known. What increases the risk? This condition is more likely to develop in women who:  Have experienced a recent trauma such as a fall, an abdominal injury, or a car accident.  Have a previous placental abruption.  Have high blood pressure (hypertension).  Smoke cigarettes, use alcohol, or use illegal drugs such as cocaine.  Have blood clotting problems.  Experience preterm premature rupture of membranes (PPROM).  Have multiples (twins, triplets, or more).  Have had children before.  Are 77 years of age or older. What are the signs or symptoms? Symptoms of this condition can vary from mild to severe. A small placental abruption may not cause symptoms, or it may cause mild symptoms, which may include:  Mild abdominal pain or lower back pain.  Slight vaginal bleeding. A severe placental abruption will cause symptoms. The symptoms will depend on the size of the separation and the stage of pregnancy. They may include:  Abdominal pain or lower back pain.  Vaginal bleeding.  Tender and hard uterus.  Severe abdominal pain with tenderness.  Continual contractions of your uterus.  Weakness and light-headedness. How is this diagnosed? This  condition may be diagnosed based on:  Your symptoms.  A physical exam.  Ultrasound.  Blood work. This will be done to make sure that there are enough healthy red blood cells and that there are no clotting problems or signs of too much blood loss. How is this treated? Treatment for placental abruption depends on the severity of the condition. For mild cases, treatment may involve monitoring your condition and managing your symptoms. This may involve:  Bed rest and close observation. For more severe cases, emergency treatment is needed. This may involve:  Staying in the hospital until you and your baby are stabilized.  Cesarean delivery of your baby.  A blood transfusion or other fluids given through an IV tube.  Other treatments, depending on:  The amount of bleeding you have.  Whether you or your baby are in distress.  The stage of your pregnancy.  The maturity of the baby. Follow these instructions at home:  Take over-the-counter and prescription medicines only as told by your health care provider. Do not take any medicines that your health care provider has not approved.  Arrange for help at home before and after you deliver your baby, especially if you had a cesarean delivery or if you lost a lot of blood.  Get plenty of rest and sleep.  Do not use illegal drugs.  Do not drink alcohol.  Do not have sexual intercourse until your health care provider says it is okay.  Do not use tampons or douche unless your health care provider says it is okay.  Do not use any products that  contain nicotine or tobacco, such as cigarettes and e-cigarettes. If you need help quitting, ask your health care provider. Get help right away if:  You have vaginal bleeding or spotting.  You have any type of trauma, such as a fall, abdominal trauma, or a car accident.  You have abdominal pain.  You have continuous uterine contractions.  You have a hard, tender uterus.  You do not  feel the baby move, or the baby moves very little. This information is not intended to replace advice given to you by your health care provider. Make sure you discuss any questions you have with your health care provider. Document Released: 01/18/2005 Document Revised: 09/18/2015 Document Reviewed: 08/10/2015 Elsevier Interactive Patient Education  2017 [employer].

## 2016-04-29 NOTE — Progress Notes (Addendum)
Growth scan today baby at 4 pounds 1 oz, 44%, AFI 16cm. Still having some right side abdominal pain and tenderness but not worse than yesterday. Area seen on U/S either hemorrhage or small abruption on right side of placenta. Will have MD look at U/S and then f/u with patient regarding diagnosis. Precautions reviewed with patient. Return/go to hospital for severe abdominal pain, tenderness and firm abdomen, vaginal bleeding. Has f/u appointment on Monday.

## 2016-04-29 NOTE — Progress Notes (Signed)
lmtrc

## 2016-04-29 NOTE — Progress Notes (Signed)
U/S today per [facility]

## 2016-04-29 NOTE — Progress Notes (Signed)
Pt aware.

## 2016-05-02 ENCOUNTER — Observation Stay
Admission: EM | Admit: 2016-05-02 | Discharge: 2016-05-02 | Disposition: A | Discharge status: Home / Self Care | Payer: Managed Care, Other (non HMO) | Attending: Obstetrics and Gynecology | Admitting: Obstetrics and Gynecology

## 2016-05-02 DIAGNOSIS — Z3A32 32 weeks gestation of pregnancy: Secondary | ICD-10-CM | POA: Insufficient documentation

## 2016-05-02 DIAGNOSIS — O26899 Other specified pregnancy related conditions, unspecified trimester: Secondary | ICD-10-CM

## 2016-05-02 DIAGNOSIS — O471 False labor at or after 37 completed weeks of gestation: Principal | ICD-10-CM | POA: Insufficient documentation

## 2016-05-02 DIAGNOSIS — R109 Unspecified abdominal pain: Secondary | ICD-10-CM

## 2016-05-02 LAB — URINALYSIS, COMPLETE (UACMP) WITH MICROSCOPIC
Bilirubin Urine: NEGATIVE
GLUCOSE, UA: NEGATIVE mg/dL
HGB URINE DIPSTICK: NEGATIVE
Ketones, ur: NEGATIVE mg/dL
LEUKOCYTES UA: NEGATIVE
Nitrite: NEGATIVE
Protein, ur: NEGATIVE mg/dL
SPECIFIC GRAVITY, URINE: 1.012 (ref 1.005–1.030)
pH: 6 (ref 5.0–8.0)

## 2016-05-02 NOTE — OB Triage Note (Signed)
Patient came in c/o of contractions that began this morning since she woke up from bed. Reports hat contractions are every 10 mins apart. Rating pain 6 out of 10 with contractions. Contractions felt mid abdomen. Reporting lower back pain that is constant since this morning as well. Rating this pain a 6 out of 10. Back pain felt more on right side. Patient denies vaginal bleeding. Reports small vaginal discharge, which as been going on for about a week. Reports baby less active than usual since this morning. Patient has history of placental hematoma this pregnancy.

## 2016-05-02 NOTE — Final Progress Note (Signed)
Physician Final Progress Note  Patient ID: MYKAEL TROTT MRN: [id number] DOB/AGE: 1985/08/17 31 y.o.  Admit date: 05/02/2016 Admitting provider: Will Bonnet, MD Discharge date: 05/02/2016   Admission Diagnoses:  1) abdominal pain, right flank, affecting pregnancy 3rd trimester 2) uterine contractions  Discharge Diagnoses:  1) abdominal pain, right flank, affecting pregnancy 3rd trimester 2) uterine contractions, no evidence of labor (braxton hicks contractions)  History of Present Illness: The patient is a 31 y.o. female G3P1011 at [redacted]w[redacted]d who presents for continued right side and flank pain.  She also began having contractions today that were about 10 minutes apart. She states the contractions were strong enough to make her palms sweat.  She notes +FM, no LOF, and no  Vaginal bleeding. Pregnancy complicated by history of cornual ectopic pregnancy in 2016. For this she is schedule for a cesarean delivery at 37 weeks.  She has been having right sided pain, dull and aching, 3/10 since Wednesday.  The pain was intermittent and is now more constant.  It is also in her back.  She was seen in clinic 3 and 4 days ago. She underwent an ultrasound for her pain. Per the ultrasound report, she had a possible retroplacental hematoma that was on the right side of her uterus.  She is scheduled for repeat u/s and routine prenatal tomorrow.    Hospital Course: She was admitted for the above. She showed no uterine tocometric activity while here.  She reported a cessation of her contraction while here.  She still reported pain on her right side and back. A bedside ultrasound was performed. Her placenta is mostly anterior and left extending to the right of her midline by a couple inches, but not extending to the cornual region.  No evidence of hematoma found.  Areas of vascularity noted, but all with color doppler flow, especially if interrogated from different angles.  A UA was sent. She had no peritoneal signs  on abdominal exams. She was overall feeling better from a contraction standpoint and her pain was more-or-less unchanged from when she presented.  Her fetal tracing was category 1 throughout her visit.  She desired discharge with close follow up already scheduled for tomorrow.  Precautions were given.  Discussed other etiologies for pain, including appendicitis, GU, GI, MSK. Precautions for these given, also.   Past Medical History:  Diagnosis Date  . Constipation   . Contraceptive surveillance   . Vaginal Pap smear, abnormal     Past Surgical History:  Procedure Laterality Date  . BREAST ENHANCEMENT SURGERY  2014  . LAPAROSCOPIC SALPINGO OOPHERECTOMY Right 01/06/2015   Procedure: LAPAROSCOPIC right SALPINGECTOMy with removal of ectopic and partial  right corneal resection ;  Surgeon: [name], MD;  Location: [facility] ORS;  Service: Gynecology;  Laterality: Right;  . TONSILLECTOMY  2014    No current facility-administered medications on file prior to encounter.    Current Outpatient Prescriptions on File Prior to Encounter  Medication Sig Dispense Refill  . Prenatal Vit-Fe Fumarate-FA (PRENAVITE PO) Take 1 tablet by mouth daily.    . ferrous sulfate (FERROUSUL) 325 (65 FE) MG tablet Take 1 tablet (325 mg total) by mouth 2 (two) times daily. (Patient not taking: Reported on 03/31/2016) 60 tablet 1  . ondansetron (ZOFRAN ODT) 4 MG disintegrating tablet Take 1 tablet (4 mg total) by mouth every 8 (eight) hours as needed for nausea or vomiting. (Patient not taking: Reported on 03/31/2016) 20 tablet 0    Allergies  Allergen Reactions  .  Codeine     Social History   Social History  . Marital status: Married    Spouse name: N/A  . Number of children: N/A  . Years of education: N/A   Occupational History  . Not on file.   Social History Main Topics  . Smoking status: Never Smoker  . Smokeless tobacco: Never Used  . Alcohol use Yes     Comment: occas  . Drug use: No  . Sexual  activity: Yes    Birth control/ protection: Pill   Other Topics Concern  . Not on file   Social History Narrative  . No narrative on file   Family History  Problem Relation Age of Onset  . Heart disease Mother   . Diabetes Mother      Physical Exam: BP 118/69 (BP Location: Left Arm)   Pulse 99   Temp 98 F (36.7 C) (Oral)   Resp 18   Vital Signs: BP 118/69 (BP Location: Left Arm)   Pulse 99   Temp 98 F (36.7 C) (Oral)   Resp 18  Constitutional: Well nourished, well developed female in no acute distress.  HEENT: normal Skin: Warm and dry.  Cardiovascular: Regular rate and rhythm.   Extremity: no edema  Respiratory: Clear to auscultation bilateral. Normal respiratory effort Abdomen: soft, gravid, nontender, nondistended.  +BS. No rebound or guarding. Back: no CVAT Neuro: DTRs 2+, Cranial nerves grossly intact Psych: Alert and Oriented x3. No memory deficits. Normal mood and affect.  MS: normal gait, normal bilateral lower extremity ROM/strength/stability.  Pelvic exam: deferred  Consults: None  Significant Findings/ Diagnostic Studies: UA pending  Procedures: NST Baseline FHR: 135 beats/min Variability: moderate Accelerations: present Decelerations: absent Tocometry: no activity  Bedside u/s: Fetus in breech presentation. Placenta anterior Fluid subjectively normal.   Good fetal movement noted with fetal breathing observed No evidence of hematoma or abruption.  Discharge Condition: poor  Disposition: 01-Home or Self Care  Diet: Regular diet  Discharge Activity: Activity as tolerated   Allergies as of 05/02/2016      Reactions   Codeine       Medication List    STOP taking these medications   ondansetron 4 MG disintegrating tablet Commonly known as:  ZOFRAN ODT     TAKE these medications   ferrous sulfate 325 (65 FE) MG tablet Commonly known as:  FERROUSUL Take 1 tablet (325 mg total) by mouth 2 (two) times daily.   PRENAVITE PO Take 1  tablet by mouth daily.      Follow up: tomorrow with previously schedule routine prenatal appointment.   Total time spent taking care of this patient: 45 minutes  Signed: Prentice Docker, MD  05/02/2016, 11:03 PM

## 2016-05-02 NOTE — Discharge Summary (Signed)
See fpn

## 2016-05-02 NOTE — Discharge Instructions (Signed)
Abdominal Pain During Pregnancy  Abdominal pain is common in pregnancy. Most of the time, it does not cause harm. There are many causes of abdominal pain. Some causes are more serious than others and sometimes the cause is not known. Abdominal pain can be a sign that something is very wrong with the pregnancy or the pain may have nothing to do with the pregnancy. Always tell your health care provider if you have any abdominal pain.  Follow these instructions at home:  · Do not have sex or put anything in your vagina until your symptoms go away completely.  · Watch your abdominal pain for any changes.  · Get plenty of rest until your pain improves.  · Drink enough fluid to keep your urine clear or pale yellow.  · Take over-the-counter or prescription medicines only as told by your health care provider.  · Keep all follow-up visits as told by your health care provider. This is important.  Contact a health care provider if:  · You have a fever.  · Your pain gets worse or you have cramping.  · Your pain continues after resting.  Get help right away if:  · You are bleeding, leaking fluid, or passing tissue from the vagina.  · You have vomiting or diarrhea that does not go away.  · You have painful or bloody urination.  · You notice a decrease in your baby's movements.  · You feel very weak or faint.  · You have shortness of breath.  · You develop a severe headache with abdominal pain.  · You have abnormal vaginal discharge with abdominal pain.  This information is not intended to replace advice given to you by your health care provider. Make sure you discuss any questions you have with your health care provider.  Document Released: 01/18/2005 Document Revised: 10/30/2015 Document Reviewed: 08/17/2012  Elsevier Interactive Patient Education © 2017 [employer]

## 2016-05-03 ENCOUNTER — Other Ambulatory Visit: Payer: Managed Care, Other (non HMO)

## 2016-05-03 ENCOUNTER — Other Ambulatory Visit: Payer: Self-pay | Admitting: Obstetrics and Gynecology

## 2016-05-03 ENCOUNTER — Other Ambulatory Visit (INDEPENDENT_AMBULATORY_CARE_PROVIDER_SITE_OTHER): Payer: Managed Care, Other (non HMO)

## 2016-05-03 ENCOUNTER — Ambulatory Visit (INDEPENDENT_AMBULATORY_CARE_PROVIDER_SITE_OTHER): Payer: Managed Care, Other (non HMO) | Admitting: Obstetrics and Gynecology

## 2016-05-03 ENCOUNTER — Encounter: Payer: Managed Care, Other (non HMO) | Admitting: Obstetrics and Gynecology

## 2016-05-03 ENCOUNTER — Telehealth: Payer: Self-pay | Admitting: Obstetrics and Gynecology

## 2016-05-03 VITALS — BP 108/86 | Temp 99.3°F | Wt 248.0 lb

## 2016-05-03 DIAGNOSIS — O0993 Supervision of high risk pregnancy, unspecified, third trimester: Secondary | ICD-10-CM

## 2016-05-03 DIAGNOSIS — O4593 Premature separation of placenta, unspecified, third trimester: Secondary | ICD-10-CM | POA: Diagnosis not present

## 2016-05-03 DIAGNOSIS — Z8759 Personal history of other complications of pregnancy, childbirth and the puerperium: Secondary | ICD-10-CM

## 2016-05-03 DIAGNOSIS — Z8639 Personal history of other endocrine, nutritional and metabolic disease: Secondary | ICD-10-CM

## 2016-05-03 DIAGNOSIS — Z3A32 32 weeks gestation of pregnancy: Secondary | ICD-10-CM

## 2016-05-03 DIAGNOSIS — O43899 Other placental disorders, unspecified trimester: Secondary | ICD-10-CM

## 2016-05-03 DIAGNOSIS — O368390 Maternal care for abnormalities of the fetal heart rate or rhythm, unspecified trimester, not applicable or unspecified: Secondary | ICD-10-CM

## 2016-05-03 DIAGNOSIS — O99213 Obesity complicating pregnancy, third trimester: Secondary | ICD-10-CM

## 2016-05-04 NOTE — Progress Notes (Signed)
    Routine Prenatal Care Visit  Subjective  Fetal Movement? yes Contractions? no Leaking Fluid? no Vaginal Bleeding? no  Objective   Vitals:   05/03/16 1540  BP: 108/86  Temp: 99.3 F (37.4 C)    @WEIGHTCHANGE @ Urine dipstick shows negative for all components.  General: NAD Pumonary: no increased work of breathing Abdomen: gravid, non-tender, fundal height 33, fetal heart tones 135BPM Extremities: no edema Psychiatric: mood appropriate, affect full   Assessment   31 y.o. G3P1011 at [redacted]w[redacted]d by  06/24/2016, by Ultrasound presenting for routine prenatal visit  pregnancy #3 Problems (from 08/21/15 to present)    No problems associated with this episode.       Plan   Problem List Items Addressed This Visit      Other   History of hypothyroidism   High-risk pregnancy supervision, third trimester - Primary   Obesity affecting pregnancy in third trimester, antepartum   Fetal arrhythmia affecting pregnancy, antepartum   History of ectopic pregnancy    Other Visit Diagnoses    Hematoma of placenta         - [place] reviewed questionable retroplacental hematoma appears to have decreased about 50% in size only measuring 2cm.  The patient has no readily identifiable risk factors for abruption.  We reviewed kick count and abruption precautions.  If repeat episode of acute pain I discussed presenting to L&D obtaining a KB and consideration for an MRI to evaluate myometrial thickness and integrity and the site of her prior cornual resection

## 2016-05-11 ENCOUNTER — Encounter: Payer: Self-pay | Admitting: Obstetrics and Gynecology

## 2016-05-12 ENCOUNTER — Ambulatory Visit (INDEPENDENT_AMBULATORY_CARE_PROVIDER_SITE_OTHER): Payer: Managed Care, Other (non HMO) | Admitting: Obstetrics and Gynecology

## 2016-05-12 VITALS — BP 102/70 | Wt 245.0 lb

## 2016-05-12 DIAGNOSIS — Z8759 Personal history of other complications of pregnancy, childbirth and the puerperium: Secondary | ICD-10-CM

## 2016-05-12 DIAGNOSIS — O368390 Maternal care for abnormalities of the fetal heart rate or rhythm, unspecified trimester, not applicable or unspecified: Secondary | ICD-10-CM

## 2016-05-12 DIAGNOSIS — Z3A33 33 weeks gestation of pregnancy: Secondary | ICD-10-CM

## 2016-05-12 DIAGNOSIS — O99213 Obesity complicating pregnancy, third trimester: Secondary | ICD-10-CM

## 2016-05-12 DIAGNOSIS — O0993 Supervision of high risk pregnancy, unspecified, third trimester: Secondary | ICD-10-CM

## 2016-05-12 MED ORDER — BREAST PUMP MISC: 0 refills | Status: DC

## 2016-05-12 NOTE — Progress Notes (Signed)
Dull right side pain/pain between shoulder blades/headaches

## 2016-05-17 ENCOUNTER — Observation Stay
Admission: EM | Admit: 2016-05-17 | Discharge: 2016-05-17 | Disposition: A | Discharge status: Home / Self Care | Payer: Managed Care, Other (non HMO) | Attending: Certified Nurse Midwife | Admitting: Certified Nurse Midwife

## 2016-05-17 ENCOUNTER — Observation Stay: Payer: Managed Care, Other (non HMO)

## 2016-05-17 DIAGNOSIS — R109 Unspecified abdominal pain: Secondary | ICD-10-CM

## 2016-05-17 DIAGNOSIS — Z3A34 34 weeks gestation of pregnancy: Secondary | ICD-10-CM | POA: Diagnosis not present

## 2016-05-17 DIAGNOSIS — O26893 Other specified pregnancy related conditions, third trimester: Principal | ICD-10-CM | POA: Insufficient documentation

## 2016-05-17 DIAGNOSIS — R1031 Right lower quadrant pain: Secondary | ICD-10-CM | POA: Diagnosis not present

## 2016-05-17 DIAGNOSIS — O26899 Other specified pregnancy related conditions, unspecified trimester: Secondary | ICD-10-CM

## 2016-05-17 LAB — CBC WITH DIFFERENTIAL/PLATELET
Basophils Absolute: 0 10*3/uL (ref 0–0.1)
Basophils Relative: 0 %
EOS ABS: 0.1 10*3/uL (ref 0–0.7)
Eosinophils Relative: 1 %
HEMATOCRIT: 36.3 % (ref 35.0–47.0)
HEMOGLOBIN: 12.2 g/dL (ref 12.0–16.0)
LYMPHS ABS: 2.8 10*3/uL (ref 1.0–3.6)
Lymphocytes Relative: 21 %
MCH: 30.5 pg (ref 26.0–34.0)
MCHC: 33.5 g/dL (ref 32.0–36.0)
MCV: 90.8 fL (ref 80.0–100.0)
Monocytes Absolute: 1 10*3/uL — ABNORMAL HIGH (ref 0.2–0.9)
Monocytes Relative: 7 %
NEUTROS ABS: 9.5 10*3/uL — AB (ref 1.4–6.5)
NEUTROS PCT: 71 %
Platelets: 160 10*3/uL (ref 150–440)
RBC: 3.99 MIL/uL (ref 3.80–5.20)
RDW: 13.6 % (ref 11.5–14.5)
WBC: 13.5 10*3/uL — ABNORMAL HIGH (ref 3.6–11.0)

## 2016-05-17 MED ORDER — FENTANYL CITRATE (PF) 100 MCG/2ML IJ SOLN: 50.0000 ug | Freq: Once | INTRAMUSCULAR | Status: DC

## 2016-05-17 MED ORDER — MORPHINE SULFATE (PF) 4 MG/ML IV SOLN: 8.0000 mg | Freq: Once | INTRAVENOUS | Status: DC

## 2016-05-17 MED ORDER — ONDANSETRON 4 MG PO TBDP
4.0000 mg | ORAL_TABLET | Freq: Four times a day (QID) | ORAL | Status: DC | PRN
  Administered 2016-05-17: 4 mg via ORAL
  Filled 2016-05-17: qty 1

## 2016-05-17 NOTE — Discharge Instructions (Signed)
Call provider or return to [facility]place with: ? ?1. Regular contractions ?2. Leaking of fluid from your vagina ?3. Vaginal bleeding: Bright red or heavy like a period ?4. Decreased Fetal movement  ?

## 2016-05-17 NOTE — OB Triage Note (Signed)
Called Dr. Georgianne Fick and informed him of the pain she was experiencing.  Patient states she has had right sided lower abdominal pain off and on for the last couple of weeks, however has been constant since Saturday.  Denies vaginal bleeding or leakage of fluids, baby is moving okay.  Patient has a history of cornual ectopic pregnancy December 2016.

## 2016-05-17 NOTE — Final Progress Note (Signed)
Physician Final Progress Note  Patient ID: Kellie Davis MRN: [id number] DOB/AGE: 1985-03-11 31 y.o.  Admit date: 05/17/2016 Admitting provider: [name], MD Discharge date: 05/17/2016   Admission Diagnoses: Right sided abdominal pain at 34 wk6d History of right cornual resection for ectopic pregnancy  Discharge Diagnoses:  Same Consults: None  Significant Findings/ Diagnostic Studies: 31 year old G3 P1011 with EDC=06/22/2016 at 34wk 6d by a 7 week ultrasound who presented with right lower abdominal pain x 2-3 weeks. The pain had become constant and more intense since 4/14. Her pregnancy has been complicated by a history of a right cornual resection and right salpingectomy for a ruptured cornual ectopic in 2016. An MRI was done to rule out appendicitis and to view the right cornual area for possible rupture. The MRI was essentially normal: No free fluid in the pelvis and no appendicitis. Hemoglobin and hematocrit were stable. WBC=13.5.and patient was afebrile. Her pain remained the same at a 5-6/10. She was discharged home on modified bedrest and will follow up at [facility] in 2 days or sooner for worsening pain. A Cesarean section has been scheduled for 37 weeks (2 weeks from tomorrow).   Procedures: MRI  Discharge Condition: stable  Disposition: 01-Home or Self Care  Diet: Regular diet  Discharge Activity: Ambulate in house and no heavy lifting. To begin maternity disability  Discharge Instructions    Discharge patient    Complete by:  As directed    Discharge disposition:  01-Home or Self Care   Discharge patient date:  05/17/2016     Allergies as of 05/17/2016      Reactions   Codeine Anaphylaxis      Medication List    TAKE these medications   Breast Pump Misc Dispense one breast pump for patient   magnesium 30 MG tablet Take 30 mg by mouth 2 (two) times daily.   PRENAVITE PO Take 1 tablet by mouth daily.      Follow-up Information    [facility] Follow up on 05/19/2016.   Specialty:  Obstetrics and Gynecology Why:  make sure ou are seen [place] information: [address] [place] [address] [phone]          Total time spent taking care of this patient: 25 minutes  Signed: Dalia Heading 05/17/2016, 7:48 PM

## 2016-05-19 ENCOUNTER — Ambulatory Visit (INDEPENDENT_AMBULATORY_CARE_PROVIDER_SITE_OTHER): Payer: Managed Care, Other (non HMO) | Admitting: Obstetrics and Gynecology

## 2016-05-19 VITALS — BP 110/70 | Temp 98.8°F | Wt 244.0 lb

## 2016-05-19 DIAGNOSIS — O26893 Other specified pregnancy related conditions, third trimester: Secondary | ICD-10-CM

## 2016-05-19 DIAGNOSIS — O1213 Gestational proteinuria, third trimester: Secondary | ICD-10-CM

## 2016-05-19 DIAGNOSIS — Z3A35 35 weeks gestation of pregnancy: Secondary | ICD-10-CM

## 2016-05-19 DIAGNOSIS — R109 Unspecified abdominal pain: Secondary | ICD-10-CM

## 2016-05-19 NOTE — Progress Notes (Signed)
Right side pain/vaginal pressure

## 2016-05-19 NOTE — Progress Notes (Signed)
No headaches, no vision changes, no RUQ or epigastric pain.  Continues to have RLQ discomfort, negative MRI on 05/17/16.  Urine appears dehydrated will check P/C ratio which should take into account hydration status and preE labs.  Follow up BP check in 1 week.  Caution advised as far as headaches, vision changes, has ability to monitor BP at home and has been normal continue to monitor weight

## 2016-05-20 LAB — COMPREHENSIVE METABOLIC PANEL
ALK PHOS: 99 IU/L (ref 39–117)
ALT: 9 IU/L (ref 0–32)
AST: 15 IU/L (ref 0–40)
Albumin/Globulin Ratio: 1.2 (ref 1.2–2.2)
Albumin: 3 g/dL — ABNORMAL LOW (ref 3.5–5.5)
BUN/Creatinine Ratio: 13 (ref 9–23)
BUN: 6 mg/dL (ref 6–20)
Bilirubin Total: 0.4 mg/dL (ref 0.0–1.2)
CALCIUM: 8.5 mg/dL — AB (ref 8.7–10.2)
CO2: 20 mmol/L (ref 18–29)
CREATININE: 0.48 mg/dL — AB (ref 0.57–1.00)
Chloride: 100 mmol/L (ref 96–106)
GFR calc Af Amer: 150 mL/min/{1.73_m2} (ref >59)
GFR, EST NON AFRICAN AMERICAN: 130 mL/min/{1.73_m2} (ref >59)
GLUCOSE: 78 mg/dL (ref 65–99)
Globulin, Total: 2.6 g/dL (ref 1.5–4.5)
Potassium: 3.7 mmol/L (ref 3.5–5.2)
Sodium: 138 mmol/L (ref 134–144)
TOTAL PROTEIN: 5.6 g/dL — AB (ref 6.0–8.5)

## 2016-05-20 LAB — PROTEIN / CREATININE RATIO, URINE
CREATININE, UR: 174.2 mg/dL
PROTEIN UR: 29.8 mg/dL
PROTEIN/CREAT RATIO: 171 mg/g{creat} (ref 0–200)

## 2016-05-20 LAB — CBC
HEMATOCRIT: 33.6 % — AB (ref 34.0–46.6)
HEMOGLOBIN: 11.2 g/dL (ref 11.1–15.9)
MCH: 30 pg (ref 26.6–33.0)
MCHC: 33.3 g/dL (ref 31.5–35.7)
MCV: 90 fL (ref 79–97)
Platelets: 165 10*3/uL (ref 150–379)
RBC: 3.73 x10E6/uL — AB (ref 3.77–5.28)
RDW: 13.2 % (ref 12.3–15.4)
WBC: 11.1 10*3/uL — AB (ref 3.4–10.8)

## 2016-05-21 ENCOUNTER — Ambulatory Visit (INDEPENDENT_AMBULATORY_CARE_PROVIDER_SITE_OTHER): Payer: Managed Care, Other (non HMO) | Admitting: Certified Nurse Midwife

## 2016-05-21 VITALS — BP 102/62 | Wt 244.0 lb

## 2016-05-21 DIAGNOSIS — O0993 Supervision of high risk pregnancy, unspecified, third trimester: Secondary | ICD-10-CM

## 2016-05-21 DIAGNOSIS — Z3A35 35 weeks gestation of pregnancy: Secondary | ICD-10-CM

## 2016-05-21 NOTE — Progress Notes (Signed)
ROB for BP check due to elevated proteinuria at previous visit. Pt reports no problems.

## 2016-05-21 NOTE — Progress Notes (Signed)
Returns today for a BP check due to +4 proteinuria at last visit. PC ratio returned 171 mgm and CMP was normal/ platelets normal Pain in right lower quadrant about the same Trace proteinuria on dipstick today and normotensive FHTs WNL RTO in 4 days for growth scan/ ROB Scheduled for Cesarean section on 5/1

## 2016-05-25 ENCOUNTER — Ambulatory Visit (INDEPENDENT_AMBULATORY_CARE_PROVIDER_SITE_OTHER): Payer: Managed Care, Other (non HMO)

## 2016-05-25 ENCOUNTER — Ambulatory Visit (INDEPENDENT_AMBULATORY_CARE_PROVIDER_SITE_OTHER): Payer: Managed Care, Other (non HMO) | Admitting: Obstetrics and Gynecology

## 2016-05-25 VITALS — BP 106/68 | Wt 246.0 lb

## 2016-05-25 DIAGNOSIS — O99213 Obesity complicating pregnancy, third trimester: Secondary | ICD-10-CM | POA: Diagnosis not present

## 2016-05-25 DIAGNOSIS — O0993 Supervision of high risk pregnancy, unspecified, third trimester: Secondary | ICD-10-CM

## 2016-05-25 DIAGNOSIS — Z8759 Personal history of other complications of pregnancy, childbirth and the puerperium: Secondary | ICD-10-CM | POA: Diagnosis not present

## 2016-05-25 DIAGNOSIS — Z3A33 33 weeks gestation of pregnancy: Secondary | ICD-10-CM | POA: Diagnosis not present

## 2016-05-25 DIAGNOSIS — Z3685 Encounter for antenatal screening for Streptococcus B: Secondary | ICD-10-CM

## 2016-05-25 DIAGNOSIS — R109 Unspecified abdominal pain: Secondary | ICD-10-CM

## 2016-05-25 DIAGNOSIS — Z3A36 36 weeks gestation of pregnancy: Secondary | ICD-10-CM

## 2016-05-25 NOTE — Progress Notes (Signed)
Contractions/headache

## 2016-05-27 ENCOUNTER — Encounter: Payer: Self-pay | Admitting: Obstetrics and Gynecology

## 2016-05-28 ENCOUNTER — Encounter: Payer: Self-pay | Admitting: Obstetrics and Gynecology

## 2016-05-28 ENCOUNTER — Ambulatory Visit (INDEPENDENT_AMBULATORY_CARE_PROVIDER_SITE_OTHER): Payer: Managed Care, Other (non HMO) | Admitting: Obstetrics and Gynecology

## 2016-05-28 VITALS — BP 100/70 | HR 102 | Ht 63.5 in | Wt 240.0 lb

## 2016-05-28 DIAGNOSIS — Z8759 Personal history of other complications of pregnancy, childbirth and the puerperium: Secondary | ICD-10-CM

## 2016-05-28 DIAGNOSIS — O0993 Supervision of high risk pregnancy, unspecified, third trimester: Secondary | ICD-10-CM

## 2016-05-28 DIAGNOSIS — Z3A36 36 weeks gestation of pregnancy: Secondary | ICD-10-CM

## 2016-05-28 DIAGNOSIS — Z8639 Personal history of other endocrine, nutritional and metabolic disease: Secondary | ICD-10-CM

## 2016-05-28 DIAGNOSIS — O368390 Maternal care for abnormalities of the fetal heart rate or rhythm, unspecified trimester, not applicable or unspecified: Secondary | ICD-10-CM

## 2016-05-28 DIAGNOSIS — O99210 Obesity complicating pregnancy, unspecified trimester: Secondary | ICD-10-CM

## 2016-05-28 DIAGNOSIS — Z01818 Encounter for other preprocedural examination: Secondary | ICD-10-CM

## 2016-05-28 NOTE — Progress Notes (Signed)
Obstetrics & Gynecology Surgery H&P    Chief Complaint: Scheduled Surgery   History of Present Illness: Patient is a 31 y.o. [id number] presenting for scheduled primary cesarean section at 18 weeks for delivery in setting of prior uterine surgery (resection of uterine cornua for ruptured cornual ectopic pregnancy).   Her pregnancy has otherwise been notable for fetal arhythmia in mid second trimester wich resolved and she underwent normal fetal echo.   Clinic [facility] Prenatal Labs  Dating  7 week [place] Blood type:   A pos  Genetic Screen 1 Screen:    AFP:     Quad:     NIPS: Antibody:  neg  Anatomic [place] Normal Rubella: Immune (10/05 0000)  GTT Early:               Third trimester: 86 RPR: Non Reactive (02/28 1155)   Flu vaccine 12/03/15 HBsAg: Negative (10/05 0000)   TDaP vaccine 04/19/16 HIV: Non Reactive (02/28 1155)   Baby Food     Breast                                         GBS: (For PCN allergy, check sensitivities)  Contraception  Pap: NIL  Circumcision  Fetal arhythmia normal cardiac echo resolved  Pediatrician    Support Person       Review of Systems:10 point review of systems  Past Medical History:  Past Medical History:  Diagnosis Date  . Anxiety disorder, unspecified    Situational  . Constipation   . Ectopic pregnancy    Right cornual ectopic  . Hypothyroidism   . Vaginal Pap smear, abnormal 2014/2015    Past Surgical History:  Past Surgical History:  Procedure Laterality Date  . BREAST ENHANCEMENT SURGERY  2014  . COLPOSCOPY  02/25/2012  . LAPAROSCOPIC SALPINGO OOPHERECTOMY Right 01/06/2015   Procedure: LAPAROSCOPIC right SALPINGECTOMYwith removal of ectopic and partial  right corneal resection ;  no oophorectomySurgeon: [name], MD;  Location: [facility] ORS;  Service: Gynecology;  Laterality: Right;  . TONSILLECTOMY  2014    Family History:  Family History  Problem Relation Age of Onset  . Heart disease Mother   . Diabetes Mother     Social  History:  Social History   Social History  . Marital status: Married    Spouse name: N/A  . Number of children: N/A  . Years of education: N/A   Occupational History  . Not on file.   Social History Main Topics  . Smoking status: Never Smoker  . Smokeless tobacco: Never Used  . Alcohol use Yes     Comment: occas  . Drug use: No  . Sexual activity: Yes    Birth control/ protection: None   Other Topics Concern  . Not on file   Social History Narrative  . No narrative on file    Allergies:  Allergies  Allergen Reactions  . Codeine Anaphylaxis    Medications: Prior to Admission medications   Medication Sig Start Date End Date Taking? Authorizing Provider  acetaminophen (TYLENOL) 500 MG tablet Take 500 mg by mouth 2 (two) times daily as needed for moderate pain or headache.    Historical Provider, MD  Magnesium 250 MG TABS Take 250 mg by mouth daily.    Historical Provider, MD  Misc. Devices (BREAST PUMP) MISC Dispense one breast pump for patient 05/12/16  [name], MD  Prenatal Vit-Fe Fumarate-FA (PRENAVITE PO) Take 1 tablet by mouth daily.    Historical Provider, MD    Physical Exam Vitals: Blood pressure 100/70, pulse (!) 102, height 5' 3.5" (1.613 m), weight 240 lb (108.9 kg). General: NAD HEENT: normocephalic, anicteric Pulmonary: No increased work of breathing Cardiovascular: RRR, distal pulses 2+ Abdomen: Gravid, soft, non-tender, FHT 135 Extremities: no edema, erythema, or tenderness Neurologic: Grossly intact Psychiatric: mood appropriate, affect full  Imaging Mr Pelvis Wo Contrast  Result Date: 05/17/2016 CLINICAL DATA:  31 year old female with history of right-sided lower abdominal pain intermittently for the past couple of weeks, worsening and now constant since Saturday. Thirty-five week pregnant patient. EXAM: MRI ABDOMEN AND PELVIS WITHOUT CONTRAST TECHNIQUE: Multiplanar multisequence MR imaging of the abdomen and pelvis was performed. No  intravenous contrast was administered. COMPARISON:  None. FINDINGS: COMBINED FINDINGS FOR BOTH MR ABDOMEN AND PELVIS Lower chest: Bilateral breast implants incidentally noted. Hepatobiliary: No definite cystic or solid hepatic lesions are identified on today's noncontrast examination. No intra or extrahepatic biliary ductal dilatation. Gallbladder is normal in appearance. Pancreas: No pancreatic mass or peripancreatic inflammatory changes noted on today's noncontrast examination. No pancreatic ductal dilatation. Spleen:  Unremarkable. Adrenals/Urinary Tract: Bilateral adrenal glands are poorly visualized, but no retroperitoneal mass is noted to suggest and adrenal lesion. Bilateral kidneys are normal in appearance. No hydroureteronephrosis. Urinary bladder is unremarkable in appearance. Stomach/Bowel: Poorly demonstrated secondary to susceptibility artifact, but generally unremarkable. Specifically, no pathologic dilatation of small bowel or colon. Normal appendix. Vascular/Lymphatic: No aneurysm identified in the abdominal or pelvic vasculature. No lymphadenopathy noted in the abdomen or pelvis. Reproductive: Gravid uterus with single IUP in cephalic position. Placenta is anterior, with the bulk of the placenta to the left of midline. The inferior margin of the placenta terminates well above the level of the cervix. Cervical length is estimated to be 4.2 cm. Other: No significant volume of ascites noted in the peritoneal cavity. Musculoskeletal: No aggressive osseous lesions are noted in the visualized portions of the skeleton. IMPRESSION: 1. Normal appendix. 2. Single IUP in cephalic position. 3. Cervix is closed with an estimated cervical length of 4.2 cm. 4. Anteriorly located placenta, with no evidence to suggest placenta previa. Electronically Signed   By: Vinnie Langton M.D.   On: 05/17/2016 19:11   Mr Abdomen Wo Contrast  Result Date: 05/17/2016 CLINICAL DATA:  32 year old female with history of  right-sided lower abdominal pain intermittently for the past couple of weeks, worsening and now constant since Saturday. Thirty-five week pregnant patient. EXAM: MRI ABDOMEN AND PELVIS WITHOUT CONTRAST TECHNIQUE: Multiplanar multisequence MR imaging of the abdomen and pelvis was performed. No intravenous contrast was administered. COMPARISON:  None. FINDINGS: COMBINED FINDINGS FOR BOTH MR ABDOMEN AND PELVIS Lower chest: Bilateral breast implants incidentally noted. Hepatobiliary: No definite cystic or solid hepatic lesions are identified on today's noncontrast examination. No intra or extrahepatic biliary ductal dilatation. Gallbladder is normal in appearance. Pancreas: No pancreatic mass or peripancreatic inflammatory changes noted on today's noncontrast examination. No pancreatic ductal dilatation. Spleen:  Unremarkable. Adrenals/Urinary Tract: Bilateral adrenal glands are poorly visualized, but no retroperitoneal mass is noted to suggest and adrenal lesion. Bilateral kidneys are normal in appearance. No hydroureteronephrosis. Urinary bladder is unremarkable in appearance. Stomach/Bowel: Poorly demonstrated secondary to susceptibility artifact, but generally unremarkable. Specifically, no pathologic dilatation of small bowel or colon. Normal appendix. Vascular/Lymphatic: No aneurysm identified in the abdominal or pelvic vasculature. No lymphadenopathy noted in the abdomen or pelvis. Reproductive: Gravid  uterus with single IUP in cephalic position. Placenta is anterior, with the bulk of the placenta to the left of midline. The inferior margin of the placenta terminates well above the level of the cervix. Cervical length is estimated to be 4.2 cm. Other: No significant volume of ascites noted in the peritoneal cavity. Musculoskeletal: No aggressive osseous lesions are noted in the visualized portions of the skeleton. IMPRESSION: 1. Normal appendix. 2. Single IUP in cephalic position. 3. Cervix is closed with an  estimated cervical length of 4.2 cm. 4. Anteriorly located placenta, with no evidence to suggest placenta previa. Electronically Signed   By: Vinnie Langton M.D.   On: 05/17/2016 19:11   US Ob Follow Up  Result Date: 05/03/2016 Please see Notes or Procedures tab for imaging impression.   Assessment: 31 y.o. G3P1011 presenting for scheduled LTCS at [redacted] weeks gestation  Problem List Items Addressed This Visit      Other   History of hypothyroidism   High-risk pregnancy supervision, third trimester   Fetal arrhythmia affecting pregnancy, antepartum   History of ectopic pregnancy    Other Visit Diagnoses    Preop examination    -  Primary   [redacted] weeks gestation of pregnancy       Maternal obesity, antepartum          Plan: 1) The patient was counseled regarding risk and benefits to proceeding with Cesarean section Risk of cesarean section were discussed including risk of bleeding and need for potential intraoperative or postoperative blood transfusion with a rate of approximately 5% quoted for all Cesarean sections, risk of injury to adjacent organs including but not limited to bowl and bladder, the need for additional surgical procedures to address such injuries, and the risk of infection.  The risk of attempted vaginal delivery in setting of prior insult to contractile portion of myometrium were disussed.  After consideration of options the patient is amenable to proceed with primary cesarean section for delivery.  2) Routine postoperative instructions were reviewed with the patient and her family in detail today including the expected length of recovery and likely postoperative course.  The patient concurred with the proposed plan, giving informed written consent for the surgery today.  Patient instructed on the importance of being NPO after midnight prior to her procedure.  If warranted preoperative prophylactic antibiotics and SCDs ordered on call to the OR to meet SCIP guidelines and  adhere to recommendation laid forth in [place] Number 104 May 2009  "Antibiotic Prophylaxis for Gynecologic Procedures".

## 2016-05-29 LAB — CULTURE, BETA STREP (GROUP B ONLY): Strep Gp B Culture: NEGATIVE

## 2016-05-30 ENCOUNTER — Inpatient Hospital Stay
Admission: EM | Admit: 2016-05-30 | Discharge: 2016-05-31 | DRG: 781 | Disposition: A | Discharge status: Home / Self Care | Payer: Managed Care, Other (non HMO) | Attending: Advanced Practice Midwife | Admitting: Advanced Practice Midwife

## 2016-05-30 DIAGNOSIS — R112 Nausea with vomiting, unspecified: Secondary | ICD-10-CM | POA: Diagnosis not present

## 2016-05-30 DIAGNOSIS — O99613 Diseases of the digestive system complicating pregnancy, third trimester: Secondary | ICD-10-CM | POA: Diagnosis present

## 2016-05-30 DIAGNOSIS — O99283 Endocrine, nutritional and metabolic diseases complicating pregnancy, third trimester: Secondary | ICD-10-CM | POA: Diagnosis present

## 2016-05-30 DIAGNOSIS — K529 Noninfective gastroenteritis and colitis, unspecified: Secondary | ICD-10-CM | POA: Diagnosis present

## 2016-05-30 DIAGNOSIS — E860 Dehydration: Secondary | ICD-10-CM | POA: Diagnosis present

## 2016-05-30 DIAGNOSIS — O99213 Obesity complicating pregnancy, third trimester: Secondary | ICD-10-CM | POA: Diagnosis present

## 2016-05-30 DIAGNOSIS — Z3A36 36 weeks gestation of pregnancy: Secondary | ICD-10-CM | POA: Diagnosis not present

## 2016-05-30 DIAGNOSIS — Z6841 Body Mass Index (BMI) 40.0 and over, adult: Secondary | ICD-10-CM

## 2016-05-30 DIAGNOSIS — R509 Fever, unspecified: Secondary | ICD-10-CM | POA: Diagnosis present

## 2016-05-30 DIAGNOSIS — R197 Diarrhea, unspecified: Secondary | ICD-10-CM | POA: Diagnosis not present

## 2016-05-30 DIAGNOSIS — O26893 Other specified pregnancy related conditions, third trimester: Secondary | ICD-10-CM | POA: Diagnosis not present

## 2016-05-30 LAB — COMPREHENSIVE METABOLIC PANEL
ALT: 13 U/L — ABNORMAL LOW (ref 14–54)
ANION GAP: 12 (ref 5–15)
AST: 27 U/L (ref 15–41)
Albumin: 3.1 g/dL — ABNORMAL LOW (ref 3.5–5.0)
Alkaline Phosphatase: 112 U/L (ref 38–126)
BILIRUBIN TOTAL: 1.3 mg/dL — AB (ref 0.3–1.2)
BUN: 6 mg/dL (ref 6–20)
CO2: 23 mmol/L (ref 22–32)
Calcium: 9.1 mg/dL (ref 8.9–10.3)
Chloride: 103 mmol/L (ref 101–111)
Creatinine, Ser: 0.53 mg/dL (ref 0.44–1.00)
GFR calc Af Amer: >60 mL/min (ref >60)
Glucose, Bld: 79 mg/dL (ref 65–99)
POTASSIUM: 3.8 mmol/L (ref 3.5–5.1)
Sodium: 138 mmol/L (ref 135–145)
TOTAL PROTEIN: 7 g/dL (ref 6.5–8.1)

## 2016-05-30 LAB — URINALYSIS, COMPLETE (UACMP) WITH MICROSCOPIC
BILIRUBIN URINE: NEGATIVE
GLUCOSE, UA: NEGATIVE mg/dL
HGB URINE DIPSTICK: NEGATIVE
KETONES UR: 80 mg/dL — AB
LEUKOCYTES UA: NEGATIVE
Nitrite: NEGATIVE
PH: 7 (ref 5.0–8.0)
PROTEIN: NEGATIVE mg/dL
Specific Gravity, Urine: 1.018 (ref 1.005–1.030)

## 2016-05-30 LAB — CBC
HEMATOCRIT: 39.5 % (ref 35.0–47.0)
HEMOGLOBIN: 13.4 g/dL (ref 12.0–16.0)
MCH: 30.3 pg (ref 26.0–34.0)
MCHC: 33.8 g/dL (ref 32.0–36.0)
MCV: 89.7 fL (ref 80.0–100.0)
Platelets: 158 10*3/uL (ref 150–440)
RBC: 4.4 MIL/uL (ref 3.80–5.20)
RDW: 13.9 % (ref 11.5–14.5)
WBC: 13.4 10*3/uL — AB (ref 3.6–11.0)

## 2016-05-30 MED ORDER — ACETAMINOPHEN 500 MG PO TABS
1000.0000 mg | ORAL_TABLET | Freq: Four times a day (QID) | ORAL | Status: DC | PRN
  Administered 2016-05-30: 1000 mg via ORAL
  Filled 2016-05-30: qty 2

## 2016-05-30 MED ORDER — PROMETHAZINE HCL 25 MG/ML IJ SOLN: 12.5000 mg | Freq: Four times a day (QID) | INTRAMUSCULAR | Status: DC | PRN

## 2016-05-30 MED ORDER — LACTATED RINGERS IV SOLN
500.0000 mL | INTRAVENOUS | Status: DC | PRN
Start: 2016-05-30 — End: 2016-05-31
  Administered 2016-05-30: 500 mL via INTRAVENOUS

## 2016-05-30 MED ORDER — TERBUTALINE SULFATE 1 MG/ML IJ SOLN
0.2500 mg | Freq: Once | INTRAMUSCULAR | Status: AC
  Administered 2016-05-30: 0.25 mg via SUBCUTANEOUS
  Filled 2016-05-30: qty 1

## 2016-05-30 MED ORDER — PROMETHAZINE HCL 25 MG PO TABS
25.0000 mg | ORAL_TABLET | Freq: Four times a day (QID) | ORAL | Status: DC | PRN
  Administered 2016-05-30: 25 mg via ORAL
  Filled 2016-05-30: qty 1

## 2016-05-30 MED ORDER — DEXTROSE IN LACTATED RINGERS 5 % IV SOLN
INTRAVENOUS | Status: DC
  Administered 2016-05-30 – 2016-05-31 (×2): via INTRAVENOUS

## 2016-05-30 MED ORDER — FAMOTIDINE IN NACL 20-0.9 MG/50ML-% IV SOLN
20.0000 mg | Freq: Two times a day (BID) | INTRAVENOUS | Status: DC
  Administered 2016-05-30: 20 mg via INTRAVENOUS
  Filled 2016-05-30 (×2): qty 50

## 2016-05-30 MED ORDER — SODIUM CHLORIDE 0.9 % IV SOLN
8.0000 mg | Freq: Three times a day (TID) | INTRAVENOUS | Status: DC | PRN
  Administered 2016-05-30: 8 mg via INTRAVENOUS
  Filled 2016-05-30: qty 4

## 2016-05-30 NOTE — H&P (Signed)
OB History & Physical   History of Present Illness:  Chief Complaint:  "I woke up this AM with nausea/ vomiting and loose stools. I am also running a fever." HPI:  Kellie Davis is a 31 y.o. G75P1011 female with EDC=06/22/2016 at [redacted]w[redacted]d dated by a 7week ultrasound. Her PNC has been at [facility].  Her pregnancy has been significantly complicated by prior uterine surgery (resection of right uterine cornua for ruptured cornual ectopic pregnancy).  She has a Cesarean scheduled for 37 weeks (2 days). She is obese with a starting BMI of 37 and she has gained 20 # with the pregnancy.   Her pregnancy has also been notable for fetal arhythmia in mid second trimester which resolved. A fetal echo at that time was normal. She also had a MRI for persistent RLQ pain a couple of weeks ago which was normal. She presents to L&D for evaluation of nausea, vomiting and diarrhea.  She reports vomiting 10 times this AM. She has not been able to keep down food or fluids since she ate at [place] last night at 7 PM last night. Has had 2 watery stools this Am. No blood in stool.   Feels some tightening of her uterus. Denies increased RLQ pain. Has noticed more backache and lower abdominal pressure with contractions. Baby active.       Maternal Medical History:   Past Medical History:  Diagnosis Date  . Anxiety disorder, unspecified    Situational  . Constipation   . Ectopic pregnancy    Right cornual ectopic  . Hypothyroidism   . Obesity (BMI 35.0-39.9 without comorbidity)   . Vaginal Pap smear, abnormal 2014/2015    Past Surgical History:  Procedure Laterality Date  . BREAST ENHANCEMENT SURGERY  2014  . COLPOSCOPY  02/25/2012  . LAPAROSCOPIC SALPINGO OOPHERECTOMY Right 01/06/2015   Procedure: LAPAROSCOPIC right SALPINGECTOMYwith removal of ectopic and partial  right corneal resection ;  no oophorectomySurgeon: [name], MD;  Location: [facility] ORS;  Service: Gynecology;  Laterality: Right;   . TONSILLECTOMY  2014    Allergies  Allergen Reactions  . Codeine Anaphylaxis    Prior to Admission medications   Medication Sig Start Date End Date Taking? Authorizing Provider  acetaminophen (TYLENOL) 500 MG tablet Take 500 mg by mouth 2 (two) times daily as needed for moderate pain or headache.   Yes Historical Provider, MD  Magnesium 250 MG TABS Take 250 mg by mouth daily.   Yes Historical Provider, MD  Prenatal Vit-Fe Fumarate-FA (PRENAVITE PO) Take 1 tablet by mouth daily.   Yes Historical Provider, MD  Misc. Devices (BREAST PUMP) MISC Dispense one breast pump for patient 05/12/16   [name], MD          Social History: She  reports that she has never smoked. She has never used smokeless tobacco. She reports that she does not drink alcohol or use drugs.  Family History: family history includes Diabetes in her mother; Heart disease in her mother; Hyperlipidemia in her mother.   Review of Systems:Review of Systems  Constitutional: Positive for chills and fever. Negative for weight loss.  HENT: Negative for congestion, sinus pain and sore throat.   Eyes: Negative for blurred vision and pain.  Respiratory: Negative for cough, hemoptysis, shortness of breath and wheezing.   Cardiovascular: Positive for chest pain (in pectoral area). Negative for palpitations and leg swelling.       Positive for tachycardia  Gastrointestinal: Positive for abdominal pain, diarrhea,  heartburn, nausea and vomiting.  Genitourinary: Positive for frequency. Negative for dysuria, hematuria and urgency.       Negative for vaginal bleeding or leakage of fluid  Musculoskeletal: Positive for back pain. Negative for joint pain and myalgias.  Skin: Negative for itching and rash.  Neurological: Negative for dizziness, tingling and headaches.  Endo/Heme/Allergies: Negative for environmental allergies and polydipsia. Does not bruise/bleed easily.       Negative for hirsutism    Psychiatric/Behavioral: Negative for depression. The patient is nervous/anxious.       Physical Exam:  Vital Signs: BP 129/68   Pulse (!) 122   Temp (!) 101.1 F (38.4 C) (Axillary)   Resp (!) 24   Ht 5' 3.5" (1.613 m)   Wt 104.3 kg (230 lb)   BMI 40.10 kg/m  General: gravid WF appears flushed HEENT: normocephalic, atraumatic Heart:tachycardic with normal rhythm..  No murmurs Lungs: clear to auscultation bilaterally Abdomen: soft, gravid, bowel sounds active. Mild tenderness on right fundal area and RLQ over right lower border of uterus. Pelvic:   External: Normal external female genitalia  Cervix: FT/ TH/ OOP  Extremities: non-tender, symmetric, trace edema bilaterally.  DTRs: +2  Neurologic: Alert & oriented x 3.    Pertinent Results:  Prenatal Labs: Blood type/Rh A positive  Antibody screen negative  Rubella immune  RPR Non reactive  HBsAg negative  HIV Non reactive  GC negative  Chlamydia negative  Genetic screening Normal XX on cell free DNA test  1 hour GTT 86  3 hour GTT NA  GBS negative on 05/25/2016   Baseline FHR: 175-180 with accelerations to 200, moderate variabilit Toco: contractions every 3-4 min apart, palpate mild   Assessment:  Kellie Davis is a 31 y.o. G1P1011 female at [redacted]w[redacted]d with acute gastroenteritis. Fetal tachycardia probably due to maternal fever-Cat 2 Dehydration due to nausea and vomiting   Plan:  1. Admit to Labor & Delivery for observation and intravenous hydration  2. CBC, T&S, CMP, UA, RPR 3. LR bolus  4. Zofran or phenergan IV 5. Tylenol for fever after #4 6. NPO until no vomiting x 4 hours 7. Have discussed POM with Dr Enzo Bi   Dalia Heading  05/30/2016 4:22 PM

## 2016-05-30 NOTE — Progress Notes (Addendum)
S: Feeling less nauseous after Phenergan tablet and pepcid IV, but contractions are becoming more intense. O: BP (!) 97/48 (BP Location: Right Arm)   Pulse (!) 111   Temp 99.2 F (37.3 C) (Axillary)   Resp (!) 24   Ht 5' 3.5" (1.613 m)   Wt 104.3 kg (230 lb)   SpO2 96%   BMI 40.10 kg/m   Pulse now 96 General: resting quietly, in NAd FHR 135 with accelerations to 150s to 160s with moderate variability Toco: contractions q 2-8 min apart. Contractions had spaced out for a while after IV hydration started, but have slowly become more regular and lasting longer Results for orders placed or performed during the hospital encounter of 05/30/16 (from the past 24 hour(s))  Type and screen [place]     Status: None (Preliminary result)   Collection Time: 05/30/16  3:08 PM  Result Value Ref Range   ABO/RH(D) PENDING    Antibody Screen PENDING    Sample Expiration 06/02/2016   Urinalysis, Complete w Microscopic     Status: Abnormal   Collection Time: 05/30/16  3:08 PM  Result Value Ref Range   Color, Urine YELLOW (A) YELLOW   APPearance HAZY (A) CLEAR   Specific Gravity, Urine 1.018 1.005 - 1.030   pH 7.0 5.0 - 8.0   Glucose, UA NEGATIVE NEGATIVE mg/dL   Hgb urine dipstick NEGATIVE NEGATIVE   Bilirubin Urine NEGATIVE NEGATIVE   Ketones, ur 80 (A) NEGATIVE mg/dL   Protein, ur NEGATIVE NEGATIVE mg/dL   Nitrite NEGATIVE NEGATIVE   Leukocytes, UA NEGATIVE NEGATIVE   RBC / HPF 0-5 0 - 5 RBC/hpf   WBC, UA 0-5 0 - 5 WBC/hpf   Bacteria, UA RARE (A) NONE SEEN   Squamous Epithelial / LPF 6-30 (A) NONE SEEN   Mucous PRESENT   Comprehensive metabolic panel     Status: Abnormal   Collection Time: 05/30/16  3:08 PM  Result Value Ref Range   Sodium 138 135 - 145 mmol/L   Potassium 3.8 3.5 - 5.1 mmol/L   Chloride 103 101 - 111 mmol/L   CO2 23 22 - 32 mmol/L   Glucose, Bld 79 65 - 99 mg/dL   BUN 6 6 - 20 mg/dL   Creatinine, Ser 0.53 0.44 - 1.00 mg/dL   Calcium 9.1 8.9 -  10.3 mg/dL   Total Protein 7.0 6.5 - 8.1 g/dL   Albumin 3.1 (L) 3.5 - 5.0 g/dL   AST 27 15 - 41 U/L   ALT 13 (L) 14 - 54 U/L   Alkaline Phosphatase 112 38 - 126 U/L   Total Bilirubin 1.3 (H) 0.3 - 1.2 mg/dL   GFR calc non Af Amer >60 >60 mL/min   GFR calc Af Amer >60 >60 mL/min   Anion gap 12 5 - 15  CBC     Status: Abnormal   Collection Time: 05/30/16  3:08 PM  Result Value Ref Range   WBC 13.4 (H) 3.6 - 11.0 K/uL   RBC 4.40 3.80 - 5.20 MIL/uL   Hemoglobin 13.4 12.0 - 16.0 g/dL   HCT 39.5 35.0 - 47.0 %   MCV 89.7 80.0 - 100.0 fL   MCH 30.3 26.0 - 34.0 pg   MCHC 33.8 32.0 - 36.0 g/dL   RDW 13.9 11.5 - 14.5 %   Platelets 158 150 - 440 K/uL  Type and screen     Status: None (Preliminary result)   Collection Time: 05/30/16  4:10  PM  Result Value Ref Range   ABO/RH(D) PENDING    Antibody Screen PENDING    Sample Expiration 06/02/2016    A: Nausea and vomiting improved Preterm contractions  P: Terbutaline 0.25 mgm subcut now Can have some sips of clear liquid Note: patient to have her preop appt tomorrow at 0900 at preadmit If not discharged by then will need to contact preadmissions tomorrow and have her RPR and T&S redrawn (hemolyzed specimen today).  Dalia Heading, CNM   Addendum: Contractions have abated with terbutaline. Sipping on some ginger ale  Dalia Heading, CNM  9:30 PM

## 2016-05-30 NOTE — OB Triage Note (Signed)
Pt presents with NVD starting around 0745 this morning.  Axillary temp of 101.1 F.  Pt denies being able to hold down any food or fluids and reports lower back pain that is throbbing and constant. Evert Kohl CNM at bedside to assess.

## 2016-05-31 ENCOUNTER — Inpatient Hospital Stay: Admission: RE | Admit: 2016-05-31 | Payer: BLUE CROSS/BLUE SHIELD | Source: Ambulatory Visit

## 2016-05-31 DIAGNOSIS — R197 Diarrhea, unspecified: Secondary | ICD-10-CM

## 2016-05-31 DIAGNOSIS — Z3A36 36 weeks gestation of pregnancy: Secondary | ICD-10-CM

## 2016-05-31 DIAGNOSIS — O26893 Other specified pregnancy related conditions, third trimester: Secondary | ICD-10-CM

## 2016-05-31 DIAGNOSIS — R112 Nausea with vomiting, unspecified: Secondary | ICD-10-CM

## 2016-05-31 LAB — CBC WITH DIFFERENTIAL/PLATELET
BASOS PCT: 0 %
Basophils Absolute: 0 10*3/uL (ref 0–0.1)
Eosinophils Absolute: 0 10*3/uL (ref 0–0.7)
Eosinophils Relative: 1 %
HEMATOCRIT: 32.9 % — AB (ref 35.0–47.0)
HEMOGLOBIN: 10.9 g/dL — AB (ref 12.0–16.0)
Lymphocytes Relative: 15 %
Lymphs Abs: 1.1 10*3/uL (ref 1.0–3.6)
MCH: 29.7 pg (ref 26.0–34.0)
MCHC: 33.2 g/dL (ref 32.0–36.0)
MCV: 89.5 fL (ref 80.0–100.0)
MONOS PCT: 8 %
Monocytes Absolute: 0.6 10*3/uL (ref 0.2–0.9)
NEUTROS ABS: 5.4 10*3/uL (ref 1.4–6.5)
NEUTROS PCT: 76 %
Platelets: 135 10*3/uL — ABNORMAL LOW (ref 150–440)
RBC: 3.68 MIL/uL — AB (ref 3.80–5.20)
RDW: 14 % (ref 11.5–14.5)
WBC: 7.1 10*3/uL (ref 3.6–11.0)

## 2016-05-31 NOTE — Progress Notes (Signed)
S: Slept for a couple hours. No contractions. No nausea vomiting. Has kept down some crackers and gingerale.   O: BP 105/67 (BP Location: Left Arm)   Pulse 100   Temp 98.6 F (37 C) (Oral)   Resp 14   Ht 5' 3.5" (1.613 m)   Wt 104.3 kg (230 lb)   SpO2 98%   BMI 40.10 kg/m    General: apppears comfortable in NAD Off monitors since 0300 Has signed consents for surgery-preadmission to send them to L&D  A: Resolving acute gastroenteritis Scheduled for Cesarean section tomorrow AM  P: Clear liquids this AM, if tolerated can be discharged NPO after midnight for surgery in Am. RPR, CBC, and T&S ordered preop before discharge (hemolyzed last night)  Dalia Heading, CNM

## 2016-05-31 NOTE — Discharge Summary (Signed)
Physician Final Progress Note  Patient ID: BOWEN GOYAL MRN: [id number] DOB/AGE: 09-19-1985 31 y.o.  Admit date: 05/30/2016 Admitting provider: [name], MD Discharge date: 05/31/2016   Admission Diagnoses: nausea, vomiting, fever  Discharge Diagnoses:  Active Problems:   Nausea and vomiting IUP at 48w6dwith reactive NST, not in labor, GI upset resolved  History of Present Illness: The patient is a 31y.o. female G3P1011 at 359w6dho presents for nausea and vomiting beginning the morning of 05/30/2016. She had been unable to keep anything down overnight and into the morning. She was dehydrated and febrile on admission. She was also having contractions q 4 minutes on admission. She was admitted for IV hydration, labs, NST, tocolysis. She is scheduled for c/section tomorrow morning and was given preop instructions prior to discharge. At discharge the patient reports good fetal movement. She denies contractions, LOF, VB, N/V. She is tolerating clear liquids.  Past Medical History:  Diagnosis Date  . Anxiety disorder, unspecified    Situational  . Constipation   . Ectopic pregnancy    Right cornual ectopic  . Hypothyroidism   . Obesity (BMI 35.0-39.9 without comorbidity)   . Vaginal Pap smear, abnormal 2014/2015    Past Surgical History:  Procedure Laterality Date  . BREAST ENHANCEMENT SURGERY  2014  . COLPOSCOPY  02/25/2012  . LAPAROSCOPIC SALPINGO OOPHERECTOMY Right 01/06/2015   Procedure: LAPAROSCOPIC right SALPINGECTOMYwith removal of ectopic and partial  right corneal resection ;  no oophorectomySurgeon: AnMalachy MoodMD;  Location: [facility] ORS;  Service: Gynecology;  Laterality: Right;  . TONSILLECTOMY  2014    No current facility-administered medications on file prior to encounter.    Current Outpatient Prescriptions on File Prior to Encounter  Medication Sig Dispense Refill  . acetaminophen (TYLENOL) 500 MG tablet Take 500 mg by mouth 2 (two) times daily as  needed for moderate pain or headache.    . Magnesium 250 MG TABS Take 250 mg by mouth daily.    . Prenatal Vit-Fe Fumarate-FA (PRENAVITE PO) Take 1 tablet by mouth daily.    . Misc. Devices (BREAST PUMP) MISC Dispense one breast pump for patient 1 each 0    Allergies  Allergen Reactions  . Codeine Anaphylaxis    Social History   Social History  . Marital status: Married    Spouse name: N/A  . Number of children: N/A  . Years of education: N/A   Occupational History  . Not on file.   Social History Main Topics  . Smoking status: Never Smoker  . Smokeless tobacco: Never Used  . Alcohol use No     Comment: occas  . Drug use: No  . Sexual activity: Yes    Birth control/ protection: None   Other Topics Concern  . Not on file   Social History Narrative  . No narrative on file    Physical Exam: BP 105/67 (BP Location: Left Arm)   Pulse 100   Temp 98.6 F (37 C) (Oral)   Resp 14   Ht 5' 3.5" (1.613 m)   Wt 230 lb (104.3 kg)   SpO2 98%   BMI 40.10 kg/m   Gen: NAD CV: RRR Pulm: CTAB Pelvic: .5/thick Toco: no contractions at discharge Fetal Well Being: 140 bpm, moderate variability, +accelerations, -decelerations Ext: no evidence of DVT  Consults: None  Significant Findings/ Diagnostic Studies: labs:   Results for MITAMURA, LASKYMRN [id number] of 05/31/2016 09:29  Ref. Range 05/30/2016 15:08 05/31/2016 08:59  COMPREHENSIVE  METABOLIC PANEL Unknown Rpt (A)   Sodium Latest Ref Range: 135 - 145 mmol/L 138   Potassium Latest Ref Range: 3.5 - 5.1 mmol/L 3.8   Chloride Latest Ref Range: 101 - 111 mmol/L 103   CO2 Latest Ref Range: 22 - 32 mmol/L 23   Glucose Latest Ref Range: 65 - 99 mg/dL 79   BUN Latest Ref Range: 6 - 20 mg/dL 6   Creatinine Latest Ref Range: 0.44 - 1.00 mg/dL 0.53   Calcium Latest Ref Range: 8.9 - 10.3 mg/dL 9.1   Anion gap Latest Ref Range: 5 - 15  12   Alkaline Phosphatase Latest Ref Range: 38 - 126 U/L 112   Albumin Latest Ref Range:  3.5 - 5.0 g/dL 3.1 (L)   AST Latest Ref Range: 15 - 41 U/L 27   ALT Latest Ref Range: 14 - 54 U/L 13 (L)   Total Protein Latest Ref Range: 6.5 - 8.1 g/dL 7.0   Total Bilirubin Latest Ref Range: 0.3 - 1.2 mg/dL 1.3 (H)   EGFR (African American) Latest Ref Range: >60 mL/min >60   EGFR (Non-African Amer.) Latest Ref Range: >60 mL/min >60   WBC Latest Ref Range: 3.6 - 11.0 K/uL 13.4 (H) 7.1  RBC Latest Ref Range: 3.80 - 5.20 MIL/uL 4.40 3.68 (L)  Hemoglobin Latest Ref Range: 12.0 - 16.0 g/dL 13.4 10.9 (L)  HCT Latest Ref Range: 35.0 - 47.0 % 39.5 32.9 (L)  MCV Latest Ref Range: 80.0 - 100.0 fL 89.7 89.5  MCH Latest Ref Range: 26.0 - 34.0 pg 30.3 29.7  MCHC Latest Ref Range: 32.0 - 36.0 g/dL 33.8 33.2  RDW Latest Ref Range: 11.5 - 14.5 % 13.9 14.0  Platelets Latest Ref Range: 150 - 440 K/uL 158 135 (L)  Neutrophils Latest Units: %  76  Lymphocytes Latest Units: %  15  Monocytes Relative Latest Units: %  8  Eosinophil Latest Units: %  1  Basophil Latest Units: %  0  NEUT# Latest Ref Range: 1.4 - 6.5 K/uL  5.4  Lymphocyte # Latest Ref Range: 1.0 - 3.6 K/uL  1.1  Monocyte # Latest Ref Range: 0.2 - 0.9 K/uL  0.6  Eosinophils Absolute Latest Ref Range: 0 - 0.7 K/uL  0.0  Basophils Absolute Latest Ref Range: 0 - 0.1 K/uL  0.0  Appearance Latest Ref Range: CLEAR  HAZY (A)   Bacteria, UA Latest Ref Range: NONE SEEN  RARE (A)   Bilirubin Urine Latest Ref Range: NEGATIVE  NEGATIVE   Color, Urine Latest Ref Range: YELLOW  YELLOW (A)   Glucose Latest Ref Range: NEGATIVE mg/dL NEGATIVE   Hgb urine dipstick Latest Ref Range: NEGATIVE  NEGATIVE   Ketones, ur Latest Ref Range: NEGATIVE mg/dL 80 (A)   Leukocytes, UA Latest Ref Range: NEGATIVE  NEGATIVE   Mucous Unknown PRESENT   Nitrite Latest Ref Range: NEGATIVE  NEGATIVE   pH Latest Ref Range: 5.0 - 8.0  7.0   Protein Latest Ref Range: NEGATIVE mg/dL NEGATIVE   RBC / HPF Latest Ref Range: 0 - 5 RBC/hpf 0-5   Specific Gravity, Urine Latest Ref  Range: 1.005 - 1.030  1.018   Squamous Epithelial / LPF Latest Ref Range: NONE SEEN  6-30 (A)   WBC, UA Latest Ref Range: 0 - 5 WBC/hpf 0-5     Procedures: NST  Discharge Condition: good  Disposition: 01-Home or Self Care  Diet: Regular diet  Discharge Activity: Activity as tolerated  Discharge Instructions  Discharge activity:  No Restrictions    Complete by:  As directed    Use pre surgical wipes as instructed tonight.   Discharge diet:  No restrictions    Complete by:  As directed    Nothing by mouth after midnight.   LABOR:  When conractions begin, you should start to time them from the beginning of one contraction to the beginning  of the next.  When contractions are 5 - 10 minutes apart or less and have been regular for at least an hour, you should call your health care provider.    Complete by:  As directed    No sexual activity restrictions    Complete by:  As directed    Notify physician for bleeding from the vagina    Complete by:  As directed    Notify physician for blurring of vision or spots before the eyes    Complete by:  As directed    Notify physician for chills or fever    Complete by:  As directed    Notify physician for fainting spells, "black outs" or loss of consciousness    Complete by:  As directed    Notify physician for increase in vaginal discharge    Complete by:  As directed    Notify physician for leaking of fluid    Complete by:  As directed    Notify physician for pain or burning when urinating    Complete by:  As directed    Notify physician for pelvic pressure (sudden increase)    Complete by:  As directed    Notify physician for severe or continued nausea or vomiting    Complete by:  As directed    Notify physician for sudden gushing of fluid from the vagina (with or without continued leaking)    Complete by:  As directed    Notify physician for sudden, constant, or occasional abdominal pain    Complete by:  As directed    Notify  physician if baby moving less than usual    Complete by:  As directed      Allergies as of 05/31/2016      Reactions   Codeine Anaphylaxis      Medication List    TAKE these medications   acetaminophen 500 MG tablet Commonly known as:  TYLENOL Take 500 mg by mouth 2 (two) times daily as needed for moderate pain or headache.   Breast Pump Misc Dispense one breast pump for patient   Magnesium 250 MG Tabs Take 250 mg by mouth daily.   PRENAVITE PO Take 1 tablet by mouth daily.        Total time spent taking care of this patient: 20 minutes  Signed: Rod Can, CNM  05/31/2016, 9:27 AM

## 2016-06-01 ENCOUNTER — Encounter
Admission: RE | Disposition: A | Discharge status: Home / Self Care | Payer: Self-pay | Source: Ambulatory Visit | Attending: Obstetrics and Gynecology

## 2016-06-01 ENCOUNTER — Inpatient Hospital Stay: Payer: Managed Care, Other (non HMO) | Admitting: Anesthesiology

## 2016-06-01 ENCOUNTER — Inpatient Hospital Stay
Admission: RE | Admit: 2016-06-01 | Discharge: 2016-06-05 | DRG: 765 | Disposition: A | Discharge status: Home / Self Care | Payer: Managed Care, Other (non HMO) | Source: Ambulatory Visit | Attending: Obstetrics and Gynecology | Admitting: Obstetrics and Gynecology

## 2016-06-01 DIAGNOSIS — E039 Hypothyroidism, unspecified: Secondary | ICD-10-CM | POA: Diagnosis present

## 2016-06-01 DIAGNOSIS — K219 Gastro-esophageal reflux disease without esophagitis: Secondary | ICD-10-CM | POA: Diagnosis present

## 2016-06-01 DIAGNOSIS — E669 Obesity, unspecified: Secondary | ICD-10-CM | POA: Diagnosis present

## 2016-06-01 DIAGNOSIS — O9962 Diseases of the digestive system complicating childbirth: Secondary | ICD-10-CM | POA: Diagnosis present

## 2016-06-01 DIAGNOSIS — O99344 Other mental disorders complicating childbirth: Secondary | ICD-10-CM | POA: Diagnosis present

## 2016-06-01 DIAGNOSIS — O99214 Obesity complicating childbirth: Secondary | ICD-10-CM | POA: Diagnosis present

## 2016-06-01 DIAGNOSIS — F419 Anxiety disorder, unspecified: Secondary | ICD-10-CM | POA: Diagnosis present

## 2016-06-01 DIAGNOSIS — Z6837 Body mass index (BMI) 37.0-37.9, adult: Secondary | ICD-10-CM

## 2016-06-01 DIAGNOSIS — O34211 Maternal care for low transverse scar from previous cesarean delivery: Secondary | ICD-10-CM | POA: Diagnosis present

## 2016-06-01 DIAGNOSIS — D62 Acute posthemorrhagic anemia: Secondary | ICD-10-CM | POA: Diagnosis not present

## 2016-06-01 DIAGNOSIS — Z6841 Body Mass Index (BMI) 40.0 and over, adult: Secondary | ICD-10-CM

## 2016-06-01 DIAGNOSIS — O99284 Endocrine, nutritional and metabolic diseases complicating childbirth: Secondary | ICD-10-CM | POA: Diagnosis present

## 2016-06-01 DIAGNOSIS — O9081 Anemia of the puerperium: Secondary | ICD-10-CM | POA: Diagnosis not present

## 2016-06-01 DIAGNOSIS — Z3A37 37 weeks gestation of pregnancy: Secondary | ICD-10-CM

## 2016-06-01 DIAGNOSIS — Z98891 History of uterine scar from previous surgery: Secondary | ICD-10-CM

## 2016-06-01 LAB — CBC
HEMATOCRIT: 35.6 % (ref 35.0–47.0)
Hemoglobin: 11.7 g/dL — ABNORMAL LOW (ref 12.0–16.0)
MCH: 29.5 pg (ref 26.0–34.0)
MCHC: 32.9 g/dL (ref 32.0–36.0)
MCV: 89.8 fL (ref 80.0–100.0)
Platelets: 145 10*3/uL — ABNORMAL LOW (ref 150–440)
RBC: 3.96 MIL/uL (ref 3.80–5.20)
RDW: 14.2 % (ref 11.5–14.5)
WBC: 7.2 10*3/uL (ref 3.6–11.0)

## 2016-06-01 LAB — TYPE AND SCREEN
ABO/RH(D): A POS
ABO/RH(D): A POS
ANTIBODY SCREEN: NEGATIVE
Antibody Screen: NEGATIVE

## 2016-06-01 LAB — RPR
RPR: NONREACTIVE
RPR: NONREACTIVE

## 2016-06-01 SURGERY — Surgical Case
Anesthesia: Spinal

## 2016-06-01 MED ORDER — NALOXONE HCL 2 MG/2ML IJ SOSY
1.0000 ug/kg/h | PREFILLED_SYRINGE | INTRAVENOUS | Status: DC | PRN
  Filled 2016-06-01: qty 2

## 2016-06-01 MED ORDER — NALBUPHINE HCL 10 MG/ML IJ SOLN: 5.0000 mg | INTRAMUSCULAR | Status: DC | PRN

## 2016-06-01 MED ORDER — MORPHINE SULFATE (PF) 0.5 MG/ML IJ SOLN
INTRAMUSCULAR | Status: DC | PRN
  Administered 2016-06-01: .1 mg via EPIDURAL

## 2016-06-01 MED ORDER — DIPHENHYDRAMINE HCL 50 MG/ML IJ SOLN: 12.5000 mg | INTRAMUSCULAR | Status: DC | PRN

## 2016-06-01 MED ORDER — FENTANYL CITRATE (PF) 100 MCG/2ML IJ SOLN
INTRAMUSCULAR | Status: AC
  Filled 2016-06-01: qty 2

## 2016-06-01 MED ORDER — NALOXONE HCL 0.4 MG/ML IJ SOLN: 0.4000 mg | INTRAMUSCULAR | Status: DC | PRN

## 2016-06-01 MED ORDER — BUPIVACAINE HCL (PF) 0.5 % IJ SOLN
INTRAMUSCULAR | Status: DC | PRN
  Administered 2016-06-01: 10 mL

## 2016-06-01 MED ORDER — IBUPROFEN 600 MG PO TABS
600.0000 mg | ORAL_TABLET | Freq: Four times a day (QID) | ORAL | Status: DC
  Administered 2016-06-01 – 2016-06-02 (×4): 600 mg via ORAL
  Filled 2016-06-01 (×3): qty 1

## 2016-06-01 MED ORDER — SODIUM CHLORIDE 0.9% FLUSH: 3.0000 mL | INTRAVENOUS | Status: DC | PRN

## 2016-06-01 MED ORDER — OXYTOCIN 40 UNITS IN LACTATED RINGERS INFUSION - SIMPLE MED
INTRAVENOUS | Status: AC
  Filled 2016-06-01: qty 1000

## 2016-06-01 MED ORDER — CEFAZOLIN SODIUM-DEXTROSE 2-4 GM/100ML-% IV SOLN: 2.0000 g | INTRAVENOUS | Status: DC

## 2016-06-01 MED ORDER — BUPIVACAINE HCL (PF) 0.5 % IJ SOLN
INTRAMUSCULAR | Status: AC
  Filled 2016-06-01: qty 30

## 2016-06-01 MED ORDER — BUPIVACAINE HCL 0.5 % IJ SOLN
50.0000 mL | Freq: Once | INTRAMUSCULAR | Status: DC
  Filled 2016-06-01: qty 50

## 2016-06-01 MED ORDER — MORPHINE SULFATE (PF) 0.5 MG/ML IJ SOLN
INTRAMUSCULAR | Status: AC
  Filled 2016-06-01: qty 10

## 2016-06-01 MED ORDER — MEPERIDINE HCL 25 MG/ML IJ SOLN: 6.2500 mg | INTRAMUSCULAR | Status: DC | PRN

## 2016-06-01 MED ORDER — SCOPOLAMINE 1 MG/3DAYS TD PT72: 1.0000 | MEDICATED_PATCH | Freq: Once | TRANSDERMAL | Status: DC

## 2016-06-01 MED ORDER — LIDOCAINE HCL (PF) 2 % IJ SOLN
INTRAMUSCULAR | Status: AC
  Filled 2016-06-01: qty 2

## 2016-06-01 MED ORDER — SOD CITRATE-CITRIC ACID 500-334 MG/5ML PO SOLN
30.0000 mL | ORAL | Status: AC
  Administered 2016-06-01: 30 mL via ORAL
  Filled 2016-06-01: qty 15

## 2016-06-01 MED ORDER — SIMETHICONE 80 MG PO CHEW: 80.0000 mg | CHEWABLE_TABLET | ORAL | Status: DC

## 2016-06-01 MED ORDER — COCONUT OIL OIL: 1.0000 "application " | TOPICAL_OIL | Status: DC | PRN

## 2016-06-01 MED ORDER — OXYTOCIN 10 UNIT/ML IJ SOLN
INTRAMUSCULAR | Status: DC | PRN
  Administered 2016-06-01: 40 [IU] via INTRAMUSCULAR

## 2016-06-01 MED ORDER — MIDAZOLAM HCL 2 MG/2ML IJ SOLN
INTRAMUSCULAR | Status: AC
  Filled 2016-06-01: qty 2

## 2016-06-01 MED ORDER — FENTANYL CITRATE (PF) 100 MCG/2ML IJ SOLN: 25.0000 ug | INTRAMUSCULAR | Status: DC | PRN

## 2016-06-01 MED ORDER — MENTHOL 3 MG MT LOZG
1.0000 | LOZENGE | OROMUCOSAL | Status: DC | PRN
  Filled 2016-06-01: qty 9

## 2016-06-01 MED ORDER — LACTATED RINGERS IV SOLN
INTRAVENOUS | Status: DC
  Administered 2016-06-01 (×2): via INTRAVENOUS

## 2016-06-01 MED ORDER — ONDANSETRON HCL 4 MG/2ML IJ SOLN
INTRAMUSCULAR | Status: DC | PRN
  Administered 2016-06-01: 4 mg via INTRAVENOUS

## 2016-06-01 MED ORDER — NALBUPHINE HCL 10 MG/ML IJ SOLN: 5.0000 mg | Freq: Once | INTRAMUSCULAR | Status: DC | PRN

## 2016-06-01 MED ORDER — ACETAMINOPHEN 325 MG PO TABS
650.0000 mg | ORAL_TABLET | ORAL | Status: DC | PRN
  Administered 2016-06-02: 650 mg via ORAL
  Filled 2016-06-01: qty 2

## 2016-06-01 MED ORDER — ONDANSETRON HCL 4 MG/2ML IJ SOLN: 4.0000 mg | Freq: Once | INTRAMUSCULAR | Status: DC | PRN

## 2016-06-01 MED ORDER — DEXTROSE 5 % IV SOLN
2.0000 g | INTRAVENOUS | Status: DC
  Filled 2016-06-01: qty 2000

## 2016-06-01 MED ORDER — OXYCODONE-ACETAMINOPHEN 5-325 MG PO TABS: 2.0000 | ORAL_TABLET | ORAL | Status: DC | PRN

## 2016-06-01 MED ORDER — DIBUCAINE 1 % RE OINT: 1.0000 "application " | TOPICAL_OINTMENT | RECTAL | Status: DC | PRN

## 2016-06-01 MED ORDER — DIPHENHYDRAMINE HCL 25 MG PO CAPS: 25.0000 mg | ORAL_CAPSULE | Freq: Four times a day (QID) | ORAL | Status: DC | PRN

## 2016-06-01 MED ORDER — SIMETHICONE 80 MG PO CHEW: 80.0000 mg | CHEWABLE_TABLET | ORAL | Status: DC | PRN

## 2016-06-01 MED ORDER — WITCH HAZEL-GLYCERIN EX PADS: 1.0000 "application " | MEDICATED_PAD | CUTANEOUS | Status: DC | PRN

## 2016-06-01 MED ORDER — SENNOSIDES-DOCUSATE SODIUM 8.6-50 MG PO TABS
2.0000 | ORAL_TABLET | ORAL | Status: DC
  Administered 2016-06-02 – 2016-06-05 (×4): 2 via ORAL
  Filled 2016-06-01 (×4): qty 2

## 2016-06-01 MED ORDER — LACTATED RINGERS IV SOLN
INTRAVENOUS | Status: DC
  Administered 2016-06-01: 06:00:00 via INTRAVENOUS

## 2016-06-01 MED ORDER — DIPHENHYDRAMINE HCL 25 MG PO CAPS: 25.0000 mg | ORAL_CAPSULE | ORAL | Status: DC | PRN

## 2016-06-01 MED ORDER — PRENATAL MULTIVITAMIN CH
1.0000 | ORAL_TABLET | Freq: Every day | ORAL | Status: DC
  Filled 2016-06-01 (×4): qty 1

## 2016-06-01 MED ORDER — BUPIVACAINE HCL (PF) 0.75 % IJ SOLN
INTRAMUSCULAR | Status: DC | PRN
  Administered 2016-06-01: 1.6 mL

## 2016-06-01 MED ORDER — BUPIVACAINE 0.25 % ON-Q PUMP DUAL CATH 400 ML
400.0000 mL | INJECTION | Status: DC
  Filled 2016-06-01: qty 400

## 2016-06-01 MED ORDER — MORPHINE SULFATE (PF) 10 MG/ML IV SOLN: INTRAVENOUS | Status: DC | PRN

## 2016-06-01 MED ORDER — EPHEDRINE SULFATE 50 MG/ML IJ SOLN
INTRAMUSCULAR | Status: DC | PRN
  Administered 2016-06-01 (×2): 10 mg via INTRAVENOUS

## 2016-06-01 MED ORDER — OXYCODONE-ACETAMINOPHEN 5-325 MG PO TABS: 1.0000 | ORAL_TABLET | ORAL | Status: DC | PRN

## 2016-06-01 MED ORDER — ONDANSETRON HCL 4 MG/2ML IJ SOLN: 4.0000 mg | Freq: Three times a day (TID) | INTRAMUSCULAR | Status: DC | PRN

## 2016-06-01 MED ORDER — PHENYLEPHRINE HCL 10 MG/ML IJ SOLN
INTRAMUSCULAR | Status: DC | PRN
  Administered 2016-06-01: 200 ug via INTRAVENOUS
  Administered 2016-06-01 (×3): 100 ug via INTRAVENOUS
  Administered 2016-06-01 (×2): 200 ug via INTRAVENOUS
  Administered 2016-06-01 (×3): 100 ug via INTRAVENOUS

## 2016-06-01 MED ORDER — LACTATED RINGERS IV SOLN
INTRAVENOUS | Status: DC
  Administered 2016-06-02: via INTRAVENOUS

## 2016-06-01 MED ORDER — KETOROLAC TROMETHAMINE 30 MG/ML IJ SOLN
INTRAMUSCULAR | Status: DC | PRN
  Administered 2016-06-01: 30 mg via INTRAVENOUS

## 2016-06-01 MED ORDER — SIMETHICONE 80 MG PO CHEW
80.0000 mg | CHEWABLE_TABLET | Freq: Three times a day (TID) | ORAL | Status: DC
  Administered 2016-06-01 – 2016-06-03 (×5): 80 mg via ORAL
  Filled 2016-06-01 (×8): qty 1

## 2016-06-01 MED ORDER — IBUPROFEN 600 MG PO TABS
600.0000 mg | ORAL_TABLET | Freq: Four times a day (QID) | ORAL | Status: DC | PRN
  Administered 2016-06-03 – 2016-06-04 (×2): 600 mg via ORAL
  Filled 2016-06-01 (×5): qty 1

## 2016-06-01 MED ORDER — TETANUS-DIPHTH-ACELL PERTUSSIS 5-2.5-18.5 LF-MCG/0.5 IM SUSP: 0.5000 mL | Freq: Once | INTRAMUSCULAR | Status: DC

## 2016-06-01 MED ORDER — ONDANSETRON HCL 4 MG/2ML IJ SOLN
INTRAMUSCULAR | Status: AC
  Filled 2016-06-01: qty 2

## 2016-06-01 MED ORDER — PROPOFOL 10 MG/ML IV BOLUS
INTRAVENOUS | Status: AC
  Filled 2016-06-01: qty 20

## 2016-06-01 MED ORDER — OXYTOCIN 40 UNITS IN LACTATED RINGERS INFUSION - SIMPLE MED
2.5000 [IU]/h | INTRAVENOUS | Status: DC
  Administered 2016-06-01: 2.5 [IU]/h via INTRAVENOUS

## 2016-06-01 SURGICAL SUPPLY — 27 items
BAG COUNTER SPONGE EZ (MISCELLANEOUS) ×2 IMPLANT
CANISTER SUCT 3000ML PPV (MISCELLANEOUS) ×2 IMPLANT
CATH KIT ON-Q SILVERSOAK 5IN (CATHETERS) ×4 IMPLANT
CHLORAPREP W/TINT 26ML (MISCELLANEOUS) ×4 IMPLANT
DERMABOND ADVANCED (GAUZE/BANDAGES/DRESSINGS) ×1
DERMABOND ADVANCED .7 DNX12 (GAUZE/BANDAGES/DRESSINGS) ×1 IMPLANT
DRSG OPSITE POSTOP 4X10 (GAUZE/BANDAGES/DRESSINGS) ×2 IMPLANT
DRSG TELFA 3X8 NADH (GAUZE/BANDAGES/DRESSINGS) ×2 IMPLANT
ELECT CAUTERY BLADE 6.4 (BLADE) ×2 IMPLANT
ELECT REM PT RETURN 9FT ADLT (ELECTROSURGICAL) ×2
ELECTRODE REM PT RTRN 9FT ADLT (ELECTROSURGICAL) ×1 IMPLANT
GAUZE SPONGE 4X4 12PLY STRL (GAUZE/BANDAGES/DRESSINGS) ×2 IMPLANT
GLOVE BIO SURGEON STRL SZ7 (GLOVE) ×2 IMPLANT
GLOVE INDICATOR 7.5 STRL GRN (GLOVE) ×2 IMPLANT
GOWN STRL REUS W/ TWL LRG LVL3 (GOWN DISPOSABLE) ×3 IMPLANT
GOWN STRL REUS W/TWL LRG LVL3 (GOWN DISPOSABLE) ×3
NS IRRIG 1000ML POUR BTL (IV SOLUTION) ×2 IMPLANT
PACK C SECTION AR (MISCELLANEOUS) ×2 IMPLANT
PAD OB MATERNITY 4.3X12.25 (PERSONAL CARE ITEMS) ×2 IMPLANT
PAD PREP 24X41 OB/GYN DISP (PERSONAL CARE ITEMS) ×2 IMPLANT
STRIP CLOSURE SKIN 1/2X4 (GAUZE/BANDAGES/DRESSINGS) ×2 IMPLANT
SUT MNCRL AB 4-0 PS2 18 (SUTURE) ×2 IMPLANT
SUT PDS AB 1 TP1 96 (SUTURE) ×2 IMPLANT
SUT PLAIN 2 0 XLH (SUTURE) ×2 IMPLANT
SUT VIC AB 0 CTX 36 (SUTURE) ×3
SUT VIC AB 0 CTX36XBRD ANBCTRL (SUTURE) ×3 IMPLANT
SUT VIC AB 2-0 CT1 36 (SUTURE) IMPLANT

## 2016-06-01 NOTE — Anesthesia Preprocedure Evaluation (Signed)
Anesthesia Evaluation  Patient identified by MRN, date of birth, ID band Patient awake    Reviewed: Allergy & Precautions, NPO status , Patient's Chart, lab work & pertinent test results  History of Anesthesia Complications Negative for: history of anesthetic complications  Airway Mallampati: II       Dental   Pulmonary neg pulmonary ROS,           Cardiovascular negative cardio ROS       Neuro/Psych Anxiety negative neurological ROS     GI/Hepatic Neg liver ROS, GERD (with pregnancy)  Medicated,  Endo/Other  Hypothyroidism (low normal, no meds)   Renal/GU negative Renal ROS     Musculoskeletal   Abdominal   Peds  Hematology negative hematology ROS (+)   Anesthesia Other Findings   Reproductive/Obstetrics (+) Pregnancy                             Anesthesia Physical Anesthesia Plan  ASA: II  Anesthesia Plan: Spinal   Post-op Pain Management:    Induction:   Airway Management Planned:   Additional Equipment:   Intra-op Plan:   Post-operative Plan:   Informed Consent: I have reviewed the patients History and Physical, chart, labs and discussed the procedure including the risks, benefits and alternatives for the proposed anesthesia with the patient or authorized representative who has indicated his/her understanding and acceptance.     Plan Discussed with:   Anesthesia Plan Comments:         Anesthesia Quick Evaluation

## 2016-06-01 NOTE — H&P (Signed)
Date of Initial H&P: 05/28/2016  History reviewed, patient examined, no change in status, stable for surgery.

## 2016-06-01 NOTE — Discharge Summary (Signed)
OB Discharge Summary     Patient Name: Kellie Davis DOB: 1985/12/02 MRN: [id number]  Date of admission: 06/01/2016 Delivering MD: [name]   Date of discharge: 06/05/2016  Admitting diagnosis: HISTORY OF PRIOR UTERINE SURGERY Intrauterine pregnancy: [redacted]w[redacted]d     Secondary diagnosis:  none Additional problems: none     Discharge diagnosis: Term Pregnancy Delivered                                                                                                Post partum procedures:none  Augmentation: N/A  Complications: None  Hospital course:  Sceduled C/S   31 y.o. yo T7G0174 at [redacted]w[redacted]d was admitted to the hospital 06/01/2016 for scheduled cesarean section with the following indication:primary for history of prior uterine surgery.  Membrane Rupture Time/Date:   ,    Patient delivered a Viable infant.06/01/2016  Details of operation can be found in separate operative note.  Pateint had an uncomplicated postpartum course.  She is ambulating, tolerating a regular diet, passing flatus, and urinating well. Patient is discharged home in stable condition on  06/05/16         Physical exam  Vitals:   06/04/16 1931 06/05/16 0048 06/05/16 0416 06/05/16 0730  BP: 121/75   120/65  Pulse: 81   67  Resp: 20   18  Temp: 98.6 F (37 C) 98.6 F (37 C) 97.9 F (36.6 C) 98.2 F (36.8 C)  TempSrc: Oral Oral Oral Oral  SpO2:    99%  Weight:      Height:       General: alert Lochia: appropriate Uterine Fundus: firm Incision: Healing well with no significant drainage DVT Evaluation: No evidence of DVT seen on physical exam. Labs: Lab Results  Component Value Date   WBC 9.5 06/02/2016   HGB 11.3 (L) 06/02/2016   HCT 33.3 (L) 06/02/2016   MCV 90.9 06/02/2016   PLT 138 (L) 06/02/2016   CMP Latest Ref Rng & Units 05/30/2016  Glucose 65 - 99 mg/dL 79  BUN 6 - 20 mg/dL 6  Creatinine 0.44 - 1.00 mg/dL 0.53  Sodium 135 - 145 mmol/L 138  Potassium 3.5 - 5.1 mmol/L 3.8  Chloride 101  - 111 mmol/L 103  CO2 22 - 32 mmol/L 23  Calcium 8.9 - 10.3 mg/dL 9.1  Total Protein 6.5 - 8.1 g/dL 7.0  Total Bilirubin 0.3 - 1.2 mg/dL 1.3(H)  Alkaline Phos 38 - 126 U/L 112  AST 15 - 41 U/L 27  ALT 14 - 54 U/L 13(L)    Discharge instruction: per After Visit Summary and "Baby and Me Booklet".  After visit meds:  Allergies as of 06/05/2016      Reactions   Codeine Anaphylaxis      Medication List    STOP taking these medications   acetaminophen 500 MG tablet Commonly known as:  TYLENOL   Breast Pump Misc     TAKE these medications   ibuprofen 600 MG tablet Commonly known as:  ADVIL,MOTRIN Take 1 tablet (600 mg total) by mouth every 6 (six) hours as needed  for mild pain.   Magnesium 250 MG Tabs Take 250 mg by mouth daily.   norethindrone 0.35 MG tablet Commonly known as:  MICRONOR,CAMILA,ERRIN Take 1 tablet (0.35 mg total) by mouth daily.   oxyCODONE-acetaminophen 5-325 MG tablet Commonly known as:  PERCOCET/ROXICET Take 1 tablet by mouth every 4 (four) hours as needed (pain scale 4-7).   PRENAVITE PO Take 1 tablet by mouth daily.       Diet: routine diet  Activity: Advance as tolerated. Pelvic rest for 6 weeks.   Outpatient follow up:1 week Follow up Appt:No future appointments. Follow up Visit:No Follow-up on file.  Postpartum contraception: Progesterone only pills  Newborn Data: Live born female  Birth Weight: 7 lb 6 oz (3345 g) APGAR: 8, 9  Baby Feeding: Breast Disposition:home with mother   06/05/2016 [name], MD

## 2016-06-01 NOTE — Op Note (Signed)
Preoperative Diagnosis: 1) 31 y.o. [id number] at [redacted]w[redacted]d 2) History of cornual resection  Postoperative Diagnosis: 1) 31 y.o. 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  Delivery resulted in the birth of a liveborn female, APGAR (1 MIN): 8  APGAR (5 MINS): 9    Patient Condition: stable  Procedure in Detail:  Patient was taken to the operating room were she was administered regional anesthesia.  She was positioned in the supine position, prepped and draped in the  Usual sterile fashion.  Prior to proceeding with the case a time out was performed and the level of anesthetic was checked and noted to be adequate.  Utilizing the scalpel a pfannenstiel skin incision was made 2cm above the pubic symphysis and carried down sharply to the the level of the rectus fascia.  The fascia was incised in the midline using the scalpel and then extended using mayo scissors.  The superior border of the rectus fascia was grasped with two Kocher clamps and the underlying rectus muscles were dissected of the fascia using blunt dissection.  The median raphae was incised using Mayo scissors.   The inferior border of the rectus fascia was dissected of the rectus muscles in a similar fashion.  The midline was identified, the peritoneum was entered bluntly and expanded using manual tractions.  The uterus was noted to be in a none rotated position.  Next the bladder blade  was placed retracting the bladder caudally.  A bladder flap was not created.  A low transverse incision was scored on the lower uterine segment.  The hysterotomy was entered bluntly using the operators finger.  The hysterotomy incision was extended using manual traction.  The operators hand was placed within the hysterotomy position noting the fetus to be within the OA position.  The vertex was grasped, flexed, brought to the incision, and delivered a traumatically using fundal pressure.  The remainder of the body delivered with ease.  The infant was suctioned, cord was clamped and cut before handing off to the awaiting neonatologist.  The placenta was delivered using manual extraction.  The uterus was exteriorized, wiped clean of clots and debris using two moist laps.  The hysterotomy was closed using a two layer closure of 0 Vicryl, with the first being a running locked, the second a vertical imbricating.  The uterus was returned to the abdomen.  The peritoneal gutters were wiped clean of clots and debris using two moist laps.  The hysterotomy incision was re-inspected noted to be hemostatic.  The rectus muscles were inspected noted to be hemostatic.  The superior border of the rectus fascia was grasped with a Kocher clamp.  The ON-Q trocars were then placed 4cm above the superior border of the incision and tunneled subfascially.  The introducers were removed and the catheters were threaded through the sleeves after which the sleeves were removed.  The fascia was closed using a looped #1 PDS in a running fashion taking 1cm by 1cm bites.  The subcutaneous tissue was irrigated using warm  saline, hemostasis achieved using the bovie.  The subcutaneous dead space was greater than 3cm and was closed.  The subcutaneous dead space was obliterated by using a 53-T 0 Chromic in a running fashion.   The skin was closed using insorb staples.  Sponge needle and instrument counts were corrects times two.  The patient tolerated  the procedure well and was taken to the recovery room in stable condition.

## 2016-06-01 NOTE — Anesthesia Post-op Follow-up Note (Cosign Needed)
Anesthesia QCDR form completed.        

## 2016-06-01 NOTE — Consult Note (Signed)
Neonatology Note:   Attendance at C-section:    I was asked by Dr. Georgianne Fick to attend this repeat C/S at term. The mother is a 31 y.o. G3P1011, GBS negative with good prenatal care.  Pregnancy complicated by hypothyroidism and anxiety, both for which not on meds, and obesity.   ROM at delivery, fluid clear. Infant vigorous with good spontaneous cry and tone. Needed only minimal bulb suctioning. Ap 8/9. Lungs clear to ausc in DR. To CN to care of Pediatrician.  Monia Sabal Katherina Mires, MD

## 2016-06-01 NOTE — Anesthesia Procedure Notes (Signed)
Spinal  Patient location during procedure: OR Start time: 06/01/2016 7:45 AM End time: 06/01/2016 7:57 AM Staffing Performed: anesthesiologist  Preanesthetic Checklist Completed: patient identified, site marked, surgical consent, pre-op evaluation, timeout performed, IV checked, risks and benefits discussed and monitors and equipment checked Spinal Block Patient position: sitting Prep: Betadine Patient monitoring: heart rate, continuous pulse ox, blood pressure and cardiac monitor Approach: midline Location: L4-5 Injection technique: single-shot Needle Needle type: Whitacre and Introducer  Needle gauge: 24 G Needle length: 9 cm Additional Notes Negative paresthesia. Negative blood return. Positive free-flowing CSF. Expiration date of kit checked and confirmed. Patient tolerated procedure well, without complications.

## 2016-06-01 NOTE — Anesthesia Procedure Notes (Signed)
Date/Time: 06/01/2016 7:50 AM Performed by: Allean Found Pre-anesthesia Checklist: Patient identified, Emergency Drugs available, Suction available, Patient being monitored and Timeout performed Patient Re-evaluated:Patient Re-evaluated prior to inductionOxygen Delivery Method: Nasal cannula Placement Confirmation: positive ETCO2

## 2016-06-01 NOTE — Transfer of Care (Signed)
Immediate Anesthesia Transfer of Care Note  Patient: Nelva Bush  Procedure(s) Performed: Procedure(s): PRIMARY LOW TRANSVERSE CESAREAN SECTION (N/A)  Patient Location: PACU  Anesthesia Type:Spinal  Level of Consciousness: awake  Airway & Oxygen Therapy: Patient Spontanous Breathing and Patient connected to nasal cannula oxygen  Post-op Assessment: Report given to RN and Post -op Vital signs reviewed and stable  Post vital signs: Reviewed and stable  Last Vitals:  Vitals:   06/01/16 0550 06/01/16 0721  BP: 114/71 110/72  Pulse: (!) 101 96  Resp: 20 16  Temp: 36.7 C 36.8 C    Last Pain:  Vitals:   06/01/16 0721  TempSrc: Oral  PainSc: 0-No pain         Complications: No apparent anesthesia complications

## 2016-06-02 ENCOUNTER — Encounter: Payer: Self-pay | Admitting: Obstetrics and Gynecology

## 2016-06-02 LAB — CBC
HEMATOCRIT: 33.3 % — AB (ref 35.0–47.0)
HEMOGLOBIN: 11.3 g/dL — AB (ref 12.0–16.0)
MCH: 30.8 pg (ref 26.0–34.0)
MCHC: 33.9 g/dL (ref 32.0–36.0)
MCV: 90.9 fL (ref 80.0–100.0)
Platelets: 138 10*3/uL — ABNORMAL LOW (ref 150–440)
RBC: 3.66 MIL/uL — AB (ref 3.80–5.20)
RDW: 14.5 % (ref 11.5–14.5)
WBC: 9.5 10*3/uL (ref 3.6–11.0)

## 2016-06-02 NOTE — Lactation Note (Signed)
This note was copied from a baby's chart. Lactation Consultation Note  Patient Name: Kellie Davis TKKOE'C Date: 06/02/2016 Reason for consult: Follow-up assessment This one day old 13 week gest. Age baby has been nursing well per mom's account. Baby is at 8% weight loss, but Mom says that the pediatrician is wondering if first birth weight was correct". I also had same thought as it doesn't seem like a plausible weight loss at 12 hours of age, especially since baby has been feeding so often, moist mucous membranes,  and having many voids and stools. Regardless, I assessed feeding to ensure quality feeds. I did need to help Mom re-position so baby could suck swallow better. She later demonstrated it well. Mom does have large nipples, but baby seems to comfortably latch beyond nipple and correctly onto areola. Audible swallows with little effort. No visible trauma to Mom's nipples/breasts. Mom denies needing any other education now (after I reviewed how to tell baby is well nourished).   Maternal Data    Feeding Feeding Type: Breast Fed  LATCH Score/Interventions Latch: Grasps breast easily, tongue down, lips flanged, rhythmical sucking.  Audible Swallowing: Spontaneous and intermittent  Type of Nipple: Everted at rest and after stimulation  Comfort (Breast/Nipple): Soft / non-tender     Hold (Positioning): Assistance needed to correctly position infant at breast and maintain latch. Intervention(s): Breastfeeding basics reviewed;Support Pillows;Position options  LATCH Score: 9  Lactation Tools Discussed/Used     Consult Status Consult Status: Follow-up Date: 06/03/16 Follow-up type: In-patient    Roque Cash 06/02/2016, 12:24 PM

## 2016-06-02 NOTE — Anesthesia Postprocedure Evaluation (Signed)
Anesthesia Post Note  Patient: Nelva Bush  Procedure(s) Performed: Procedure(s) (LRB): PRIMARY LOW TRANSVERSE CESAREAN SECTION (N/A)  Patient location during evaluation: Mother Baby Anesthesia Type: Spinal Level of consciousness: awake, awake and alert and oriented Pain management: pain level controlled Vital Signs Assessment: post-procedure vital signs reviewed and stable Respiratory status: spontaneous breathing, nonlabored ventilation and respiratory function stable Cardiovascular status: stable Postop Assessment: no headache, no backache and epidural receding Anesthetic complications: no     Last Vitals:  Vitals:   06/02/16 0001 06/02/16 0418  BP: 99/61 (!) 94/49  Pulse: 80 72  Resp: 18 18  Temp: 36.9 C 36.7 C    Last Pain:  Vitals:   06/02/16 0418  TempSrc: Oral  PainSc:                  Ricki Towe

## 2016-06-02 NOTE — Progress Notes (Signed)
  Subjective:   Doing well no concerns, minimal lochia.  Tolerating po.  Pain well controlled  Objective:  Blood pressure (!) 94/49, pulse 72, temperature 98 F (36.7 C), temperature source Oral, resp. rate 18, height 5' 3.5" (1.613 m), weight 230 lb (104.3 kg), SpO2 100 %, unknown if currently breastfeeding.  General: NAD Pulmonary: no increased work of breathing Abdomen: non-distended, non-tender, fundus firm at level of umbilicus Incision: D/C/I area of bleeding marked on right aspect of incision has not increased beyond marked boundaries Extremities: no edema, no erythema, no tenderness  Results for orders placed or performed during the hospital encounter of 06/01/16 (from the past 72 hour(s))  Type and screen [place]     Status: None   Collection Time: 06/01/16  6:01 AM  Result Value Ref Range   ABO/RH(D) A POS    Antibody Screen NEG    Sample Expiration 06/04/2016   CBC     Status: Abnormal   Collection Time: 06/01/16  6:01 AM  Result Value Ref Range   WBC 7.2 3.6 - 11.0 K/uL   RBC 3.96 3.80 - 5.20 MIL/uL   Hemoglobin 11.7 (L) 12.0 - 16.0 g/dL   HCT 35.6 35.0 - 47.0 %   MCV 89.8 80.0 - 100.0 fL   MCH 29.5 26.0 - 34.0 pg   MCHC 32.9 32.0 - 36.0 g/dL   RDW 14.2 11.5 - 14.5 %   Platelets 145 (L) 150 - 440 K/uL     Assessment:   31 y.o. [id number] postoperativeday # 1 1LTCS   Plan:  1) Acute blood loss anemia - hemodynamically stable and asymptomatic - po ferrous sulfate  2) Blood Type --/--/A POS (05/01 0601) / Rubella Immune (10/05 0000) / Varicella Immune  3) TDAP status  UTD  4) Breast/mini pill  5) Disposition anticipate discharge POD4

## 2016-06-03 NOTE — Progress Notes (Signed)
Patient ID: Nelva Bush, female   DOB: 01-Dec-1985, 31 y.o.   MRN: [id number]  Obstetric Postpartum/PostOperative Daily Progress Note Subjective:  31 y.o. [id number] POD#2 status post repeat cesarean delivery.  She is ambulating, is tolerating po, is voiding spontaneously.  Her pain is well controlled on PO pain medications. Her lochia is less than menses.  Reports a little leakage at the OnQ pump site.    Medications SCHEDULED MEDICATIONS  . prenatal multivitamin  1 tablet Oral Q1200  . senna-docusate  2 tablet Oral Q24H  . simethicone  80 mg Oral TID PC  . Tdap  0.5 mL Intramuscular Once    MEDICATION INFUSIONS  . bupivacaine 0.25 % ON-Q pump DUAL CATH 400 mL      PRN MEDICATIONS  acetaminophen, coconut oil, witch hazel-glycerin **AND** dibucaine, diphenhydrAMINE, ibuprofen, menthol-cetylpyridinium, oxyCODONE-acetaminophen, oxyCODONE-acetaminophen, simethicone    Objective:   Vitals:   06/02/16 1706 06/02/16 1851 06/02/16 2345 06/03/16 0300  BP: 108/70 (!) 115/59    Pulse:  86    Resp:  20    Temp:  98.9 F (37.2 C) 98.1 F (36.7 C) 98.3 F (36.8 C)  TempSrc:  Oral Oral Oral  SpO2:  100%    Weight:      Height:        Current Vital Signs 24h Vital Sign Ranges  T 98.3 F (36.8 C) Temp  Avg: 98.1 F (36.7 C)  Min: 97.4 F (36.3 C)  Max: 98.9 F (37.2 C)  BP (!) 115/59 BP  Min: 103/56  Max: 115/59  HR 86 Pulse  Avg: 83.7  Min: 78  Max: 87  RR 20 Resp  Avg: 18.7  Min: 18  Max: 20  SaO2 100 % Not Delivered SpO2  Avg: 98.7 %  Min: 98 %  Max: 100 %       24 Hour I/O Current Shift I/O  Time Ins Outs 05/02 0701 - 05/03 0700 In: -  Out: 900 [Urine:900] No intake/output data recorded.  General: NAD Pulmonary: no increased work of breathing Abdomen: non-distended, non-tender, fundus firm at level of umbilicus Inc: Clean/dry/intact,  Extremities: no edema, no erythema, no tenderness  Labs:   Recent Labs Lab 05/31/16 0859 06/01/16 0601 06/02/16 0737  WBC 7.1 7.2  9.5  HGB 10.9* 11.7* 11.3*  HCT 32.9* 35.6 33.3*  PLT 135* 145* 138*     Assessment:   31 y.o. F2T2446 postoperative day # 2, s/p repeat cesarean section  Plan:  1) Acute blood loss anemia - hemodynamically stable and asymptomatic - po ferrous sulfate  2) A POS / Rubella Immune (10/05 0000)/ Varicella Immune  3) TDAP status up to date  4) breast /Contraception = oral progesterone-only contraceptive  5) Disposition: POD# Omega, MD, [place] 06/03/2016 7:49 AM

## 2016-06-04 LAB — URINALYSIS, COMPLETE (UACMP) WITH MICROSCOPIC
Bilirubin Urine: NEGATIVE
GLUCOSE, UA: NEGATIVE mg/dL
KETONES UR: NEGATIVE mg/dL
LEUKOCYTES UA: NEGATIVE
NITRITE: NEGATIVE
PH: 6 (ref 5.0–8.0)
PROTEIN: NEGATIVE mg/dL
Specific Gravity, Urine: 1.009 (ref 1.005–1.030)

## 2016-06-04 MED ORDER — BREAST MILK
ORAL | Status: DC
  Filled 2016-06-04 (×25): qty 1

## 2016-06-04 NOTE — Progress Notes (Signed)
  Subjective:  Doing well no concerns, pain well controlled, minimal lochia  Objective:  Blood pressure 107/68, pulse 68, temperature 97.9 F (36.6 C), temperature source Oral, resp. rate 18, height 5' 3.5" (1.613 m), weight 230 lb (104.3 kg), SpO2 99 %, unknown if currently breastfeeding.  General: NAD Pulmonary: no increased work of breathing Abdomen: non-distended, non-tender, fundus firm at level of umbilicus Incision: D/C/I Extremities: no edema, no erythema, no tenderness  Results for orders placed or performed during the hospital encounter of 06/01/16 (from the past 72 hour(s))  CBC     Status: Abnormal   Collection Time: 06/02/16  7:37 AM  Result Value Ref Range   WBC 9.5 3.6 - 11.0 K/uL   RBC 3.66 (L) 3.80 - 5.20 MIL/uL   Hemoglobin 11.3 (L) 12.0 - 16.0 g/dL   HCT 33.3 (L) 35.0 - 47.0 %   MCV 90.9 80.0 - 100.0 fL   MCH 30.8 26.0 - 34.0 pg   MCHC 33.9 32.0 - 36.0 g/dL   RDW 14.5 11.5 - 14.5 %   Platelets 138 (L) 150 - 440 K/uL     Assessment:   31 y.o. [id number] postoperativeday # 3 1LTCS   Plan:  1) Acute blood loss anemia - hemodynamically stable and asymptomatic - po ferrous sulfate  2) Blood Type --/--/A POS (05/01 0601) / Rubella Immune (10/05 0000) / Varicella Immune  3) TDAP status UTD  4) Breast/mini-pill  5) Disposition anticipated discharge POD4

## 2016-06-05 MED ORDER — NORETHINDRONE 0.35 MG PO TABS: 1.0000 | ORAL_TABLET | Freq: Every day | ORAL | 11 refills | Status: DC

## 2016-06-05 MED ORDER — OXYCODONE-ACETAMINOPHEN 5-325 MG PO TABS: 1.0000 | ORAL_TABLET | ORAL | 0 refills | Status: DC | PRN

## 2016-06-05 MED ORDER — IBUPROFEN 600 MG PO TABS: 600.0000 mg | ORAL_TABLET | Freq: Four times a day (QID) | ORAL | 0 refills | Status: DC | PRN

## 2016-06-05 NOTE — Progress Notes (Signed)
Patient discharged home with infant and spouse. Discharge instructions, prescriptions and follow up appointment given to and reviewed with patient and spouse. Patient verbalized understanding. Escorted out via wheelchair by auxiliary.  

## 2016-06-08 ENCOUNTER — Ambulatory Visit (INDEPENDENT_AMBULATORY_CARE_PROVIDER_SITE_OTHER): Payer: Managed Care, Other (non HMO) | Admitting: Obstetrics and Gynecology

## 2016-06-09 NOTE — Progress Notes (Signed)
      Postoperative Follow-up Patient presents post op from primary cesarean section 1weeks ago for history of prior uterine surgery.  Subjective: Patient reports marked improvement in her preop symptoms. Eating a regular diet without difficulty. Pain is controlled without any medications.  Activity: normal activities of daily living, but still with some limitations.  Objective: Vitals:   06/08/16 1600  BP: 128/68  Pulse: 62     Assessment: 31 y.o. s/p primary cesarean section 1 week ago stable  Plan: Patient has done well after surgery with no apparent complications.  I have discussed the post-operative course to date, and the expected progress moving forward.  The patient understands what complications to be concerned about.  I will see the patient in routine follow up, or sooner if needed.    Activity plan: No heavy lifting.  [name] 06/09/2016, 9:24 AM

## 2016-07-13 ENCOUNTER — Ambulatory Visit: Payer: Managed Care, Other (non HMO) | Admitting: Obstetrics and Gynecology

## 2016-07-28 ENCOUNTER — Ambulatory Visit (INDEPENDENT_AMBULATORY_CARE_PROVIDER_SITE_OTHER): Payer: Managed Care, Other (non HMO) | Admitting: Obstetrics and Gynecology

## 2016-07-28 ENCOUNTER — Encounter: Payer: Self-pay | Admitting: Obstetrics and Gynecology

## 2016-07-28 NOTE — Progress Notes (Addendum)
Postpartum Visit  Chief Complaint:  Chief Complaint  Patient presents with  . Postpartum Care    History of Present Illness: Patient is a 31 y.o. [id number] presents for postpartum visit.  Date of delivery: 06/01/2016 Type of delivery: Cesarean - Vacuum or forceps assisted  not applicable Episiotomy No.  Laceration: no  Pregnancy or labor problems:  no Any problems since the delivery:  no  Newborn Details:  Maternal Details:  Breast Feeding:  yes Post partum depression/anxiety noted:  not applicable Edinburgh Post-Partum Depression Score:  0  Date of last PAP: 08/20/14 NIL HPV negative  Review of Systems: Review of Systems  Constitutional: Negative for chills and fever.  HENT: Negative for congestion.   Respiratory: Negative for cough and shortness of breath.   Cardiovascular: Negative for chest pain and palpitations.  Gastrointestinal: Negative for abdominal pain, constipation, diarrhea, heartburn, nausea and vomiting.  Genitourinary: Negative for dysuria, frequency and urgency.  Skin: Negative for itching and rash.  Neurological: Negative for dizziness and headaches.  Endo/Heme/Allergies: Negative for polydipsia.  Psychiatric/Behavioral: Negative for depression.    Past Medical History:  Past Medical History:  Diagnosis Date  . Anxiety disorder, unspecified    Situational  . Constipation   . Ectopic pregnancy    Right cornual ectopic  . Hypothyroidism   . Obesity (BMI 35.0-39.9 without comorbidity)   . Vaginal Pap smear, abnormal 2014/2015    Past Surgical History:  Past Surgical History:  Procedure Laterality Date  . BREAST ENHANCEMENT SURGERY  2014  . CESAREAN SECTION N/A 06/01/2016   Procedure: PRIMARY LOW TRANSVERSE CESAREAN SECTION;  Surgeon: [name], MD;  Location: [facility] ORS;  Service: Obstetrics;  Laterality: N/A;  . COLPOSCOPY  02/25/2012  . LAPAROSCOPIC SALPINGO OOPHERECTOMY Right 01/06/2015   Procedure: LAPAROSCOPIC right SALPINGECTOMYwith  removal of ectopic and partial  right corneal resection ;  no oophorectomySurgeon: [name], MD;  Location: [facility] ORS;  Service: Gynecology;  Laterality: Right;  . TONSILLECTOMY  2014    Family History:  Family History  Problem Relation Age of Onset  . Heart disease Mother   . Diabetes Mother   . Hyperlipidemia Mother     Social History:  Social History   Social History  . Marital status: Married    Spouse name: N/A  . Number of children: N/A  . Years of education: N/A   Occupational History  . Not on file.   Social History Main Topics  . Smoking status: Never Smoker  . Smokeless tobacco: Never Used  . Alcohol use No     Comment: occas  . Drug use: No  . Sexual activity: Yes    Birth control/ protection: None   Other Topics Concern  . Not on file   Social History Narrative  . No narrative on file    Allergies:  Allergies  Allergen Reactions  . Codeine Anaphylaxis    Medications: Prior to Admission medications   Medication Sig Start Date End Date Taking? Authorizing Provider  ibuprofen (ADVIL,MOTRIN) 600 MG tablet Take 1 tablet (600 mg total) by mouth every 6 (six) hours as needed for mild pain. 06/05/16   [name], MD  norethindrone (MICRONOR,CAMILA,ERRIN) 0.35 MG tablet Take 1 tablet (0.35 mg total) by mouth daily. 06/05/16   [name], MD  Prenatal Vit-Fe Fumarate-FA (PRENAVITE PO) Take 1 tablet by mouth daily.    [provider]    Physical Exam Vitals:  Vitals:   07/28/16 1512  BP: 102/66  Pulse:  68    General: NAD HEENT: normocephalic, anicteric Pulmonary: No increased work of breathing Abdomen: NABS, soft, non-tender, non-distended.  Umbilicus without lesions.  No hepatomegaly, splenomegaly or masses palpable. No evidence of hernia. Incision Well healed Genitourinary:  External: Normal external female genitalia.  Normal urethral meatus, normal  Bartholin's and Skene's glands.    Vagina: Normal vaginal mucosa, no  evidence of prolapse.    Cervix: Grossly normal in appearance, no bleeding  Uterus: Non-enlarged, mobile, normal contour.  No CMT  Adnexa: ovaries non-enlarged, no adnexal masses  Rectal: deferred Extremities: no edema, erythema, or tenderness Neurologic: Grossly intact Psychiatric: mood appropriate, affect full  Assessment: 31 y.o. [id number] presenting for 6 week postpartum visit  Plan: Problem List Items Addressed This Visit    None       1) Contraception Education given regarding options for contraception, including oral progestin only pill.  2)  Pap - ASCCP guidelines and rational discussed.  Patient opts for 3 year screening interval  3) Patient underwent screening for postpartum depression with no concerns noted.  4) Follow up 1 year for routine annual exam

## 2016-08-17 ENCOUNTER — Other Ambulatory Visit: Payer: Self-pay | Admitting: Obstetrics and Gynecology

## 2016-08-17 MED ORDER — NORETHIN-ETH ESTRAD-FE BIPHAS 1 MG-10 MCG / 10 MCG PO TABS: 1.0000 | ORAL_TABLET | Freq: Every day | ORAL | 3 refills | Status: DC

## 2016-08-30 ENCOUNTER — Encounter: Payer: Self-pay | Admitting: Obstetrics and Gynecology

## 2016-08-30 ENCOUNTER — Ambulatory Visit (INDEPENDENT_AMBULATORY_CARE_PROVIDER_SITE_OTHER): Payer: Managed Care, Other (non HMO) | Admitting: Obstetrics and Gynecology

## 2016-08-30 VITALS — BP 110/68 | HR 88 | Ht 64.0 in | Wt 241.0 lb

## 2016-08-30 DIAGNOSIS — E669 Obesity, unspecified: Secondary | ICD-10-CM

## 2016-08-30 DIAGNOSIS — IMO0001 Reserved for inherently not codable concepts without codable children: Secondary | ICD-10-CM

## 2016-08-30 DIAGNOSIS — E039 Hypothyroidism, unspecified: Secondary | ICD-10-CM | POA: Diagnosis not present

## 2016-08-30 DIAGNOSIS — E038 Other specified hypothyroidism: Secondary | ICD-10-CM

## 2016-08-30 DIAGNOSIS — Z6841 Body Mass Index (BMI) 40.0 and over, adult: Secondary | ICD-10-CM

## 2016-08-30 MED ORDER — PHENTERMINE HCL 37.5 MG PO TABS: 37.5000 mg | ORAL_TABLET | Freq: Every day | ORAL | 0 refills | Status: DC

## 2016-08-30 NOTE — Progress Notes (Signed)
Gynecology Office Visit  Chief Complaint:  Chief Complaint  Patient presents with  . Weight Loss    History of Present Illness: Patientis a 31 y.o. [id number] female, who presents for the evaluation of weight gain. She has gained 20 pounds primarily over past year. The patient states the following issues have contributed to her weight problem: recent pregnancy (no longer breast feeding).  The patient has no additional symptoms. The patient specifically denies memory loss, muscle weakness, excessive thirst, and polyuria. Weight related co-morbidities include subclinical hypothyroidism. She has tried phentermine in the past with moderate success.   Review of Systems: 10 point review of systems negative unless otherwise noted in HPI  Past Medical History:  Past Medical History:  Diagnosis Date  . Anxiety disorder, unspecified    Situational  . Constipation   . Ectopic pregnancy    Right cornual ectopic  . Hypothyroidism   . Obesity (BMI 35.0-39.9 without comorbidity)   . Vaginal Pap smear, abnormal 2014/2015    Past Surgical History:  Past Surgical History:  Procedure Laterality Date  . BREAST ENHANCEMENT SURGERY  2014  . CESAREAN SECTION N/A 06/01/2016   Procedure: PRIMARY LOW TRANSVERSE CESAREAN SECTION;  Surgeon: [name], MD;  Location: [facility];  Service: Obstetrics;  Laterality: N/A;  . COLPOSCOPY  02/25/2012  . LAPAROSCOPIC SALPINGO OOPHERECTOMY Right 01/06/2015   Procedure: LAPAROSCOPIC right SALPINGECTOMYwith removal of ectopic and partial  right corneal resection ;  no oophorectomySurgeon: [name], MD;  Location: [facility];  Service: Gynecology;  Laterality: Right;  . TONSILLECTOMY  2014    Gynecologic History: Patient's last menstrual period was 08/08/2016.  Obstetric History: [id number]  Family History:  Family History  Problem Relation Age of Onset  . Heart disease Mother   . Diabetes Mother   . Hyperlipidemia Mother     Social History:    Social History   Social History  . Marital status: Married    Spouse name: N/A  . Number of children: N/A  . Years of education: N/A   Occupational History  . Not on file.   Social History Main Topics  . Smoking status: Never Smoker  . Smokeless tobacco: Never Used  . Alcohol use No     Comment: occas  . Drug use: No  . Sexual activity: Yes    Birth control/ protection: None   Other Topics Concern  . Not on file   Social History Narrative  . No narrative on file    Allergies:  Allergies  Allergen Reactions  . Codeine Anaphylaxis    Medications: Prior to Admission medications   Medication Sig Start Date End Date Taking? Authorizing Provider  ibuprofen (ADVIL,MOTRIN) 600 MG tablet Take 1 tablet (600 mg total) by mouth every 6 (six) hours as needed for mild pain. 06/05/16  Yes [name], MD  Norethindrone-Ethinyl Estradiol-Fe Biphas (LO LOESTRIN FE) 1 MG-10 MCG / 10 MCG tablet Take 1 tablet by mouth daily. 08/17/16 11/09/16 Yes [name], MD    Physical Exam Vitals:  Vitals:   08/30/16 0835  BP: 110/68  Pulse: 88   Patient's last menstrual period was 08/08/2016.  Filed Weights   08/30/16 0835  Weight: 241 lb (109.3 kg)   Body mass index is 41.37 kg/m.   General: NAD HEENT: normocephalic, anicteric Thyroid: no enlargement Pulmonary: no increased work of breathing Neurologic: Grossly intact Psychiatric: mood appropriate, affect full  Assessment: 31 y.o. [id number] No problem-specific Assessment & Plan notes found for this encounter.  Plan: Problem List Items Addressed This Visit    None    Visit Diagnoses    Class 3 obesity without serious comorbidity with body mass index (BMI) of 40.0 to 44.9 in adult, unspecified obesity type ([place])    -  Primary   Relevant Medications   phentermine (ADIPEX-P) 37.5 MG tablet   Other Relevant Orders   Hemoglobin A1c   Thyroid Panel With TSH   Subclinical hypothyroidism       Relevant Orders    Thyroid Panel With TSH      1) 1500 Calorie ADA Diet  2) Patient education given regarding appropriate lifestyle changes for weight loss including: regular physical activity, healthy coping strategies, caloric restriction and healthy eating patterns.  3) Patient will be started on weight loss medication. The risks and benefits and side effects of medication, such as Adipex (Phenteramine) ,  Tenuate (Diethylproprion), Belviq (lorcarsin), Contrave (buproprion/naltrexone), Qsymia (phentermine/topiramate), and Saxenda (liraglutide) is discussed. The pros and cons of suppressing appetite and boosting metabolism is discussed. Risks of tolerence and addiction is discussed for selected agents discussed. Use of medicine will ne short term, such as 3-4 months at a time followed by a period of time off of the medicine to avoid these risks and side effects for Adipex, Qsymia, and Tenuate discussed. Pt to call with any negative side effects and agrees to keep follow up appts.  4) Comorbidity Screening - hypothyroidism screening, diabetes, and hyperlipidemia screening offered  5) Encouraged weekly weight monitorig to track progress and sample 1 week food diary  6) Contraception - discussed that all weight loss drugs fall in to pregnancy category X, patient currently has reliable contraception in the form of OCP  7) 15 minutes face-to-face; counseling/coordination of care > 50 percent of visit  8) Follow up in 4 weeks to assess response

## 2016-08-31 LAB — THYROID PANEL WITH TSH
Free Thyroxine Index: 2.1 (ref 1.2–4.9)
T3 UPTAKE RATIO: 33 % (ref 24–39)
T4 TOTAL: 6.3 ug/dL (ref 4.5–12.0)
TSH: 0.593 u[IU]/mL (ref 0.450–4.500)

## 2016-08-31 LAB — HEMOGLOBIN A1C
Est. average glucose Bld gHb Est-mCnc: 88 mg/dL
HEMOGLOBIN A1C: 4.7 % — AB (ref 4.8–5.6)

## 2016-09-01 ENCOUNTER — Encounter: Payer: Self-pay | Admitting: Obstetrics and Gynecology

## 2016-09-24 ENCOUNTER — Encounter: Payer: Self-pay | Admitting: Obstetrics and Gynecology

## 2016-09-24 ENCOUNTER — Ambulatory Visit (INDEPENDENT_AMBULATORY_CARE_PROVIDER_SITE_OTHER): Payer: Managed Care, Other (non HMO) | Admitting: Obstetrics and Gynecology

## 2016-09-24 VITALS — BP 114/78 | HR 90 | Wt 232.0 lb

## 2016-09-24 DIAGNOSIS — E669 Obesity, unspecified: Secondary | ICD-10-CM | POA: Diagnosis not present

## 2016-09-24 DIAGNOSIS — Z6839 Body mass index (BMI) 39.0-39.9, adult: Secondary | ICD-10-CM | POA: Diagnosis not present

## 2016-09-24 MED ORDER — NORGESTIMATE-ETH ESTRADIOL 0.25-35 MG-MCG PO TABS: 1.0000 | ORAL_TABLET | Freq: Every day | ORAL | 11 refills | Status: DC

## 2016-09-24 MED ORDER — FLUCONAZOLE 150 MG PO TABS: 150.0000 mg | ORAL_TABLET | Freq: Once | ORAL | 3 refills | Status: AC

## 2016-09-24 MED ORDER — PHENTERMINE HCL 37.5 MG PO TABS: 37.5000 mg | ORAL_TABLET | Freq: Every day | ORAL | 0 refills | Status: DC

## 2016-09-24 NOTE — Progress Notes (Signed)
Gynecology Office Visit  Chief Complaint:  Chief Complaint  Patient presents with  . Weight Check  . Menorrhagia    longer and heavy  . Vaginitis    History of Present Illness: Patientis a 31 y.o. [id number] female, who presents for the evaluation of the desire to lose weight. She has lost 9  pounds. The patient states the following symptoms since starting her weight loss therapy: appetite suppression, energy, and weight loss.  The patient also reports no other ill effects. The patient specifically denies heart palpitations, anxiety, and insomnia.    Review of Systems: 10 point review of systems negative unless otherwise noted in HPI  Past Medical History:  Past Medical History:  Diagnosis Date  . Anxiety disorder, unspecified    Situational  . Constipation   . Ectopic pregnancy    Right cornual ectopic  . Hypothyroidism   . Obesity (BMI 35.0-39.9 without comorbidity)   . Vaginal Pap smear, abnormal 2014/2015    Past Surgical History:  Past Surgical History:  Procedure Laterality Date  . BREAST ENHANCEMENT SURGERY  2014  . CESAREAN SECTION N/A 06/01/2016   Procedure: PRIMARY LOW TRANSVERSE CESAREAN SECTION;  Surgeon: [name], MD;  Location: [facility];  Service: Obstetrics;  Laterality: N/A;  . COLPOSCOPY  02/25/2012  . LAPAROSCOPIC SALPINGO OOPHERECTOMY Right 01/06/2015   Procedure: LAPAROSCOPIC right SALPINGECTOMYwith removal of ectopic and partial  right corneal resection ;  no oophorectomySurgeon: [name], MD;  Location: [facility];  Service: Gynecology;  Laterality: Right;  . TONSILLECTOMY  2014    Gynecologic History: Patient's last menstrual period was 09/07/2016.  Obstetric History: [id number]  Family History:  Family History  Problem Relation Age of Onset  . Heart disease Mother   . Diabetes Mother   . Hyperlipidemia Mother     Social History:  Social History   Social History  . Marital status: Married    Spouse name: N/A  . Number of  children: N/A  . Years of education: N/A   Occupational History  . Not on file.   Social History Main Topics  . Smoking status: Never Smoker  . Smokeless tobacco: Never Used  . Alcohol use No     Comment: occas  . Drug use: No  . Sexual activity: Yes    Birth control/ protection: None   Other Topics Concern  . Not on file   Social History Narrative  . No narrative on file    Allergies:  Allergies  Allergen Reactions  . Codeine Anaphylaxis    Medications: Prior to Admission medications   Medication Sig Start Date End Date Taking? Authorizing Provider  ibuprofen (ADVIL,MOTRIN) 600 MG tablet Take 1 tablet (600 mg total) by mouth every 6 (six) hours as needed for mild pain. 06/05/16  Yes [name], MD  Norethindrone-Ethinyl Estradiol-Fe Biphas (LO LOESTRIN FE) 1 MG-10 MCG / 10 MCG tablet Take 1 tablet by mouth daily. 08/17/16 11/09/16 Yes [name], MD  phentermine (ADIPEX-P) 37.5 MG tablet Take 1 tablet (37.5 mg total) by mouth daily before breakfast. 08/30/16  Yes [name], MD    Physical Exam Blood pressure 114/78, pulse 90, weight 232 lb (105.2 kg), last menstrual period 09/07/2016, not currently breastfeeding. Prior weight 241lbs Body mass index is 39.82 kg/m.   General: NAD HEENT: normocephalic, anicteric Thyroid: no enlargement Pulmonary: no increased work of breathing Neurologic: Grossly intact Psychiatric: mood appropriate, affect full  Assessment: 31 y.o. [id number] medical weight loss follow up  Plan: Problem  List Items Addressed This Visit    None    Visit Diagnoses    Class 2 obesity without serious comorbidity with body mass index (BMI) of 39.0 to 39.9 in adult, unspecified obesity type    -  Primary   Relevant Medications   phentermine (ADIPEX-P) 37.5 MG tablet      1) 1500 Calorie ADA Diet  2) Patient education given regarding appropriate lifestyle changes for weight loss including: regular physical activity, healthy coping  strategies, caloric restriction and healthy eating patterns.  3) Patient will be started on weight loss medication. The risks and benefits and side effects of medication, such as Adipex (Phenteramine) ,  Tenuate (Diethylproprion), Belviq (lorcarsin), Contrave (buproprion/naltrexone), Qsymia (phentermine/topiramate), and Saxenda (liraglutide) is discussed. The pros and cons of suppressing appetite and boosting metabolism is discussed. Risks of tolerence and addiction is discussed for selected agents discussed. Use of medicine will ne short term, such as 3-4 months at a time followed by a period of time off of the medicine to avoid these risks and side effects for Adipex, Qsymia, and Tenuate discussed. Pt to call with any negative side effects and agrees to keep follow up appts.  4) Patient to take medication, with the benefits of appetite suppression and metabolism boost d/w pt, along with the side effects and risk factors of long term use that will be avoided with our use of short bursts of therapy. Rx provided.    5) 15 minutes face-to-face; with counseling/coordination of care > 50 percent of visit related to obesity and ongoing management/treatment   6) Follow up in 4 weeks to assess response

## 2016-10-27 ENCOUNTER — Ambulatory Visit (INDEPENDENT_AMBULATORY_CARE_PROVIDER_SITE_OTHER): Payer: Managed Care, Other (non HMO) | Admitting: Obstetrics and Gynecology

## 2016-10-27 ENCOUNTER — Encounter: Payer: Self-pay | Admitting: Obstetrics and Gynecology

## 2016-10-27 VITALS — BP 102/68 | HR 109 | Ht 64.0 in | Wt 228.0 lb

## 2016-10-27 DIAGNOSIS — Z6839 Body mass index (BMI) 39.0-39.9, adult: Secondary | ICD-10-CM

## 2016-10-27 DIAGNOSIS — E669 Obesity, unspecified: Secondary | ICD-10-CM | POA: Diagnosis not present

## 2016-10-27 MED ORDER — PHENTERMINE HCL 37.5 MG PO TABS: 37.5000 mg | ORAL_TABLET | Freq: Every day | ORAL | 0 refills | Status: DC

## 2016-10-27 MED ORDER — DROSPIRENONE-ETHINYL ESTRADIOL 3-0.03 MG PO TABS: 1.0000 | ORAL_TABLET | Freq: Every day | ORAL | 3 refills | Status: DC

## 2016-10-27 NOTE — Progress Notes (Signed)
Gynecology Office Visit  Chief Complaint:  Chief Complaint  Patient presents with  . Weight Check    History of Present Illness: Patientis a 31 y.o. [id number] female, who presents for the evaluation of the desire to lose weight. She has lost 4 pounds for 2 month total of 13lbs. The patient states the following symptoms since starting her weight loss therapy: appetite suppression, energy, and weight loss.  The patient also reports no other ill effects. The patient specifically denies heart palpitations, anxiety, and insomnia.    Review of Systems: 10 point review of systems negative unless otherwise noted in HPI  Past Medical History:  Past Medical History:  Diagnosis Date  . Anxiety disorder, unspecified    Situational  . Constipation   . Ectopic pregnancy    Right cornual ectopic  . Hypothyroidism   . Obesity (BMI 35.0-39.9 without comorbidity)   . Vaginal Pap smear, abnormal 2014/2015    Past Surgical History:  Past Surgical History:  Procedure Laterality Date  . BREAST ENHANCEMENT SURGERY  2014  . CESAREAN SECTION N/A 06/01/2016   Procedure: PRIMARY LOW TRANSVERSE CESAREAN SECTION;  Surgeon: [name], MD;  Location: [facility];  Service: Obstetrics;  Laterality: N/A;  . COLPOSCOPY  02/25/2012  . LAPAROSCOPIC SALPINGO OOPHERECTOMY Right 01/06/2015   Procedure: LAPAROSCOPIC right SALPINGECTOMYwith removal of ectopic and partial  right corneal resection ;  no oophorectomySurgeon: [name], MD;  Location: [facility];  Service: Gynecology;  Laterality: Right;  . TONSILLECTOMY  2014    Gynecologic History: Patient's last menstrual period was 10/19/2016 (exact date).  Obstetric History: [id number]  Family History:  Family History  Problem Relation Age of Onset  . Heart disease Mother   . Diabetes Mother   . Hyperlipidemia Mother     Social History:  Social History   Social History  . Marital status: Married    Spouse name: N/A  . Number of children: N/A   . Years of education: N/A   Occupational History  . Not on file.   Social History Main Topics  . Smoking status: Never Smoker  . Smokeless tobacco: Never Used  . Alcohol use No     Comment: occas  . Drug use: No  . Sexual activity: Yes    Birth control/ protection: None   Other Topics Concern  . Not on file   Social History Narrative  . No narrative on file    Allergies:  Allergies  Allergen Reactions  . Codeine Anaphylaxis    Medications: Prior to Admission medications   Medication Sig Start Date End Date Taking? Authorizing Provider  phentermine (ADIPEX-P) 37.5 MG tablet Take 1 tablet (37.5 mg total) by mouth daily before breakfast. 09/24/16   [name], MD    Physical Exam Blood pressure 102/68, pulse (!) 109, height 5\' 4"  (1.626 m), weight 228 lb (103.4 kg), last menstrual period 10/19/2016, not currently breastfeeding. Body mass index is 39.14 kg/m. Lbs 13lbs  General: NAD HEENT: normocephalic, anicteric Thyroid: no enlargement Pulmonary: no increased work of breathing Neurologic: Grossly intact Psychiatric: mood appropriate, affect full  Assessment: 31 y.o. [id number] No problem-specific Assessment & Plan notes found for this encounter.   Plan: Problem List Items Addressed This Visit    None    Visit Diagnoses    Class 2 obesity without serious comorbidity with body mass index (BMI) of 39.0 to 39.9 in adult, unspecified obesity type    -  Primary   Relevant Medications   phentermine (  ADIPEX-P) 37.5 MG tablet      1) 1500 Calorie ADA Diet  2) Patient education given regarding appropriate lifestyle changes for weight loss including: regular physical activity, healthy coping strategies, caloric restriction and healthy eating patterns.  3) Patient will be started on weight loss medication. The risks and benefits and side effects of medication, such as Adipex (Phenteramine) ,  Tenuate (Diethylproprion), Belviq (lorcarsin), Contrave  (buproprion/naltrexone), Qsymia (phentermine/topiramate), and Saxenda (liraglutide) is discussed. The pros and cons of suppressing appetite and boosting metabolism is discussed. Risks of tolerence and addiction is discussed for selected agents discussed. Use of medicine will ne short term, such as 3-4 months at a time followed by a period of time off of the medicine to avoid these risks and side effects for Adipex, Qsymia, and Tenuate discussed. Pt to call with any negative side effects and agrees to keep follow up appts.  4) Patient to take medication, with the benefits of appetite suppression and metabolism boost d/w pt, along with the side effects and risk factors of long term use that will be avoided with our use of short bursts of therapy. Rx provided.    5) 15 minutes face-to-face; with counseling/coordination of care > 50 percent of visit related to obesity and ongoing management/treatment   6) Follow up in 4 weeks to assess response

## 2016-10-27 NOTE — Patient Instructions (Signed)
Saxenda (Liraglutide)

## 2016-11-26 ENCOUNTER — Ambulatory Visit: Payer: Managed Care, Other (non HMO) | Admitting: Obstetrics and Gynecology

## 2016-12-03 ENCOUNTER — Ambulatory Visit (INDEPENDENT_AMBULATORY_CARE_PROVIDER_SITE_OTHER): Payer: Managed Care, Other (non HMO) | Admitting: Obstetrics and Gynecology

## 2016-12-03 ENCOUNTER — Encounter: Payer: Self-pay | Admitting: Obstetrics and Gynecology

## 2016-12-03 VITALS — BP 124/70 | HR 109 | Ht 64.0 in | Wt 225.0 lb

## 2016-12-03 DIAGNOSIS — Z6838 Body mass index (BMI) 38.0-38.9, adult: Secondary | ICD-10-CM | POA: Diagnosis not present

## 2016-12-03 DIAGNOSIS — E669 Obesity, unspecified: Secondary | ICD-10-CM | POA: Diagnosis not present

## 2016-12-03 MED ORDER — LIRAGLUTIDE -WEIGHT MANAGEMENT 18 MG/3ML ~~LOC~~ SOPN: 3.0000 mg | PEN_INJECTOR | Freq: Every day | SUBCUTANEOUS | 2 refills | Status: DC

## 2016-12-03 MED ORDER — INSULIN PEN NEEDLE 32G X 6 MM MISC: 1.0000 [IU] | Freq: Every day | 3 refills | Status: DC

## 2016-12-03 NOTE — Progress Notes (Signed)
Gynecology Office Visit  Chief Complaint:  Chief Complaint  Patient presents with  . Weight Check    History of Present Illness: Patientis a 31 y.o. [id number] female, who presents for the evaluation of the desire to lose weight. She has lost additional 3 pounds for 3 month total weight loss of 16lbs. The patient states the following symptoms since starting her weight loss therapy: appetite suppression, energy, and weight loss.  The patient also reports no other ill effects. The patient specifically denies heart palpitations, anxiety, and insomnia.    Review of Systems: 10 point review of systems negative unless otherwise noted in HPI  Past Medical History:  Past Medical History:  Diagnosis Date  . Anxiety disorder, unspecified    Situational  . Constipation   . Ectopic pregnancy    Right cornual ectopic  . Hypothyroidism   . Obesity (BMI 35.0-39.9 without comorbidity)   . Vaginal Pap smear, abnormal 2014/2015    Past Surgical History:  Past Surgical History:  Procedure Laterality Date  . BREAST ENHANCEMENT SURGERY  2014  . CESAREAN SECTION N/A 06/01/2016   Procedure: PRIMARY LOW TRANSVERSE CESAREAN SECTION;  Surgeon: [name], MD;  Location: [facility];  Service: Obstetrics;  Laterality: N/A;  . COLPOSCOPY  02/25/2012  . LAPAROSCOPIC SALPINGO OOPHERECTOMY Right 01/06/2015   Procedure: LAPAROSCOPIC right SALPINGECTOMYwith removal of ectopic and partial  right corneal resection ;  no oophorectomySurgeon: [name], MD;  Location: [facility];  Service: Gynecology;  Laterality: Right;  . TONSILLECTOMY  2014    Gynecologic History: Patient's last menstrual period was 11/19/2016 (exact date).  Obstetric History: [id number]  Family History:  Family History  Problem Relation Age of Onset  . Heart disease Mother   . Diabetes Mother   . Hyperlipidemia Mother     Social History:  Social History   Social History  . Marital status: Married    Spouse name: N/A  .  Number of children: N/A  . Years of education: N/A   Occupational History  . Not on file.   Social History Main Topics  . Smoking status: Never Smoker  . Smokeless tobacco: Never Used  . Alcohol use No     Comment: occas  . Drug use: No  . Sexual activity: Yes    Birth control/ protection: Pill   Other Topics Concern  . Not on file   Social History Narrative  . No narrative on file    Allergies:  Allergies  Allergen Reactions  . Codeine Anaphylaxis    Medications: Prior to Admission medications   Medication Sig Start Date End Date Taking? Authorizing Provider  drospirenone-ethinyl estradiol (OCELLA) 3-0.03 MG tablet Take 1 tablet by mouth daily. 10/27/16  Yes [name], MD  phentermine (ADIPEX-P) 37.5 MG tablet Take 1 tablet (37.5 mg total) by mouth daily before breakfast. 10/27/16  Yes [name], MD    Physical Exam Blood pressure 124/70, pulse (!) 109, height '5\' 4"'$  (1.626 m), weight 225 lb (102.1 kg), last menstrual period 11/19/2016, not currently breastfeeding. Body mass index is 38.62 kg/m.  General: NAD HEENT: normocephalic, anicteric Thyroid: no enlargement Pulmonary: no increased work of breathing Neurologic: Grossly intact Psychiatric: mood appropriate, affect full  Assessment: 31 y.o. [id number] No problem-specific Assessment & Plan notes found for this encounter.   Plan: Problem List Items Addressed This Visit    None    Visit Diagnoses    Class 2 obesity with body mass index (BMI) of 38.0 to 38.9 in  adult, unspecified obesity type, unspecified whether serious comorbidity present    -  Primary      1) 1500 Calorie ADA Diet  2) Patient education given regarding appropriate lifestyle changes for weight loss including: regular physical activity, healthy coping strategies, caloric restriction and healthy eating patterns.  3) Patient will be started on weight loss medication. The risks and benefits and side effects of medication, such as  Adipex (Phenteramine) ,  Tenuate (Diethylproprion), Belviq (lorcarsin), Contrave (buproprion/naltrexone), Qsymia (phentermine/topiramate), and Saxenda (liraglutide) is discussed. The pros and cons of suppressing appetite and boosting metabolism is discussed. Risks of tolerence and addiction is discussed for selected agents discussed. Use of medicine will ne short term, such as 3-4 months at a time followed by a period of time off of the medicine to avoid these risks and side effects for Adipex, Qsymia, and Tenuate discussed. Pt to call with any negative side effects and agrees to keep follow up appts. - has not trialed contrave, phentermine, and diethylproprion - will switch to saxenda, given sample kit and instruction on how to perform injection  4) Patient to take medication, with the benefits of appetite suppression and metabolism boost d/w pt, along with the side effects and risk factors of long term use that will be avoided with our use of short bursts of therapy. Rx provided.    5) 15 minutes face-to-face; with counseling/coordination of care > 50 percent of visit related to obesity and ongoing management/treatment   6) Switch to [place] follow up 3 months

## 2016-12-06 ENCOUNTER — Other Ambulatory Visit: Payer: Self-pay

## 2016-12-06 MED ORDER — INSULIN PEN NEEDLE 32G X 6 MM MISC: 1.0000 [IU] | Freq: Every day | 3 refills | Status: DC

## 2016-12-06 MED ORDER — LIRAGLUTIDE -WEIGHT MANAGEMENT 18 MG/3ML ~~LOC~~ SOPN: 3.0000 mg | PEN_INJECTOR | Freq: Every day | SUBCUTANEOUS | 2 refills | Status: DC

## 2017-03-03 ENCOUNTER — Encounter: Payer: Self-pay | Admitting: Obstetrics and Gynecology

## 2017-03-03 ENCOUNTER — Ambulatory Visit: Payer: Managed Care, Other (non HMO) | Admitting: Obstetrics and Gynecology

## 2017-03-03 ENCOUNTER — Ambulatory Visit (INDEPENDENT_AMBULATORY_CARE_PROVIDER_SITE_OTHER): Payer: Managed Care, Other (non HMO) | Admitting: Obstetrics and Gynecology

## 2017-03-03 VITALS — BP 112/68 | HR 89 | Ht 64.0 in | Wt 238.0 lb

## 2017-03-03 DIAGNOSIS — Z6841 Body Mass Index (BMI) 40.0 and over, adult: Secondary | ICD-10-CM

## 2017-03-03 MED ORDER — PHENTERMINE HCL 37.5 MG PO TABS: 37.5000 mg | ORAL_TABLET | Freq: Every day | ORAL | 0 refills | Status: DC

## 2017-03-03 NOTE — Progress Notes (Signed)
Gynecology Office Visit  Chief Complaint:  Chief Complaint  Patient presents with  . Weight Check    History of Present Illness: Patientis a 32 y.o. [id number] female, who presents for the evaluation of the desire to lose weight. She gained 13 pounds in the past 2 months. The patient is interested in restarting phentermine.  She has started boot camp to increase level of physical activity.  She has also started ketogenic diet.  No new weight related symptoms or co-morbidities.  She has had transient success previously with medical weight loss.  She and her husband are also interested in conceiving again.  She would like to loose some additional weight before this.  Ultimately she is interested in gastric sleeve but would like to hold off until she is done child bearing.   Review of Systems: 10 point review of systems negative unless otherwise noted in HPI  Past Medical History:  Past Medical History:  Diagnosis Date  . Anxiety disorder, unspecified    Situational  . Constipation   . Ectopic pregnancy    Right cornual ectopic  . Hypothyroidism   . Obesity (BMI 35.0-39.9 without comorbidity)   . Vaginal Pap smear, abnormal 2014/2015    Past Surgical History:  Past Surgical History:  Procedure Laterality Date  . BREAST ENHANCEMENT SURGERY  2014  . CESAREAN SECTION N/A 06/01/2016   Procedure: PRIMARY LOW TRANSVERSE CESAREAN SECTION;  Surgeon: [name], MD;  Location: [facility];  Service: Obstetrics;  Laterality: N/A;  . COLPOSCOPY  02/25/2012  . LAPAROSCOPIC SALPINGO OOPHERECTOMY Right 01/06/2015   Procedure: LAPAROSCOPIC right SALPINGECTOMYwith removal of ectopic and partial  right corneal resection ;  no oophorectomySurgeon: [name], MD;  Location: [facility];  Service: Gynecology;  Laterality: Right;  . TONSILLECTOMY  2014    Gynecologic History: Patient's last menstrual period was 02/04/2017.  Obstetric History: [id number]  Family History:  Family History    Problem Relation Age of Onset  . Heart disease Mother   . Diabetes Mother   . Hyperlipidemia Mother     Social History:  Social History   Socioeconomic History  . Marital status: Married    Spouse name: Not on file  . Number of children: Not on file  . Years of education: Not on file  . Highest education level: Not on file  Social Needs  . Financial resource strain: Not on file  . Food insecurity - worry: Not on file  . Food insecurity - inability: Not on file  . Transportation needs - medical: Not on file  . Transportation needs - non-medical: Not on file  Occupational History  . Not on file  Tobacco Use  . Smoking status: Never Smoker  . Smokeless tobacco: Never Used  Substance and Sexual Activity  . Alcohol use: No    Comment: occas  . Drug use: No  . Sexual activity: Yes    Birth control/protection: Pill  Other Topics Concern  . Not on file  Social History Narrative  . Not on file    Allergies:  Allergies  Allergen Reactions  . Codeine Anaphylaxis    Medications: Prior to Admission medications   Medication Sig Start Date End Date Taking? Authorizing Provider  drospirenone-ethinyl estradiol (OCELLA) 3-0.03 MG tablet Take 1 tablet by mouth daily. 10/27/16  Yes [name], MD  phentermine (ADIPEX-P) 37.5 MG tablet Take 1 tablet (37.5 mg total) by mouth daily before breakfast. 03/03/17   [name], MD    Physical Exam  Blood pressure 112/68, pulse 89, height 5\' 4"  (1.626 m), weight 238 lb (108 kg), last menstrual period 02/04/2017, not currently breastfeeding. Body mass index is 40.85 kg/m.   General: NAD HEENT: normocephalic, anicteric Thyroid: no enlargement Pulmonary: no increased work of breathing Neurologic: Grossly intact Psychiatric: mood appropriate, affect full  Assessment: 32 y.o. J6O1157 No problem-specific Assessment & Plan notes found for this encounter.   Plan: Problem List Items Addressed This Visit    None      1)  1500 Calorie ADA Diet  2) Patient education given regarding appropriate lifestyle changes for weight loss including: regular physical activity, healthy coping strategies, caloric restriction and healthy eating patterns.  3) Patient will be started on weight loss medication. The risks and benefits and side effects of medication, such as Adipex (Phenteramine) ,  Tenuate (Diethylproprion), Belviq (lorcarsin), Contrave (buproprion/naltrexone), Qsymia (phentermine/topiramate), and Saxenda (liraglutide) is discussed. The pros and cons of suppressing appetite and boosting metabolism is discussed. Risks of tolerence and addiction is discussed for selected agents discussed. Use of medicine will ne short term, such as 3-4 months at a time followed by a period of time off of the medicine to avoid these risks and side effects for Adipex, Qsymia, and Tenuate discussed. Pt to call with any negative side effects and agrees to keep follow up appts.  4) Patient to take medication, with the benefits of appetite suppression and metabolism boost d/w pt, along with the side effects and risk factors of long term use that will be avoided with our use of short bursts of therapy. Rx provided.    5) 15 minutes face-to-face; with counseling/coordination of care > 50 percent of visit related to obesity and ongoing management/treatment   6) Follow up in 4 weeks to assess response

## 2017-04-01 ENCOUNTER — Ambulatory Visit (INDEPENDENT_AMBULATORY_CARE_PROVIDER_SITE_OTHER): Payer: Managed Care, Other (non HMO) | Admitting: Obstetrics and Gynecology

## 2017-04-01 ENCOUNTER — Encounter: Payer: Self-pay | Admitting: Obstetrics and Gynecology

## 2017-04-01 VITALS — BP 102/68 | HR 99 | Wt 229.0 lb

## 2017-04-01 DIAGNOSIS — Z6839 Body mass index (BMI) 39.0-39.9, adult: Secondary | ICD-10-CM | POA: Diagnosis not present

## 2017-04-01 DIAGNOSIS — E669 Obesity, unspecified: Secondary | ICD-10-CM

## 2017-04-01 MED ORDER — PHENTERMINE HCL 37.5 MG PO TABS: 37.5000 mg | ORAL_TABLET | Freq: Every day | ORAL | 0 refills | Status: DC

## 2017-04-01 NOTE — Progress Notes (Signed)
Gynecology Office Visit  Chief Complaint:  Chief Complaint  Patient presents with  . Weight Check    History of Present Illness: Patientis a 32 y.o. [id number] female, who presents for the evaluation of the desire to lose weight. She has lost 9 pounds. The patient states the following symptoms since starting her weight loss therapy: appetite suppression, energy, and weight loss.  The patient also reports no other ill effects. The patient specifically denies heart palpitations, anxiety, and insomnia.    Review of Systems: 10 point review of systems negative unless otherwise noted in HPI  Past Medical History:  Past Medical History:  Diagnosis Date  . Anxiety disorder, unspecified    Situational  . Constipation   . Ectopic pregnancy    Right cornual ectopic  . Hypothyroidism   . Obesity (BMI 35.0-39.9 without comorbidity)   . Vaginal Pap smear, abnormal 2014/2015    Past Surgical History:  Past Surgical History:  Procedure Laterality Date  . BREAST ENHANCEMENT SURGERY  2014  . CESAREAN SECTION N/A 06/01/2016   Procedure: PRIMARY LOW TRANSVERSE CESAREAN SECTION;  Surgeon: [name], MD;  Location: [facility];  Service: Obstetrics;  Laterality: N/A;  . COLPOSCOPY  02/25/2012  . LAPAROSCOPIC SALPINGO OOPHERECTOMY Right 01/06/2015   Procedure: LAPAROSCOPIC right SALPINGECTOMYwith removal of ectopic and partial  right corneal resection ;  no oophorectomySurgeon: [name], MD;  Location: [facility];  Service: Gynecology;  Laterality: Right;  . TONSILLECTOMY  2014    Gynecologic History: Patient's last menstrual period was 03/30/2017 (exact date).  Obstetric History: [id number]  Family History:  Family History  Problem Relation Age of Onset  . Heart disease Mother   . Diabetes Mother   . Hyperlipidemia Mother     Social History:  Social History   Socioeconomic History  . Marital status: Married    Spouse name: Not on file  . Number of children: Not on file  .  Years of education: Not on file  . Highest education level: Not on file  Social Needs  . Financial resource strain: Not on file  . Food insecurity - worry: Not on file  . Food insecurity - inability: Not on file  . Transportation needs - medical: Not on file  . Transportation needs - non-medical: Not on file  Occupational History  . Not on file  Tobacco Use  . Smoking status: Never Smoker  . Smokeless tobacco: Never Used  Substance and Sexual Activity  . Alcohol use: No    Comment: occas  . Drug use: No  . Sexual activity: Yes    Birth control/protection: Pill  Other Topics Concern  . Not on file  Social History Narrative  . Not on file    Allergies:  Allergies  Allergen Reactions  . Codeine Anaphylaxis    Medications: Prior to Admission medications   Medication Sig Start Date End Date Taking? Authorizing Provider  phentermine (ADIPEX-P) 37.5 MG tablet Take 1 tablet (37.5 mg total) by mouth daily before breakfast. 03/03/17   [name], MD    Physical Exam Blood pressure 102/68, pulse 99, weight 229 lb (103.9 kg), last menstrual period 03/30/2017, not currently breastfeeding. Wt Readings from Last 3 Encounters:  04/01/17 229 lb (103.9 kg)  03/03/17 238 lb (108 kg)  12/03/16 225 lb (102.1 kg)   Body mass index is 39.31 kg/m.  General: NAD HEENT: normocephalic, anicteric Thyroid: no enlargement Pulmonary: no increased work of breathing Neurologic: Grossly intact Psychiatric: mood appropriate, affect full  Assessment: 32 y.o. [id number] No problem-specific Assessment & Plan notes found for this encounter.  Plan: Problem List Items Addressed This Visit    None    Visit Diagnoses    Class 2 obesity without serious comorbidity with body mass index (BMI) of 39.0 to 39.9 in adult, unspecified obesity type    -  Primary   Relevant Medications   phentermine (ADIPEX-P) 37.5 MG tablet      1) 1500 Calorie ADA Diet  2) Patient education given regarding  appropriate lifestyle changes for weight loss including: regular physical activity, healthy coping strategies, caloric restriction and healthy eating patterns.  3) Patient will be started on weight loss medication. The risks and benefits and side effects of medication, such as Adipex (Phenteramine) ,  Tenuate (Diethylproprion), Belviq (lorcarsin), Contrave (buproprion/naltrexone), Qsymia (phentermine/topiramate), and Saxenda (liraglutide) is discussed. The pros and cons of suppressing appetite and boosting metabolism is discussed. Risks of tolerence and addiction is discussed for selected agents discussed. Use of medicine will ne short term, such as 3-4 months at a time followed by a period of time off of the medicine to avoid these risks and side effects for Adipex, Qsymia, and Tenuate discussed. Pt to call with any negative side effects and agrees to keep follow up appts.  4) Patient to take medication, with the benefits of appetite suppression and metabolism boost d/w pt, along with the side effects and risk factors of long term use that will be avoided with our use of short bursts of therapy. Rx provided.  - continue phentermine - is continuing on her diet of low carb and intermittent fasting.  Is interested in obtaining lipid panel next    5) 15 minutes face-to-face; with counseling/coordination of care > 50 percent of visit related to obesity and ongoing management/treatment   6)  Return in about 4 weeks (around 04/29/2017) for wt check.    [name], MD, [facility], [facility] 04/01/2017, 12:49 PM

## 2017-04-29 ENCOUNTER — Encounter: Payer: Self-pay | Admitting: Obstetrics and Gynecology

## 2017-04-29 ENCOUNTER — Ambulatory Visit (INDEPENDENT_AMBULATORY_CARE_PROVIDER_SITE_OTHER): Payer: Managed Care, Other (non HMO) | Admitting: Obstetrics and Gynecology

## 2017-04-29 VITALS — BP 102/62 | HR 83 | Ht 64.0 in | Wt 224.0 lb

## 2017-04-29 DIAGNOSIS — N926 Irregular menstruation, unspecified: Secondary | ICD-10-CM

## 2017-04-29 DIAGNOSIS — E669 Obesity, unspecified: Secondary | ICD-10-CM

## 2017-04-29 DIAGNOSIS — Z6838 Body mass index (BMI) 38.0-38.9, adult: Secondary | ICD-10-CM | POA: Diagnosis not present

## 2017-04-29 MED ORDER — PHENTERMINE HCL 37.5 MG PO TABS: 37.5000 mg | ORAL_TABLET | Freq: Every day | ORAL | 0 refills | Status: DC

## 2017-04-29 NOTE — Progress Notes (Signed)
Gynecology Office Visit  Chief Complaint:  Chief Complaint  Patient presents with  . Weight Check    labwork    History of Present Illness: Patientis a 32 y.o. Q7Y1950 female, who presents for the evaluation of the desire to lose weight. She has lost 5 pounds in 1 month. The patient states the following symptoms since starting her weight loss therapy: appetite suppression, energy, and weight loss.  The patient also reports no other ill effects. The patient specifically denies heart palpitations, anxiety, and insomnia.    Review of Systems: 10 point review of systems negative unless otherwise noted in HPI  Past Medical History:  Past Medical History:  Diagnosis Date  . Anxiety disorder, unspecified    Situational  . Constipation   . Ectopic pregnancy    Right cornual ectopic  . Hypothyroidism   . Obesity (BMI 35.0-39.9 without comorbidity)   . Vaginal Pap smear, abnormal 2014/2015    Past Surgical History:  Past Surgical History:  Procedure Laterality Date  . BREAST ENHANCEMENT SURGERY  2014  . CESAREAN SECTION N/A 06/01/2016   Procedure: PRIMARY LOW TRANSVERSE CESAREAN SECTION;  Surgeon: [name], MD;  Location: [facility];  Service: Obstetrics;  Laterality: N/A;  . COLPOSCOPY  02/25/2012  . LAPAROSCOPIC SALPINGO OOPHERECTOMY Right 01/06/2015   Procedure: LAPAROSCOPIC right SALPINGECTOMYwith removal of ectopic and partial  right corneal resection ;  no oophorectomySurgeon: [name], MD;  Location: [facility];  Service: Gynecology;  Laterality: Right;  . TONSILLECTOMY  2014    Gynecologic History: Patient's last menstrual period was 04/27/2017 (exact date).  Obstetric History: [id number]  Family History:  Family History  Problem Relation Age of Onset  . Heart disease Mother   . Diabetes Mother   . Hyperlipidemia Mother     Social History:  Social History   Socioeconomic History  . Marital status: Married    Spouse name: Not on file  . Number of  children: Not on file  . Years of education: Not on file  . Highest education level: Not on file  Occupational History  . Not on file  Social Needs  . Financial resource strain: Not on file  . Food insecurity:    Worry: Not on file    Inability: Not on file  . Transportation needs:    Medical: Not on file    Non-medical: Not on file  Tobacco Use  . Smoking status: Never Smoker  . Smokeless tobacco: Never Used  Substance and Sexual Activity  . Alcohol use: No    Comment: occas  . Drug use: No  . Sexual activity: Yes    Birth control/protection: Pill  Lifestyle  . Physical activity:    Days per week: 5 days    Minutes per session: 50 min  . Stress: Not at all  Relationships  . Social connections:    Talks on phone: More than three times a week    Gets together: More than three times a week    Attends religious service: More than 4 times per year    Active member of club or organization: Yes    Attends meetings of clubs or organizations: More than 4 times per year    Relationship status: Married  . Intimate partner violence:    Fear of current or ex partner: No    Emotionally abused: No    Physically abused: No    Forced sexual activity: No  Other Topics Concern  . Not on file  Social History Narrative  . Not on file    Allergies:  Allergies  Allergen Reactions  . Codeine Anaphylaxis    Medications: Prior to Admission medications   Medication Sig Start Date End Date Taking? Authorizing Provider  phentermine (ADIPEX-P) 37.5 MG tablet Take 1 tablet (37.5 mg total) by mouth daily before breakfast. 04/01/17  Yes [name], MD    Physical Exam Blood pressure 102/62, pulse 83, height 5\' 4"  (1.626 m), weight 224 lb (101.6 kg), last menstrual period 04/27/2017, not currently breastfeeding. Body mass index is 38.45 kg/m.  Wt Readings from Last 3 Encounters:  04/29/17 224 lb (101.6 kg)  04/01/17 229 lb (103.9 kg)  03/03/17 238 lb (108 kg)    General:  NAD HEENT: normocephalic, anicteric Thyroid: no enlargement Pulmonary: no increased work of breathing Neurologic: Grossly intact Psychiatric: mood appropriate, affect full  Assessment: 32 y.o. Q3E0923 medical weight loss follow up  Plan: Problem List Items Addressed This Visit    None    Visit Diagnoses    Class 2 obesity without serious comorbidity with body mass index (BMI) of 38.0 to 38.9 in adult, unspecified obesity type    -  Primary   Relevant Medications   phentermine (ADIPEX-P) 37.5 MG tablet   Other Relevant Orders   TSH+Prl+FSH+TestT+LH+DHEA S...   Irregular menses       Relevant Orders   TSH+Prl+FSH+TestT+LH+DHEA S...      1) 1500 Calorie ADA Diet  2) Patient education given regarding appropriate lifestyle changes for weight loss including: regular physical activity, healthy coping strategies, caloric restriction and healthy eating patterns.  3) Patient will be started on weight loss medication. The risks and benefits and side effects of medication, such as Adipex (Phenteramine) ,  Tenuate (Diethylproprion), Belviq (lorcarsin), Contrave (buproprion/naltrexone), Qsymia (phentermine/topiramate), and Saxenda (liraglutide) is discussed. The pros and cons of suppressing appetite and boosting metabolism is discussed. Risks of tolerence and addiction is discussed for selected agents discussed. Use of medicine will ne short term, such as 3-4 months at a time followed by a period of time off of the medicine to avoid these risks and side effects for Adipex, Qsymia, and Tenuate discussed. Pt to call with any negative side effects and agrees to keep follow up appts.  4) Patient to take medication, with the benefits of appetite suppression and metabolism boost d/w pt, along with the side effects and risk factors of long term use that will be avoided with our use of short bursts of therapy. Rx provided.   - continue phentermine  5) Trying to conceive - cycles slightly longer intervals  than 28 days will check PCOS panel  6) 15 minutes face-to-face; with counseling/coordination of care > 50 percent of visit related to obesity and ongoing management/treatment   7)  Return in about 1 month (around 05/27/2017) for wt check.    [name], MD, [facility], [facility] 04/29/2017, 12:00 PM

## 2017-05-02 ENCOUNTER — Other Ambulatory Visit: Payer: Self-pay | Admitting: Obstetrics and Gynecology

## 2017-05-02 ENCOUNTER — Other Ambulatory Visit: Payer: Self-pay

## 2017-05-02 ENCOUNTER — Telehealth: Payer: Self-pay

## 2017-05-02 MED ORDER — PHENTERMINE HCL 37.5 MG PO TABS: 37.5000 mg | ORAL_TABLET | Freq: Every day | ORAL | 0 refills | Status: DC

## 2017-05-02 NOTE — Telephone Encounter (Signed)
Pt calling, saw AMS Fri, phentermine was supposed to be called into [facility].  They do not have rx.  [phone]

## 2017-05-02 NOTE — Telephone Encounter (Signed)
Ok just resent it

## 2017-05-02 NOTE — Telephone Encounter (Signed)
Please advise, I changed the pharmacy in chart. It may have been sent to [facility] instead of [facility]

## 2017-05-06 ENCOUNTER — Encounter (INDEPENDENT_AMBULATORY_CARE_PROVIDER_SITE_OTHER): Payer: Self-pay

## 2017-05-06 LAB — TSH+PRL+FSH+TESTT+LH+DHEA S...
17-Hydroxyprogesterone: 26 ng/dL
ANDROSTENEDIONE: 51 ng/dL (ref 41–262)
DHEA-SO4: 109.7 ug/dL (ref 84.8–378.0)
FSH: 5.9 m[IU]/mL
LH: 7.5 m[IU]/mL
PROLACTIN: 9.1 ng/mL (ref 4.8–23.3)
TESTOSTERONE FREE: 0.4 pg/mL (ref 0.0–4.2)
TESTOSTERONE: 13 ng/dL (ref 8–48)
TSH: 0.469 u[IU]/mL (ref 0.450–4.500)

## 2017-05-28 ENCOUNTER — Encounter: Payer: Self-pay | Admitting: Obstetrics and Gynecology

## 2017-05-30 ENCOUNTER — Ambulatory Visit: Payer: Managed Care, Other (non HMO) | Admitting: Obstetrics and Gynecology

## 2017-05-30 ENCOUNTER — Other Ambulatory Visit: Payer: Self-pay | Admitting: Obstetrics and Gynecology

## 2017-05-30 DIAGNOSIS — O3680X0 Pregnancy with inconclusive fetal viability, not applicable or unspecified: Secondary | ICD-10-CM

## 2017-05-30 DIAGNOSIS — O0910 Supervision of pregnancy with history of ectopic or molar pregnancy, unspecified trimester: Secondary | ICD-10-CM

## 2017-05-30 NOTE — Telephone Encounter (Signed)
Called and left voicemail for patient to call back to be schedule for Labs

## 2017-05-30 NOTE — Telephone Encounter (Signed)
Needs appointment for labs

## 2017-05-31 ENCOUNTER — Other Ambulatory Visit: Payer: Managed Care, Other (non HMO)

## 2017-05-31 DIAGNOSIS — O0910 Supervision of pregnancy with history of ectopic or molar pregnancy, unspecified trimester: Secondary | ICD-10-CM

## 2017-05-31 DIAGNOSIS — O3680X0 Pregnancy with inconclusive fetal viability, not applicable or unspecified: Secondary | ICD-10-CM

## 2017-06-01 ENCOUNTER — Encounter (INDEPENDENT_AMBULATORY_CARE_PROVIDER_SITE_OTHER): Payer: Self-pay

## 2017-06-01 LAB — BETA HCG QUANT (REF LAB): hCG Quant: 2190 m[IU]/mL

## 2017-06-02 ENCOUNTER — Other Ambulatory Visit: Payer: Managed Care, Other (non HMO)

## 2017-06-02 DIAGNOSIS — O0910 Supervision of pregnancy with history of ectopic or molar pregnancy, unspecified trimester: Secondary | ICD-10-CM

## 2017-06-02 DIAGNOSIS — O3680X0 Pregnancy with inconclusive fetal viability, not applicable or unspecified: Secondary | ICD-10-CM

## 2017-06-03 LAB — BETA HCG QUANT (REF LAB): hCG Quant: 4531 m[IU]/mL

## 2017-06-06 ENCOUNTER — Other Ambulatory Visit: Payer: Self-pay | Admitting: Obstetrics and Gynecology

## 2017-06-06 DIAGNOSIS — O0911 Supervision of pregnancy with history of ectopic or molar pregnancy, first trimester: Secondary | ICD-10-CM

## 2017-06-06 DIAGNOSIS — Z3689 Encounter for other specified antenatal screening: Secondary | ICD-10-CM

## 2017-06-06 DIAGNOSIS — O3680X0 Pregnancy with inconclusive fetal viability, not applicable or unspecified: Secondary | ICD-10-CM

## 2017-06-06 MED ORDER — DOXYLAMINE-PYRIDOXINE ER 20-20 MG PO TBCR: 1.0000 | EXTENDED_RELEASE_TABLET | Freq: Two times a day (BID) | ORAL | 2 refills | Status: AC

## 2017-06-06 NOTE — Progress Notes (Signed)
n

## 2017-06-06 NOTE — Telephone Encounter (Signed)
Ultrasound later this week, I know limited clinic time so can be ultrasound and I'll call her with results

## 2017-06-07 ENCOUNTER — Other Ambulatory Visit: Payer: Managed Care, Other (non HMO)

## 2017-06-07 ENCOUNTER — Ambulatory Visit (INDEPENDENT_AMBULATORY_CARE_PROVIDER_SITE_OTHER): Payer: Managed Care, Other (non HMO)

## 2017-06-07 DIAGNOSIS — O3680X0 Pregnancy with inconclusive fetal viability, not applicable or unspecified: Secondary | ICD-10-CM | POA: Diagnosis not present

## 2017-06-07 DIAGNOSIS — O283 Abnormal ultrasonic finding on antenatal screening of mother: Secondary | ICD-10-CM

## 2017-06-07 DIAGNOSIS — O0911 Supervision of pregnancy with history of ectopic or molar pregnancy, first trimester: Secondary | ICD-10-CM

## 2017-06-07 DIAGNOSIS — Z3689 Encounter for other specified antenatal screening: Secondary | ICD-10-CM

## 2017-06-10 ENCOUNTER — Encounter: Payer: Self-pay | Admitting: Obstetrics and Gynecology

## 2017-06-10 ENCOUNTER — Ambulatory Visit: Payer: Managed Care, Other (non HMO) | Admitting: Obstetrics and Gynecology

## 2017-06-10 ENCOUNTER — Ambulatory Visit (INDEPENDENT_AMBULATORY_CARE_PROVIDER_SITE_OTHER): Payer: Managed Care, Other (non HMO) | Admitting: Obstetrics and Gynecology

## 2017-06-10 VITALS — BP 106/60 | HR 73 | Wt 224.0 lb

## 2017-06-10 DIAGNOSIS — Z8759 Personal history of other complications of pregnancy, childbirth and the puerperium: Secondary | ICD-10-CM

## 2017-06-10 DIAGNOSIS — Z3689 Encounter for other specified antenatal screening: Secondary | ICD-10-CM

## 2017-06-10 DIAGNOSIS — D62 Acute posthemorrhagic anemia: Secondary | ICD-10-CM

## 2017-06-10 DIAGNOSIS — O34219 Maternal care for unspecified type scar from previous cesarean delivery: Secondary | ICD-10-CM

## 2017-06-10 DIAGNOSIS — O0910 Supervision of pregnancy with history of ectopic or molar pregnancy, unspecified trimester: Secondary | ICD-10-CM

## 2017-06-10 DIAGNOSIS — O3680X0 Pregnancy with inconclusive fetal viability, not applicable or unspecified: Secondary | ICD-10-CM

## 2017-06-10 DIAGNOSIS — O0990 Supervision of high risk pregnancy, unspecified, unspecified trimester: Secondary | ICD-10-CM

## 2017-06-10 NOTE — Progress Notes (Signed)
NOB Discuss dating U/S on 5/7 N&V

## 2017-06-11 ENCOUNTER — Encounter: Payer: Self-pay | Admitting: Obstetrics and Gynecology

## 2017-06-11 ENCOUNTER — Encounter (INDEPENDENT_AMBULATORY_CARE_PROVIDER_SITE_OTHER): Payer: Self-pay

## 2017-06-11 DIAGNOSIS — O0990 Supervision of high risk pregnancy, unspecified, unspecified trimester: Secondary | ICD-10-CM | POA: Insufficient documentation

## 2017-06-11 DIAGNOSIS — O34219 Maternal care for unspecified type scar from previous cesarean delivery: Secondary | ICD-10-CM | POA: Insufficient documentation

## 2017-06-11 LAB — BETA HCG QUANT (REF LAB): hCG Quant: 46227 m[IU]/mL

## 2017-06-11 NOTE — Progress Notes (Signed)
New Obstetric Patient H&P    Chief Complaint: "Desires prenatal care"   History of Present Illness: Patient is a 32 y.o. [id number] Not Hispanic or Latino female, presents with amenorrhea and positive home pregnancy test. Patient's last menstrual period was 04/29/2017. and based on her  LMP, her EDD is Estimated Date of Delivery: 02/03/18 and her EGA is [redacted]w[redacted]d. Cycles are regular monthly. Her last pap smear was 08/20/2014 NIL HPV negative   She had a urine pregnancy test which was positive 2 week(s)  ago. Her last menstrual period was normal. Since her LMP she claims she has experienced nausea, fatigue, breast tenderness. She denies vaginal bleeding. Her past medical history is noncontributory. Her prior pregnancies are notable for history of right cornual ectopic, history of Cesarean section  Since her LMP, she admits to the use of tobacco products  no She claims she has gained   no pounds since the start of her pregnancy.  There are cats in the home in the home  no  She admits close contact with children on a regular basis  yes  She has had chicken pox in the past yes She has had Tuberculosis exposures, symptoms, or previously tested positive for TB   no Current or past history of domestic violence. no  Genetic Screening/Teratology Counseling: (Includes patient, baby's father, or anyone in either family with:)   21. Patient's age >/= 56 [facility]  no 2. Thalassemia ([place], [place], [place], or Asian background): MCV<80  no 3. Neural tube defect (meningomyelocele, spina bifida, anencephaly)  no 4. Congenital heart defect  no  5. Down syndrome  no 6. Tay-Sachs (Jewish, [place])  no 7. Canavan's Disease  no 8. Sickle cell disease or trait (African)  no  9. Hemophilia or other blood disorders  no  10. Muscular dystrophy  no  11. Cystic fibrosis  no  12. Huntington's Chorea  no  13. Mental retardation/autism  no 14. Other inherited genetic or chromosomal disorder  no 15.  Maternal metabolic disorder (DM, PKU, etc)  no 16. Patient or FOB with a child with a birth defect not listed above no  16a. Patient or FOB with a birth defect themselves no 17. Recurrent pregnancy loss, or stillbirth  no  18. Any medications since LMP other than prenatal vitamins (include vitamins, supplements, OTC meds, drugs, alcohol)  no 19. Any other genetic/environmental exposure to discuss  no  Infection History:   1. Lives with someone with TB or TB exposed  no  2. Patient or partner has history of genital herpes  no 3. Rash or viral illness since LMP  no 4. History of STI (GC, CT, HPV, syphilis, HIV)  no 5. History of recent travel :  no  Other pertinent information:  no     Review of Systems:10 point review of systems negative unless otherwise noted in HPI  Past Medical History:  Past Medical History:  Diagnosis Date  . Anxiety disorder, unspecified    Situational  . Constipation   . Ectopic pregnancy    Right cornual ectopic  . Hypothyroidism   . Obesity (BMI 35.0-39.9 without comorbidity)   . Vaginal Pap smear, abnormal 2014/2015    Past Surgical History:  Past Surgical History:  Procedure Laterality Date  . BREAST ENHANCEMENT SURGERY  2014  . CESAREAN SECTION N/A 06/01/2016   Procedure: PRIMARY LOW TRANSVERSE CESAREAN SECTION;  Surgeon: [name], MD;  Location: [facility] ORS;  Service: Obstetrics;  Laterality: N/A;  . COLPOSCOPY  02/25/2012  . LAPAROSCOPIC SALPINGO OOPHERECTOMY Right 01/06/2015   Procedure: LAPAROSCOPIC right SALPINGECTOMYwith removal of ectopic and partial  right corneal resection ;  no oophorectomySurgeon: [name], MD;  Location: [facility] ORS;  Service: Gynecology;  Laterality: Right;  . TONSILLECTOMY  2014    Gynecologic History: Patient's last menstrual period was 04/29/2017.  Obstetric History: [id number]  Family History:  Family History  Problem Relation Age of Onset  . Heart disease Mother   . Diabetes Mother   .  Hyperlipidemia Mother     Social History:  Social History   Socioeconomic History  . Marital status: Married    Spouse name: Not on file  . Number of children: Not on file  . Years of education: Not on file  . Highest education level: Not on file  Occupational History  . Not on file  Social Needs  . Financial resource strain: Not on file  . Food insecurity:    Worry: Not on file    Inability: Not on file  . Transportation needs:    Medical: Not on file    Non-medical: Not on file  Tobacco Use  . Smoking status: Never Smoker  . Smokeless tobacco: Never Used  Substance and Sexual Activity  . Alcohol use: No    Comment: occas  . Drug use: No  . Sexual activity: Yes    Birth control/protection: Pill  Lifestyle  . Physical activity:    Days per week: 5 days    Minutes per session: 50 min  . Stress: Not at all  Relationships  . Social connections:    Talks on phone: More than three times a week    Gets together: More than three times a week    Attends religious service: More than 4 times per year    Active member of club or organization: Yes    Attends meetings of clubs or organizations: More than 4 times per year    Relationship status: Married  . Intimate partner violence:    Fear of current or ex partner: No    Emotionally abused: No    Physically abused: No    Forced sexual activity: No  Other Topics Concern  . Not on file  Social History Narrative  . Not on file    Allergies:  Allergies  Allergen Reactions  . Codeine Anaphylaxis    Medications: Prior to Admission medications   Medication Sig Start Date End Date Taking? Authorizing Provider  Doxylamine-Pyridoxine ER (BONJESTA) 20-20 MG TBCR Take 1 tablet by mouth 2 (two) times daily. 06/06/17 08/05/17 Yes [name], MD    Physical Exam Vitals: Blood pressure 106/60, pulse 73, weight 224 lb (101.6 kg), last menstrual period 04/29/2017, not currently breastfeeding.  General: NAD HEENT:  normocephalic, anicteric Pulmonary: No increased work of breathing, CTAB Cardiovascular: RRR, distal pulses 2+ Abdomen: Soft, non-tender, non-distended.  Umbilicus without lesions.  No hepatomegaly, splenomegaly or masses palpable. No evidence of hernia  Genitourinary:  External: Normal external female genitalia.  Normal urethral meatus, normal  Bartholin's and Skene's glands.    Vagina: Normal vaginal mucosa, no evidence of prolapse.    Cervix: Grossly normal in appearance, no bleeding  Uterus:  Non-enlarged, mobile, normal contour.  No CMT  Adnexa: ovaries non-enlarged, no adnexal masses  Rectal: deferred Extremities: no edema, erythema, or tenderness Neurologic: Grossly intact Psychiatric: mood appropriate, affect full  US Ob Comp Less 14 Wks  Result Date: 06/10/2017 ULTRASOUND REPORT Patient Name: Kellie Davis DOB: 1985/03/17 MRN:  [id number] Location: [facility] Date of Service: 06/07/2017 Indications:r/o ectopic Findings: A single gestational sac with a single yolk sac is visualized. Embryonic pole is not identified at this time. Gestational sac is too small to calculate gestational age. The (U/S) EDD is not consistent with the clinically established EDD of 02/03/2018. FHR: n/a CRL measurement: n/a Yolk sac is visualized and appears normal and early anatomy is normal. Amnion: not visualized Right Ovary is normal in appearance. Left Ovary is normal appearance. Corpus luteal cyst:  is not visualized Survey of the adnexa demonstrates no adnexal masses. There is no free peritoneal fluid in the cul de sac. Impression: 1. Single gestational sac and yolk sac identified without an embryo. Recommendations: 1.Clinical correlation with the patient's History and Physical Exam. 2. Follow up for viability in 1-2 weeks. Edwena Bunde, RDMS, RVT There is a singleton gestation intrauterine gestation of uncertain fetal viability.  The fetal biometry does not correlate with established dating. Detailed  evaluation of the fetal anatomy is precluded by early gestational age.  It must be noted that a normal ultrasound particular at this early gestational age is unable to rule out fetal aneuploidy, risk of first trimester miscarriage, or anatomic birth defects. Discernable heartbeat should be visualized 11 days after initial ultrasound showing a gestational sac with  yolk sac present.  "Society of Radiologyst in Ultrasound Guidelines for Transvaginal Ultrasonographic Diagnosis of Early Pregnancy Loss" and adopted in [place] Number 150, May 2015 (reaffirmed 2017) "Early Pregnancy Loss" [name], MD, [facility], [facility] 06/10/2017, 2:21 PM    Assessment: 32 y.o. [id number] at [redacted]w[redacted]d presenting to initiate prenatal care  Plan: 1) Avoid alcoholic beverages. 2) Patient encouraged not to smoke.  3) Discontinue the use of all non-medicinal drugs and chemicals.  4) Take prenatal vitamins daily.  5) Nutrition, food safety (fish, cheese advisories, and high nitrite foods) and exercise discussed. 6) Hospital and practice style discussed with cross coverage system.  7) Genetic Screening, such as with 1st Trimester Screening, cell free fetal DNA, AFP testing, and Ultrasound, as well as with amniocentesis and CVS as appropriate, is discussed with patient. At the conclusion of today's visit patient requested genetic testing 8) Patient is asked about travel to areas at risk for the Zika virus, and counseled to avoid travel and exposure to mosquitoes or sexual partners who may have themselves been exposed to the virus. Testing is discussed, and will be ordered as appropriate.   [name], MD, [facility], [facility] 06/11/2017, 12:49 PM

## 2017-06-20 ENCOUNTER — Other Ambulatory Visit: Payer: Self-pay | Admitting: Obstetrics and Gynecology

## 2017-06-20 ENCOUNTER — Ambulatory Visit (INDEPENDENT_AMBULATORY_CARE_PROVIDER_SITE_OTHER): Payer: Managed Care, Other (non HMO)

## 2017-06-20 ENCOUNTER — Ambulatory Visit (INDEPENDENT_AMBULATORY_CARE_PROVIDER_SITE_OTHER): Payer: Managed Care, Other (non HMO) | Admitting: Obstetrics and Gynecology

## 2017-06-20 DIAGNOSIS — O3680X0 Pregnancy with inconclusive fetal viability, not applicable or unspecified: Secondary | ICD-10-CM

## 2017-06-20 DIAGNOSIS — O0990 Supervision of high risk pregnancy, unspecified, unspecified trimester: Secondary | ICD-10-CM

## 2017-06-20 DIAGNOSIS — Z3689 Encounter for other specified antenatal screening: Secondary | ICD-10-CM

## 2017-06-20 MED ORDER — ONDANSETRON 4 MG PO TBDP: 4.0000 mg | ORAL_TABLET | Freq: Four times a day (QID) | ORAL | 0 refills | Status: DC | PRN

## 2017-06-20 NOTE — Progress Notes (Signed)
ROB Needs Zofran U/S today

## 2017-06-21 ENCOUNTER — Encounter: Payer: Self-pay | Admitting: Obstetrics and Gynecology

## 2017-06-23 NOTE — Progress Notes (Signed)
Routine Prenatal Care Visit  Subjective  Kellie Davis is a 32 y.o. [phone] at [redacted]w[redacted]d being seen today for ongoing prenatal care.  She is currently monitored for the following issues for this low-risk pregnancy and has History of hypothyroidism; Pregnancy with history of ectopic pregnancy, antepartum; Avitaminosis D; Hypercholesterolemia without hypertriglyceridemia; Supervision of high risk pregnancy, antepartum; and Pregnancy with history of cesarean section, antepartum on their problem list.  ----------------------------------------------------------------------------------- Patient reports nausea.    .  .   . Denies leaking of fluid.  ----------------------------------------------------------------------------------- The following portions of the patient's history were reviewed and updated as appropriate: allergies, current medications, past family history, past medical history, past social history, past surgical history and problem list. Problem list updated.   Objective  Blood pressure 100/62, weight 222 lb (100.7 kg), last menstrual period 04/29/2017, not currently breastfeeding. Pregravid weight 224 lb (101.6 kg) Total Weight Gain -2 lb (-0.907 kg) Urinalysis:      Fetal Status:           General:  Alert, oriented and cooperative. Patient is in no acute distress.  Skin: Skin is warm and dry. No rash noted.   Cardiovascular: Normal heart rate noted  Respiratory: Normal respiratory effort, no problems with respiration noted  Abdomen: Soft, gravid, appropriate for gestational age.       Pelvic:  Cervical exam deferred        Extremities: Normal range of motion.     ental Status: Normal mood and affect. Normal behavior. Normal judgment and thought content.   US Ob Comp Less 14 Wks  Result Date: 06/10/2017 ULTRASOUND REPORT Patient Name: Kellie Davis DOB: 1985-08-25 MRN: [id number] Location: [facility] Date of Service: 06/07/2017 Indications:r/o ectopic Findings: A single  gestational sac with a single yolk sac is visualized. Embryonic pole is not identified at this time. Gestational sac is too small to calculate gestational age. The (U/S) EDD is not consistent with the clinically established EDD of 02/03/2018. FHR: n/a CRL measurement: n/a Yolk sac is visualized and appears normal and early anatomy is normal. Amnion: not visualized Right Ovary is normal in appearance. Left Ovary is normal appearance. Corpus luteal cyst:  is not visualized Survey of the adnexa demonstrates no adnexal masses. There is no free peritoneal fluid in the cul de sac. Impression: 1. Single gestational sac and yolk sac identified without an embryo. Recommendations: 1.Clinical correlation with the patient's History and Physical Exam. 2. Follow up for viability in 1-2 weeks. Edwena Bunde, RDMS, RVT There is a singleton gestation intrauterine gestation of uncertain fetal viability.  The fetal biometry does not correlate with established dating. Detailed evaluation of the fetal anatomy is precluded by early gestational age.  It must be noted that a normal ultrasound particular at this early gestational age is unable to rule out fetal aneuploidy, risk of first trimester miscarriage, or anatomic birth defects. Discernable heartbeat should be visualized 11 days after initial ultrasound showing a gestational sac with  yolk sac present.  "Society of Radiologyst in Ultrasound Guidelines for Transvaginal Ultrasonographic Diagnosis of Early Pregnancy Loss" and adopted in [place] Number 150, May 2015 (reaffirmed 2017) "Early Pregnancy Loss" [name], MD, [facility], [facility] 06/10/2017, 2:21 PM   US Ob Transvaginal  Result Date: 06/20/2017 ULTRASOUND REPORT Location: [facility] Date of Service: 06/20/2017 Patient Name: Kellie Davis DOB: 05-17-1985 MRN: [id number] Indications:dating Findings: Kellie Davis intrauterine pregnancy is visualized with a CRL consistent  with [redacted]w[redacted]d gestation, giving  an (U/S) EDD of 02/01/2018. The (U/S) EDD is consistent with the clinically established EDD of 02/03/2018. FHR: 154 bpm. CRL measurement: 13.9 mm Yolk sac is visualized and appears normal and early anatomy is normal. Amnion: visualized and appears normal Right Ovary is normal in appearance. Left Ovary is normal appearance. Corpus luteal cyst:  Right ovary Survey of the adnexa demonstrates no adnexal masses. There is no free peritoneal fluid in the cul de sac. Impression: 1. [redacted]w[redacted]d Viable Singleton Intrauterine pregnancy by U/S. 2. (U/S) EDD is consistent with Clinically established EDD of 02/03/2018. Recommendations: 1.Clinical correlation with the patient's History and Physical Exam. Mital bahen P Patel, RDMS There is a viable singleton gestation.  The fetal biometry correlates with established dating. Detailed evaluation of the fetal anatomy is precluded by early gestational age.  It must be noted that a normal ultrasound particular at this early gestational age is unable to rule out fetal aneuploidy, risk of first trimester miscarriage, or anatomic birth defects. [name], MD, [facility], [facility] 06/20/2017, 5:21 PM     Assessment   32 y.o. [id number] at [redacted]w[redacted]d by  02/03/2018, by Last Menstrual Period presenting for routine prenatal visit   Plan   Pregnancy #5 Problems (from 04/29/17 to present)    Problem Noted Resolved   Supervision of high risk pregnancy, antepartum 06/11/2017 by [name], MD No   Overview Signed 06/11/2017  1:03 PM by [name], MD    [facility]inic [facility] Prenatal Labs  Dating LMP=7week [place] Blood type:     Genetic Screen NIPS: Antibody:   Anatomic [place]  Rubella:   Varicella: @VZVIGG @  GTT Early:               Third trimester:  RPR:     Rhogam  HBsAg:     TDaP vaccine                       Flu Shot: HIV:     Baby Food                                GBS:   Contraception  Pap:  CBB     CS/VBAC C-section     Support Person Husband: [name]           Pregnancy with history of ectopic pregnancy, antepartum 04/28/2016 by Gae Dry, MD No   Overview Signed 06/11/2017 12:59 PM by [name], MD    History of cornual ectopic treat as classic C-section          Gestational age appropriate obstetric precautions including but not limited to vaginal bleeding, contractions, leaking of fluid and fetal movement were reviewed in detail with the patient.   - cell free fetal DNA next visit with NOB panel - viable singleton IUP on ultrasound today  Return in about 3 weeks (around 07/11/2017) for 3-4 weeks ROB-Osa [place].  [name], MD, [facility], [facility] 06/23/2017, 7:05 PM

## 2017-07-12 ENCOUNTER — Ambulatory Visit (INDEPENDENT_AMBULATORY_CARE_PROVIDER_SITE_OTHER): Payer: Managed Care, Other (non HMO) | Admitting: Obstetrics and Gynecology

## 2017-07-12 VITALS — BP 98/62 | Wt 224.0 lb

## 2017-07-12 DIAGNOSIS — O0990 Supervision of high risk pregnancy, unspecified, unspecified trimester: Secondary | ICD-10-CM

## 2017-07-12 DIAGNOSIS — E039 Hypothyroidism, unspecified: Secondary | ICD-10-CM

## 2017-07-12 DIAGNOSIS — Z3480 Encounter for supervision of other normal pregnancy, unspecified trimester: Secondary | ICD-10-CM

## 2017-07-12 DIAGNOSIS — Z1379 Encounter for other screening for genetic and chromosomal anomalies: Secondary | ICD-10-CM

## 2017-07-12 DIAGNOSIS — Z3A10 10 weeks gestation of pregnancy: Secondary | ICD-10-CM

## 2017-07-12 DIAGNOSIS — Z315 Encounter for genetic counseling: Secondary | ICD-10-CM

## 2017-07-12 LAB — OB RESULTS CONSOLE VARICELLA ZOSTER ANTIBODY, IGG: VARICELLA IGG: IMMUNE

## 2017-07-12 NOTE — Progress Notes (Signed)
ROB Headaches/taking Magnesium

## 2017-07-13 NOTE — Progress Notes (Signed)
Routine Prenatal Care Visit  Subjective  Kellie Davis is a 32 y.o. [phone] at [redacted]w[redacted]d being seen today for ongoing prenatal care.  She is currently monitored for the following issues for this high-risk pregnancy and has History of hypothyroidism; Pregnancy with history of ectopic pregnancy, antepartum; Avitaminosis D; Hypercholesterolemia without hypertriglyceridemia; Supervision of high risk pregnancy, antepartum; and Pregnancy with history of cesarean section, antepartum on their problem list.  ----------------------------------------------------------------------------------- Patient reports headache.    . Vag. Bleeding: None.  Movement: Absent. Denies leaking of fluid.  ----------------------------------------------------------------------------------- The following portions of the patient's history were reviewed and updated as appropriate: allergies, current medications, past family history, past medical history, past social history, past surgical history and problem list. Problem list updated.   Objective  Blood pressure 98/62, weight 224 lb (101.6 kg), last menstrual period 04/29/2017, not currently breastfeeding. Pregravid weight 224 lb (101.6 kg) Total Weight Gain 0 lb (0 kg) Urinalysis:      Fetal Status: Fetal Heart Rate (bpm): 170   Movement: Absent     General:  Alert, oriented and cooperative. Patient is in no acute distress.  Skin: Skin is warm and dry. No rash noted.   Cardiovascular: Normal heart rate noted  Respiratory: Normal respiratory effort, no problems with respiration noted  Abdomen: Soft, gravid, appropriate for gestational age.       Pelvic:  Cervical exam deferred        Extremities: Normal range of motion.     ental Status: Normal mood and affect. Normal behavior. Normal judgment and thought content.     Assessment   32 y.o. [id number] at [redacted]w[redacted]d by  02/03/2018, by Last Menstrual Period presenting for routine prenatal visit  Plan   Pregnancy #5  Problems (from 04/29/17 to present)    Problem Noted Resolved   Supervision of high risk pregnancy, antepartum 06/11/2017 by [name], MD No   Overview Addendum 06/23/2017  7:08 PM by [name], MD    [facility] Prenatal Labs  Dating LMP=[redacted]w[redacted]d [place] Blood type:     Genetic Screen  NIPS: Antibody:   Anatomic [place]  Rubella:   Varicella: @VZVIGG @  GTT Early:               Third trimester:  RPR:     Rhogam  HBsAg:     TDaP vaccine                       Flu Shot: HIV:     Baby Food                                GBS:   Contraception  Pap: 08/21/2015 NIL HPV negative  CBB     CS/VBAC C-section   Support Person Husband: [name]           Pregnancy with history of ectopic pregnancy, antepartum 04/28/2016 by Gae Dry, MD No   Overview Signed 06/11/2017 12:59 PM by [name], MD    History of cornual ectopic treat as classic C-section          Gestational age appropriate obstetric precautions including but not limited to vaginal bleeding, contractions, leaking of fluid and fetal movement were reviewed in detail with the patient.   - NOB labs and genetic testing today - Thyroid panel and HgbA1C  Return in about 1 month (around 08/09/2017) for ROB.  [name], MD, [facility], [facility] 07/13/2017, 1:25 PM

## 2017-07-18 ENCOUNTER — Encounter: Payer: Self-pay | Admitting: Obstetrics and Gynecology

## 2017-07-19 LAB — INFORMASEQ(SM) WITH XY ANALYSIS
Fetal Fraction (%):: 14.4
Fetal Number: 1
Gestational Age at Collection: 10.6 weeks
Weight: 224 [lb_av]

## 2017-07-20 ENCOUNTER — Encounter: Payer: Self-pay | Admitting: Obstetrics and Gynecology

## 2017-07-20 ENCOUNTER — Other Ambulatory Visit: Payer: Self-pay | Admitting: Obstetrics and Gynecology

## 2017-07-20 DIAGNOSIS — Z3A12 12 weeks gestation of pregnancy: Secondary | ICD-10-CM

## 2017-07-20 DIAGNOSIS — Z1379 Encounter for other screening for genetic and chromosomal anomalies: Secondary | ICD-10-CM

## 2017-07-20 DIAGNOSIS — O0990 Supervision of high risk pregnancy, unspecified, unspecified trimester: Secondary | ICD-10-CM

## 2017-07-20 NOTE — Telephone Encounter (Signed)
Patient is schedule 07/29/17 at 10:30 for NT scan and follow up with AMS

## 2017-07-20 NOTE — Telephone Encounter (Signed)
-----   Message from [name], MD sent at 07/20/2017  1:10 PM EDT ----- Regarding: NT Ultrasound NT ultrasound sometime in the next week can have same day  follow up with me or just the ultrasound either is fine with me

## 2017-07-21 ENCOUNTER — Encounter (INDEPENDENT_AMBULATORY_CARE_PROVIDER_SITE_OTHER): Payer: Self-pay

## 2017-07-21 LAB — RPR+RH+ABO+RUB AB+AB SCR+CB...
Antibody Screen: NEGATIVE
HEMOGLOBIN: 12.9 g/dL (ref 11.1–15.9)
HEP B S AG: NEGATIVE
HIV Screen 4th Generation wRfx: NONREACTIVE
Hematocrit: 37.8 % (ref 34.0–46.6)
MCH: 31.1 pg (ref 26.6–33.0)
MCHC: 34.1 g/dL (ref 31.5–35.7)
MCV: 91 fL (ref 79–97)
PLATELETS: 204 10*3/uL (ref 150–450)
RBC: 4.15 x10E6/uL (ref 3.77–5.28)
RDW: 12.7 % (ref 12.3–15.4)
RPR Ser Ql: NONREACTIVE
RUBELLA: 8.31 {index} (ref >0.99)
Rh Factor: POSITIVE
Varicella zoster IgG: 1409 index (ref >165)
WBC: 10.6 10*3/uL (ref 3.4–10.8)

## 2017-07-21 LAB — INHERITEST CORE(CF97,SMA,FRAX)

## 2017-07-21 LAB — THYROID PANEL WITH TSH
FREE THYROXINE INDEX: 2 (ref 1.2–4.9)
T3 Uptake Ratio: 27 % (ref 24–39)
T4 TOTAL: 7.3 ug/dL (ref 4.5–12.0)
TSH: 0.468 u[IU]/mL (ref 0.450–4.500)

## 2017-07-21 LAB — HEMOGLOBIN A1C
Est. average glucose Bld gHb Est-mCnc: 82 mg/dL
Hgb A1c MFr Bld: 4.5 % — ABNORMAL LOW (ref 4.8–5.6)

## 2017-07-29 ENCOUNTER — Ambulatory Visit (INDEPENDENT_AMBULATORY_CARE_PROVIDER_SITE_OTHER): Payer: Managed Care, Other (non HMO)

## 2017-07-29 ENCOUNTER — Ambulatory Visit (INDEPENDENT_AMBULATORY_CARE_PROVIDER_SITE_OTHER): Payer: Managed Care, Other (non HMO) | Admitting: Obstetrics and Gynecology

## 2017-07-29 VITALS — BP 108/68 | Wt 222.0 lb

## 2017-07-29 DIAGNOSIS — O09291 Supervision of pregnancy with other poor reproductive or obstetric history, first trimester: Secondary | ICD-10-CM

## 2017-07-29 DIAGNOSIS — Z1379 Encounter for other screening for genetic and chromosomal anomalies: Secondary | ICD-10-CM

## 2017-07-29 DIAGNOSIS — Z3A13 13 weeks gestation of pregnancy: Secondary | ICD-10-CM

## 2017-07-29 DIAGNOSIS — O0990 Supervision of high risk pregnancy, unspecified, unspecified trimester: Secondary | ICD-10-CM

## 2017-07-29 DIAGNOSIS — O0910 Supervision of pregnancy with history of ectopic or molar pregnancy, unspecified trimester: Secondary | ICD-10-CM

## 2017-07-29 DIAGNOSIS — Z3A12 12 weeks gestation of pregnancy: Secondary | ICD-10-CM

## 2017-07-29 DIAGNOSIS — O34219 Maternal care for unspecified type scar from previous cesarean delivery: Secondary | ICD-10-CM

## 2017-08-01 NOTE — Progress Notes (Signed)
Routine Prenatal Care Visit  Subjective  Kellie Davis is a 32 y.o. [phone] at [redacted]w[redacted]d being seen today for ongoing prenatal care.  She is currently monitored for the following issues for this low-risk pregnancy and has History of hypothyroidism; Pregnancy with history of ectopic pregnancy, antepartum; Avitaminosis D; Hypercholesterolemia without hypertriglyceridemia; Supervision of high risk pregnancy, antepartum; and Pregnancy with history of cesarean section, antepartum on their problem list.  ----------------------------------------------------------------------------------- Patient reports no complaints.    . Vag. Bleeding: None.   . Denies leaking of fluid.  ----------------------------------------------------------------------------------- The following portions of the patient's history were reviewed and updated as appropriate: allergies, current medications, past family history, past medical history, past social history, past surgical history and problem list. Problem list updated.   Objective  Blood pressure 108/68, weight 222 lb (100.7 kg), last menstrual period 04/29/2017, not currently breastfeeding. Pregravid weight 224 lb (101.6 kg) Total Weight Gain -2 lb (-0.907 kg) Urinalysis:      Fetal Status: Fetal Heart Rate (bpm): 150         General:  Alert, oriented and cooperative. Patient is in no acute distress.  Skin: Skin is warm and dry. No rash noted.   Cardiovascular: Normal heart rate noted  Respiratory: Normal respiratory effort, no problems with respiration noted  Abdomen: Soft, gravid, appropriate for gestational age.       Pelvic:  Cervical exam deferred        Extremities: Normal range of motion.     ental Status: Normal mood and affect. Normal behavior. Normal judgment and thought content.   US Fetal Nuchal Translucency Measurement  Result Date: 07/29/2017 Patient Name: Kellie Davis DOB: 1985-09-21 MRN: [id number] ULTRASOUND REPORT Location: [facility]  Date of Service: 07/29/2017 Indications:First Trimester Screen - NT Findings: Singleton intrauterine pregnancy is visualized with a CRL consistent with [redacted]w[redacted]d  gestation, giving an (U/S) EDD of 01/28/2018. The (U/S) EDD is consistent with the clinically established Estimated Date of Delivery: 02/03/18. FHR: 149 bpm CRL measurement: 77.7 mm NT measurement: 1.5 mm. Yolk sac and and early anatomy is normal. Amnion is visualized. Nasal Bone is Present Right Ovary is normal in appearance. Left Adnexa is in appearance. Survey of the adnexa demonstrates no adnexal masses. There is no free peritoneal fluid in the cul de sac. Impression: 1. [redacted]w[redacted]d viable Singleton Intrauterine pregnancy by U/S. 2. (U/S) EDD is consistent with Clinically established Estimated Date of Delivery: 02/03/18. 3. NT Screen is successfully completed. Recommendations: 1.Clinical correlation with the patient's History and Physical Exam. Mital bahen P Patel, RDMS There is a viable  singleton gestation.  The fetal biometry correlates with established dating Detailed evaluation of the fetal anatomy is precluded by early gestational age.The NT measurement is less than 65mm.    It must be noted that a normal ultrasound is unable to definitively rule out fetal aneuploidy.  [name], MD, [facility], [facility] 07/29/2017, 11:09 AM.     Assessment   32 y.o. [id number] at [redacted]w[redacted]d by  02/03/2018, by Last Menstrual Period presenting for work-in prenatal visit  Plan   Pregnancy #5 Problems (from 04/29/17 to present)    Problem Noted Resolved   Supervision of high risk pregnancy, antepartum 06/11/2017 by [name], MD No   Overview Addendum 07/19/2017  8:38 AM by [name], MD    Clinic [facility] Prenatal Labs  Dating LMP=[redacted]w[redacted]d [place] Blood type:     Genetic Screen NIPS: XY Inheritest Antibody:  Anatomic [place]  Rubella:  Varicella: @VZVIGG @  GTT Early:               Third trimester:  RPR:     Rhogam  HBsAg:     TDaP  vaccine                       Flu Shot: HIV:     Baby Food                                GBS:   Contraception  Pap: 08/21/2014 NIL HPV negative  CBB     CS/VBAC C-section   Support Person Husband: [name]           Pregnancy with history of ectopic pregnancy, antepartum 04/28/2016 by Gae Dry, MD No   Overview Signed 06/11/2017 12:59 PM by [name], MD    History of cornual ectopic treat as classic C-section          Gestational age appropriate obstetric precautions including but not limited to vaginal bleeding, contractions, leaking of fluid and fetal movement were reviewed in detail with the patient.    Return in about 2 weeks (around 08/12/2017) for rob (already scheduled).  [name], MD, [facility], [facility] 08/01/2017, 11:04 AM

## 2017-08-11 ENCOUNTER — Ambulatory Visit (INDEPENDENT_AMBULATORY_CARE_PROVIDER_SITE_OTHER): Payer: Managed Care, Other (non HMO) | Admitting: Obstetrics and Gynecology

## 2017-08-11 VITALS — BP 102/60 | Wt 228.0 lb

## 2017-08-11 DIAGNOSIS — O0911 Supervision of pregnancy with history of ectopic or molar pregnancy, first trimester: Secondary | ICD-10-CM

## 2017-08-11 DIAGNOSIS — O0990 Supervision of high risk pregnancy, unspecified, unspecified trimester: Secondary | ICD-10-CM

## 2017-08-11 DIAGNOSIS — Z8639 Personal history of other endocrine, nutritional and metabolic disease: Secondary | ICD-10-CM

## 2017-08-11 DIAGNOSIS — Z3A14 14 weeks gestation of pregnancy: Secondary | ICD-10-CM

## 2017-08-11 DIAGNOSIS — O0910 Supervision of pregnancy with history of ectopic or molar pregnancy, unspecified trimester: Secondary | ICD-10-CM

## 2017-08-11 DIAGNOSIS — Z363 Encounter for antenatal screening for malformations: Secondary | ICD-10-CM

## 2017-08-11 DIAGNOSIS — O34219 Maternal care for unspecified type scar from previous cesarean delivery: Secondary | ICD-10-CM

## 2017-08-11 MED ORDER — PROCHLORPERAZINE MALEATE 10 MG PO TABS: 10.0000 mg | ORAL_TABLET | Freq: Four times a day (QID) | ORAL | 3 refills | Status: DC | PRN

## 2017-08-11 NOTE — Progress Notes (Signed)
    Routine Prenatal Care Visit  Subjective  Kellie Davis is a 32 y.o. [phone] at [redacted]w[redacted]d being seen today for ongoing prenatal care.  She is currently monitored for the following issues for this high-risk pregnancy and has History of hypothyroidism; Pregnancy with history of ectopic pregnancy, antepartum; Avitaminosis D; Hypercholesterolemia without hypertriglyceridemia; Supervision of high risk pregnancy, antepartum; and Pregnancy with history of cesarean section, antepartum on their problem list.  ----------------------------------------------------------------------------------- Patient reports headache.   Contractions: Not present. Vag. Bleeding: None.  Movement: Absent. Denies leaking of fluid.  ----------------------------------------------------------------------------------- The following portions of the patient's history were reviewed and updated as appropriate: allergies, current medications, past family history, past medical history, past social history, past surgical history and problem list. Problem list updated.   Objective  Blood pressure 102/60, weight 228 lb (103.4 kg), last menstrual period 04/29/2017, not currently breastfeeding. Pregravid weight 224 lb (101.6 kg) Total Weight Gain 4 lb (1.814 kg) Urinalysis: Urine Protein: Negative Urine Glucose: Negative  Fetal Status: Fetal Heart Rate (bpm): 145   Movement: Absent     General:  Alert, oriented and cooperative. Patient is in no acute distress.  Skin: Skin is warm and dry. No rash noted.   Cardiovascular: Normal heart rate noted  Respiratory: Normal respiratory effort, no problems with respiration noted  Abdomen: Soft, gravid, appropriate for gestational age. Pain/Pressure: Absent     Pelvic:  Cervical exam deferred        Extremities: Normal range of motion.     ental Status: Normal mood and affect. Normal behavior. Normal judgment and thought content.     Assessment   32 y.o. [id number] at [redacted]w[redacted]d by  02/03/2018, by  Last Menstrual Period presenting for routine prenatal visit  Plan   Pregnancy #5 Problems (from 04/29/17 to present)    Problem Noted Resolved   Supervision of high risk pregnancy, antepartum 06/11/2017 by [name], MD No   Overview Addendum 07/19/2017  8:38 AM by [name], MD    Clinic [facility] Prenatal Labs  Dating LMP=[redacted]w[redacted]d [place] Blood type:   A pos  Genetic Screen NIPS: XY Inheritest Antibody: neg  Anatomic [place]  Rubella: Immune Varicella: Immune  GTT Early: HgbA1C 4.5 Third trimester:  RPR:   NR  Rhogam N/A HBsAg:   neg  TDaP vaccine                       Flu Shot: HIV:  neg  Baby Food                                GBS:   Contraception  Pap: 08/21/2014 NIL HPV negative  CBB     CS/VBAC C-section   Support Person Husband: [name]           Pregnancy with history of ectopic pregnancy, antepartum 04/28/2016 by Gae Dry, MD No   Overview Signed 06/11/2017 12:59 PM by [name], MD    History of cornual ectopic treat as classic C-section          Gestational age appropriate obstetric precautions including but not limited to vaginal bleeding, contractions, leaking of fluid and fetal movement were reviewed in detail with the patient.   - trial of compazine for headaches  Return in about 1 month (around 09/08/2017) for ROB and anatomy scan.  [name], MD, [facility], [facility] 08/11/2017, 8:32 AM

## 2017-08-11 NOTE — Progress Notes (Signed)
ROB

## 2017-08-17 ENCOUNTER — Other Ambulatory Visit: Payer: Self-pay

## 2017-08-17 ENCOUNTER — Encounter: Payer: Self-pay | Admitting: Obstetrics and Gynecology

## 2017-08-17 ENCOUNTER — Other Ambulatory Visit: Payer: Self-pay | Admitting: Obstetrics and Gynecology

## 2017-08-17 ENCOUNTER — Ambulatory Visit (INDEPENDENT_AMBULATORY_CARE_PROVIDER_SITE_OTHER): Payer: Managed Care, Other (non HMO)

## 2017-08-17 DIAGNOSIS — N390 Urinary tract infection, site not specified: Secondary | ICD-10-CM

## 2017-08-17 DIAGNOSIS — R300 Dysuria: Secondary | ICD-10-CM

## 2017-08-17 DIAGNOSIS — Z3A15 15 weeks gestation of pregnancy: Secondary | ICD-10-CM

## 2017-08-17 LAB — POCT URINALYSIS DIPSTICK
Bilirubin, UA: NEGATIVE
GLUCOSE UA: NEGATIVE
Nitrite, UA: NEGATIVE
PH UA: 5 (ref 5.0–8.0)
Protein, UA: POSITIVE — AB
SPEC GRAV UA: 1.01 (ref 1.010–1.025)
UROBILINOGEN UA: NEGATIVE U/dL — AB

## 2017-08-17 MED ORDER — NITROFURANTOIN MONOHYD MACRO 100 MG PO CAPS: 100.0000 mg | ORAL_CAPSULE | Freq: Two times a day (BID) | ORAL | 1 refills | Status: DC

## 2017-08-19 ENCOUNTER — Encounter (INDEPENDENT_AMBULATORY_CARE_PROVIDER_SITE_OTHER): Payer: Self-pay

## 2017-08-19 LAB — URINE CULTURE

## 2017-08-19 LAB — SPECIMEN STATUS REPORT

## 2017-09-07 ENCOUNTER — Ambulatory Visit (INDEPENDENT_AMBULATORY_CARE_PROVIDER_SITE_OTHER): Payer: Managed Care, Other (non HMO) | Admitting: Obstetrics and Gynecology

## 2017-09-07 ENCOUNTER — Ambulatory Visit (INDEPENDENT_AMBULATORY_CARE_PROVIDER_SITE_OTHER): Payer: Managed Care, Other (non HMO)

## 2017-09-07 VITALS — BP 100/84 | Wt 233.0 lb

## 2017-09-07 DIAGNOSIS — Z363 Encounter for antenatal screening for malformations: Secondary | ICD-10-CM

## 2017-09-07 DIAGNOSIS — O0990 Supervision of high risk pregnancy, unspecified, unspecified trimester: Secondary | ICD-10-CM

## 2017-09-07 DIAGNOSIS — O34219 Maternal care for unspecified type scar from previous cesarean delivery: Secondary | ICD-10-CM

## 2017-09-07 DIAGNOSIS — O0910 Supervision of pregnancy with history of ectopic or molar pregnancy, unspecified trimester: Secondary | ICD-10-CM

## 2017-09-07 DIAGNOSIS — Z0489 Encounter for examination and observation for other specified reasons: Secondary | ICD-10-CM

## 2017-09-07 DIAGNOSIS — Z3A18 18 weeks gestation of pregnancy: Secondary | ICD-10-CM

## 2017-09-07 DIAGNOSIS — IMO0002 Reserved for concepts with insufficient information to code with codable children: Secondary | ICD-10-CM

## 2017-09-07 DIAGNOSIS — O4442 Low lying placenta NOS or without hemorrhage, second trimester: Secondary | ICD-10-CM

## 2017-09-07 DIAGNOSIS — O0912 Supervision of pregnancy with history of ectopic or molar pregnancy, second trimester: Secondary | ICD-10-CM

## 2017-09-07 NOTE — Progress Notes (Signed)
Routine Prenatal Care Visit  Subjective  Kellie Davis is a 32 y.o. [phone] at [redacted]w[redacted]d being seen today for ongoing prenatal care.  She is currently monitored for the following issues for this high-risk pregnancy and has History of hypothyroidism; Pregnancy with history of ectopic pregnancy, antepartum; Avitaminosis D; Hypercholesterolemia without hypertriglyceridemia; Supervision of high risk pregnancy, antepartum; Pregnancy with history of cesarean section, antepartum; and Low lying placenta nos or without hemorrhage, second trimester on their problem list.  ----------------------------------------------------------------------------------- Patient reports no complaints.    .  .   . Denies leaking of fluid.  ----------------------------------------------------------------------------------- The following portions of the patient's history were reviewed and updated as appropriate: allergies, current medications, past family history, past medical history, past social history, past surgical history and problem list. Problem list updated.   Objective  Blood pressure 100/84, weight 233 lb (105.7 kg), last menstrual period 04/29/2017, not currently breastfeeding. Pregravid weight 224 lb (101.6 kg) Total Weight Gain 9 lb (4.082 kg)  Body mass index is 39.99 kg/m.  Urinalysis: Urine Protein: Negative Urine Glucose: Negative  Fetal Status:           General:  Alert, oriented and cooperative. Patient is in no acute distress.  Skin: Skin is warm and Davis. No rash noted.   Cardiovascular: Normal heart rate noted  Respiratory: Normal respiratory effort, no problems with respiration noted  Abdomen: Soft, gravid, appropriate for gestational age.       Pelvic:  Cervical exam deferred        Extremities: Normal range of motion.     ental Status: Normal Davis and affect. Normal behavior. Normal judgment and thought content.   US Ob Comp + 14 Wk  Result Date: 09/07/2017 ULTRASOUND REPORT Patient  Name: Kellie Davis DOB: [birth year] MRN: [id number] Location: [facility] Date of Service: 09/07/2017 Indications:Anatomy Ultrasound Findings: [name] intrauterine pregnancy is visualized with FHR at 141 BPM. Biometrics give an (U/S) Gestational age of [redacted]w[redacted]d and an (U/S) EDD of 02/04/2018; this correlates with the clinically established Estimated Date of Delivery: 02/03/18 Fetal presentation is Cephalic. EFW: 10oz/275g. Placenta: Anterior, Marginal Previa, grade 0. AFI: subjectively normal. Anatomic survey is incomplete for cardiac and diaphragm views and normal; Gender - female.  Right Ovary is normal in appearance. Left Ovary is normal appearance. Survey of the adnexa demonstrates no adnexal masses. There is no free peritoneal fluid in the cul de sac. Impression: 1. [redacted]w[redacted]d Viable Singleton Intrauterine pregnancy by U/S. 2. (U/S) EDD is consistent with Clinically established Estimated Date of Delivery: 02/03/18 . 3. Normal Anatomy Scan Recommendations: 1.Clinical correlation with the patient's History and Physical Exam. 2. F/U in 4 weeks to complete anatomic survey and at 28 weeks for Placenta Location Kellie Davis, RDMS, RVT  There is a singleton gestation with subjectively normal amniotic fluid volume. The fetal biometry correlates with established dating. Detailed evaluation of the fetal anatomy was performed.The fetal anatomical survey appears within normal limits within the resolution of ultrasound as described above, incomplete for cardiac and diaphragm views.  The placenta is noted to be low lying 1.24cm from the cervical os.  It must be noted that a normal ultrasound is unable to rule out fetal aneuploidy.  Kellie Mood, MD, [facility], [facility] 09/07/2017, 4:33 PM     Assessment   32 y.o. [id number] at [redacted]w[redacted]d by  02/03/2018, by Last Menstrual Period presenting for routine prenatal visit  Plan   Pregnancy #5 Problems (from 04/29/17 to present)  Problem Noted Resolved     Low lying placenta nos or without hemorrhage, second trimester 09/07/2017 by Kellie Mood, MD No   Overview Signed 09/07/2017  4:35 PM by Kellie Mood, MD    1.24cm from cervical os at [redacted]w[redacted]d, re-image at 28 weeks      Supervision of high risk pregnancy, antepartum 06/11/2017 by Kellie Mood, MD No   Overview Addendum 08/11/2017  8:21 AM by Kellie Mood, MD    [facility]inic [facility] Prenatal Labs  Dating LMP=[redacted]w[redacted]d [place] Blood type: A pos  Genetic Screen NIPS: XY Inheritest Antibody: negative  Anatomic [place]  Rubella: Immune Varicella: Immune  GTT Early:               Third trimester:  RPR: NR  Rhogam  HBsAg: negative  TDaP vaccine                       Flu Shot: HIV: negative  Baby Food                                GBS:   Contraception  Pap: 08/21/2014 NIL HPV negative  CBB     CS/VBAC C-section   Support Person Husband: Kellie Davis           Pregnancy with history of ectopic pregnancy, antepartum 04/28/2016 by Kellie Dry, MD No   Overview Signed 06/11/2017 12:59 PM by Kellie Mood, MD    History of cornual ectopic treat as classic C-section          Gestational age appropriate obstetric precautions including but not limited to vaginal bleeding, contractions, leaking of fluid and fetal movement were reviewed in detail with the patient.    No follow-ups on file.  Kellie Mood, MD, [facility], [facility] 09/07/2017, 4:35 PM

## 2017-09-07 NOTE — Progress Notes (Signed)
ROB  °Anatomy scan °

## 2017-09-09 ENCOUNTER — Encounter: Payer: Managed Care, Other (non HMO) | Admitting: Obstetrics and Gynecology

## 2017-09-09 ENCOUNTER — Other Ambulatory Visit: Payer: Managed Care, Other (non HMO)

## 2017-10-05 ENCOUNTER — Ambulatory Visit (INDEPENDENT_AMBULATORY_CARE_PROVIDER_SITE_OTHER): Payer: Managed Care, Other (non HMO) | Admitting: Obstetrics and Gynecology

## 2017-10-05 ENCOUNTER — Ambulatory Visit (INDEPENDENT_AMBULATORY_CARE_PROVIDER_SITE_OTHER): Payer: Managed Care, Other (non HMO)

## 2017-10-05 VITALS — BP 104/68 | Wt 242.0 lb

## 2017-10-05 DIAGNOSIS — Z3A23 23 weeks gestation of pregnancy: Secondary | ICD-10-CM

## 2017-10-05 DIAGNOSIS — Z3A22 22 weeks gestation of pregnancy: Secondary | ICD-10-CM | POA: Diagnosis not present

## 2017-10-05 DIAGNOSIS — Z3A18 18 weeks gestation of pregnancy: Secondary | ICD-10-CM

## 2017-10-05 DIAGNOSIS — O34219 Maternal care for unspecified type scar from previous cesarean delivery: Secondary | ICD-10-CM

## 2017-10-05 DIAGNOSIS — O4442 Low lying placenta NOS or without hemorrhage, second trimester: Secondary | ICD-10-CM

## 2017-10-05 DIAGNOSIS — Z0489 Encounter for examination and observation for other specified reasons: Secondary | ICD-10-CM

## 2017-10-05 DIAGNOSIS — IMO0002 Reserved for concepts with insufficient information to code with codable children: Secondary | ICD-10-CM

## 2017-10-05 DIAGNOSIS — L309 Dermatitis, unspecified: Secondary | ICD-10-CM

## 2017-10-05 DIAGNOSIS — O09292 Supervision of pregnancy with other poor reproductive or obstetric history, second trimester: Secondary | ICD-10-CM

## 2017-10-05 DIAGNOSIS — O9989 Other specified diseases and conditions complicating pregnancy, childbirth and the puerperium: Secondary | ICD-10-CM

## 2017-10-05 DIAGNOSIS — O0990 Supervision of high risk pregnancy, unspecified, unspecified trimester: Secondary | ICD-10-CM

## 2017-10-05 LAB — POCT URINALYSIS DIPSTICK OB
Glucose, UA: NEGATIVE
PROTEIN: NEGATIVE

## 2017-10-05 MED ORDER — TRIAMCINOLONE ACETONIDE 0.1 % EX CREA: 1.0000 "application " | TOPICAL_CREAM | Freq: Two times a day (BID) | CUTANEOUS | 1 refills | Status: DC

## 2017-10-05 NOTE — Progress Notes (Signed)
ROB  °Anatomy scan °

## 2017-10-07 NOTE — Progress Notes (Addendum)
Routine Prenatal Care Visit  Subjective  Kellie Davis is a 32 y.o. [phone] at [redacted]w[redacted]d being seen today for ongoing prenatal care.  She is currently monitored for the following issues for this high-risk pregnancy and has History of hypothyroidism; Pregnancy with history of ectopic pregnancy, antepartum; Avitaminosis D; Hypercholesterolemia without hypertriglyceridemia; Supervision of high risk pregnancy, antepartum; Pregnancy with history of cesarean section, antepartum; and Low lying placenta nos or without hemorrhage, second trimester on their problem list.  ----------------------------------------------------------------------------------- Patient reports no complaints.   Contractions: Not present. Vag. Bleeding: None.  Movement: Present. Denies leaking of fluid.  ----------------------------------------------------------------------------------- The following portions of the patient's history were reviewed and updated as appropriate: allergies, current medications, past family history, past medical history, past social history, past surgical history and problem list. Problem list updated.   Objective  Blood pressure 104/68, weight 242 lb (109.8 kg), last menstrual period 04/29/2017, not currently breastfeeding. Pregravid weight 224 lb (101.6 kg) Total Weight Gain 18 lb (8.165 kg) Urinalysis:      Fetal Status: Fetal Heart Rate (bpm): 150   Movement: Present     General:  Alert, oriented and cooperative. Patient is in no acute distress.  Skin: Skin is warm and dry. No rash noted.   Cardiovascular: Normal heart rate noted  Respiratory: Normal respiratory effort, no problems with respiration noted  Abdomen: Soft, gravid, appropriate for gestational age. Pain/Pressure: Absent     Pelvic:  Cervical exam deferred        Extremities: Normal range of motion.     ental Status: Normal mood and affect. Normal behavior. Normal judgment and thought content.   US Ob Comp + 14 Wk  Result  Date: 09/07/2017 ULTRASOUND REPORT Patient Name: Kellie Davis DOB: November 03, 1985 MRN: [id number] Location: [facility] Date of Service: 09/07/2017 Indications:Anatomy Ultrasound Findings: Kellie Davis intrauterine pregnancy is visualized with FHR at 141 BPM. Biometrics give an (U/S) Gestational age of [redacted]w[redacted]d and an (U/S) EDD of 02/04/2018; this correlates with the clinically established Estimated Date of Delivery: 02/03/18 Fetal presentation is Cephalic. EFW: 10oz/275g. Placenta: Anterior, Marginal Previa, grade 0. AFI: subjectively normal. Anatomic survey is incomplete for cardiac and diaphragm views and normal; Gender - female.  Right Ovary is normal in appearance. Left Ovary is normal appearance. Survey of the adnexa demonstrates no adnexal masses. There is no free peritoneal fluid in the cul de sac. Impression: 1. [redacted]w[redacted]d Viable Singleton Intrauterine pregnancy by U/S. 2. (U/S) EDD is consistent with Clinically established Estimated Date of Delivery: 02/03/18 . 3. Normal Anatomy Scan Recommendations: 1.Clinical correlation with the patient's History and Physical Exam. 2. F/U in 4 weeks to complete anatomic survey and at 28 weeks for Placenta Location Kellie Davis, RDMS, RVT  There is a singleton gestation with subjectively normal amniotic fluid volume. The fetal biometry correlates with established dating. Detailed evaluation of the fetal anatomy was performed.The fetal anatomical survey appears within normal limits within the resolution of ultrasound as described above, incomplete for cardiac and diaphragm views.  The placenta is noted to be low lying 1.24cm from the cervical os.  It must be noted that a normal ultrasound is unable to rule out fetal aneuploidy.  [name], MD, [facility], [facility] 09/07/2017, 4:33 PM   US Ob Follow Up  Result Date: 10/05/2017 ULTRASOUND REPORT Location: [facility] Date of Service: 10/05/2017 Patient Name: Kellie Davis DOB: 29-May-1985 MRN:  [id number] Indications: Anatomy follow up ultrasound Findings: Kellie Davis intrauterine pregnancy is visualized with FHR  at 136 BPM. Fetal presentation is Cephalic. Placenta: Anterior, Grade 1, Placenta to cervix distance is 3.06 cm. AFI: subjectively normal. Anatomic survey is complete for cardiac views and diaphragm. There is no free peritoneal fluid in the cul de sac. Impression: 1. [redacted]w[redacted]d Viable Singleton Intrauterine pregnancy previously established criteria. 2. Normal Anatomy Scan is now complete Recommendations: 1.Clinical correlation with the patient's History and Physical Exam. Kellie Davis, RDMS There is a singleton gestation with subjectively normal amniotic fluid volume.  Limited evaluation of the fetal anatomy was performed today, focusing on on anatomic structures not fully visualized at the time of prior study.The visualized fetal anatomical survey appears within normal limits within the resolution of ultrasound as described above, and the anatomic survey is now complete, low lying placenta has resolved.  It must be noted that a normal ultrasound is unable to rule out fetal aneuploidy.  [name], MD, [facility], [facility] 10/05/2017, 4:54 PM     Assessment   32 y.o. [id number] at [redacted]w[redacted]d by  02/03/2018, by Last Menstrual Period presenting for routine prenatal visit  Plan   Pregnancy #5 Problems (from 04/29/17 to present)    Problem Noted Resolved   Low lying placenta nos or without hemorrhage, second trimester 09/07/2017 by [name], MD No   Overview Addendum 10/05/2017  4:28 PM by [name], MD    1.24cm from cervical os at [redacted]w[redacted]d Resolved 3.06cm at 22 weeks      Supervision of high risk pregnancy, antepartum 06/11/2017 by [name], MD No   Overview Addendum 08/11/2017  8:21 AM by [name], MD    [facility]inic [facility] Prenatal Labs  Dating LMP=[redacted]w[redacted]d [place] Blood type: A pos  Genetic Screen NIPS: XY Inheritest Antibody: negative    Anatomic [place]  Rubella: Immune Varicella: Immune  GTT Early:               Third trimester:  RPR: NR  Rhogam  HBsAg: negative  TDaP vaccine                       Flu Shot: HIV: negative  Baby Food                                GBS:   Contraception  Pap: 08/21/2014 NIL HPV negative  CBB     CS/VBAC C-section   Support Person Husband: [name]           Pregnancy with history of ectopic pregnancy, antepartum 04/28/2016 by Gae Dry, MD No   Overview Signed 06/11/2017 12:59 PM by [name], MD    History of cornual ectopic treat as classic C-section          Gestational age appropriate obstetric precautions including but not limited to vaginal bleeding, contractions, leaking of fluid and fetal movement were reviewed in detail with the patient.   - anatomy scan complete, low lying placenta resolved - eczema on dorsal aspect of foot trial of triamcinolone  Return in about 4 weeks (around 11/02/2017) for 4 weeks ROB, 6 weeks ROB and 28 week labs.  [name], MD, [facility], [facility] 10/07/2017, 12:37 AM

## 2017-11-02 ENCOUNTER — Ambulatory Visit (INDEPENDENT_AMBULATORY_CARE_PROVIDER_SITE_OTHER): Payer: Managed Care, Other (non HMO) | Admitting: Obstetrics and Gynecology

## 2017-11-02 VITALS — BP 104/66 | Wt 243.0 lb

## 2017-11-02 DIAGNOSIS — O99210 Obesity complicating pregnancy, unspecified trimester: Secondary | ICD-10-CM

## 2017-11-02 DIAGNOSIS — E7800 Pure hypercholesterolemia, unspecified: Secondary | ICD-10-CM

## 2017-11-02 DIAGNOSIS — O34219 Maternal care for unspecified type scar from previous cesarean delivery: Secondary | ICD-10-CM

## 2017-11-02 DIAGNOSIS — Z8639 Personal history of other endocrine, nutritional and metabolic disease: Secondary | ICD-10-CM

## 2017-11-02 DIAGNOSIS — Z3A26 26 weeks gestation of pregnancy: Secondary | ICD-10-CM

## 2017-11-02 DIAGNOSIS — O0910 Supervision of pregnancy with history of ectopic or molar pregnancy, unspecified trimester: Secondary | ICD-10-CM

## 2017-11-02 DIAGNOSIS — O4442 Low lying placenta NOS or without hemorrhage, second trimester: Secondary | ICD-10-CM

## 2017-11-02 DIAGNOSIS — O0990 Supervision of high risk pregnancy, unspecified, unspecified trimester: Secondary | ICD-10-CM

## 2017-11-02 LAB — POCT URINALYSIS DIPSTICK OB
GLUCOSE, UA: NEGATIVE
POC,PROTEIN,UA: NEGATIVE

## 2017-11-02 NOTE — Progress Notes (Signed)
Routine Prenatal Care Visit  Subjective  Kellie Davis is a 32 y.o. [phone] at [redacted]w[redacted]d being seen today for ongoing prenatal care.  She is currently monitored for the following issues for this high-risk pregnancy and has History of hypothyroidism; Pregnancy with history of ectopic pregnancy, antepartum; Avitaminosis D; Hypercholesterolemia without hypertriglyceridemia; Supervision of high risk pregnancy, antepartum; Pregnancy with history of cesarean section, antepartum; and Low lying placenta nos or without hemorrhage, second trimester on their problem list.  ----------------------------------------------------------------------------------- Patient reports no complaints.   Contractions: Not present. Vag. Bleeding: None.  Movement: Present. Denies leaking of fluid.  ----------------------------------------------------------------------------------- The following portions of the patient's history were reviewed and updated as appropriate: allergies, current medications, past family history, past medical history, past social history, past surgical history and problem list. Problem list updated.   Objective  Weight 243 lb (110.2 kg), last menstrual period 04/29/2017, not currently breastfeeding. Body mass index is 41.71 kg/m.  Pregravid weight 224 lb (101.6 kg) Total Weight Gain 19 lb (8.618 kg) Urinalysis:      Fetal Status: Fetal Heart Rate (bpm): 140 Fundal Height: 26 cm Movement: Present     General:  Alert, oriented and cooperative. Patient is in no acute distress.  Skin: Skin is warm and dry. No rash noted.   Cardiovascular: Normal heart rate noted  Respiratory: Normal respiratory effort, no problems with respiration noted  Abdomen: Soft, gravid, appropriate for gestational age. Pain/Pressure: Absent     Pelvic:  Cervical exam deferred        Extremities: Normal range of motion.     ental Status: Normal mood and affect. Normal behavior. Normal judgment and thought content.      Assessment   32 y.o. [id number] at [redacted]w[redacted]d by  02/03/2018, by Last Menstrual Period presenting for routine prenatal visit  Plan   Pregnancy #5 Problems (from 04/29/17 to present)    Problem Noted Resolved   Low lying placenta nos or without hemorrhage, second trimester 09/07/2017 by [name], MD No   Overview Addendum 10/05/2017  4:28 PM by [name], MD    1.24cm from cervical os at [redacted]w[redacted]d Resolved 3.06cm at 22 weeks      Supervision of high risk pregnancy, antepartum 06/11/2017 by [name], MD No   Overview Addendum 08/11/2017  8:21 AM by [name], MD    [facility]inic [facility] Prenatal Labs  Dating LMP=[redacted]w[redacted]d [place] Blood type: A pos  Genetic Screen NIPS: XY Inheritest Antibody: negative  Anatomic [place]  Rubella: Immune Varicella: Immune  GTT Early:               Third trimester:  RPR: NR  Rhogam  HBsAg: negative  TDaP vaccine                       Flu Shot: HIV: negative  Baby Food                                GBS:   Contraception  Pap: 08/21/2014 NIL HPV negative  CBB     CS/VBAC C-section   Support Person Husband: [name]           Pregnancy with history of ectopic pregnancy, antepartum 04/28/2016 by Gae Dry, MD No   Overview Signed 06/11/2017 12:59 PM by [name], MD    History of cornual ectopic treat as classic C-section  Gestational age appropriate obstetric precautions including but not limited to vaginal bleeding, contractions, leaking of fluid and fetal movement were reviewed in detail with the patient.    - 28 weeks and growth scan 2 weeks  Return in about 2 weeks (around 11/16/2017) for ROB and 28 week labs/growth scan.  [name], MD, [facility], [facility] 11/02/2017, 3:56 PM

## 2017-11-02 NOTE — Progress Notes (Signed)
ROB Flu vaccine given

## 2017-11-16 ENCOUNTER — Ambulatory Visit (INDEPENDENT_AMBULATORY_CARE_PROVIDER_SITE_OTHER): Payer: Managed Care, Other (non HMO) | Admitting: Obstetrics and Gynecology

## 2017-11-16 ENCOUNTER — Ambulatory Visit (INDEPENDENT_AMBULATORY_CARE_PROVIDER_SITE_OTHER): Payer: Managed Care, Other (non HMO)

## 2017-11-16 ENCOUNTER — Encounter: Payer: Managed Care, Other (non HMO) | Admitting: Obstetrics and Gynecology

## 2017-11-16 ENCOUNTER — Other Ambulatory Visit: Payer: Managed Care, Other (non HMO)

## 2017-11-16 VITALS — BP 102/62 | Wt 247.0 lb

## 2017-11-16 DIAGNOSIS — O34219 Maternal care for unspecified type scar from previous cesarean delivery: Secondary | ICD-10-CM

## 2017-11-16 DIAGNOSIS — Z8639 Personal history of other endocrine, nutritional and metabolic disease: Secondary | ICD-10-CM

## 2017-11-16 DIAGNOSIS — O4442 Low lying placenta NOS or without hemorrhage, second trimester: Secondary | ICD-10-CM

## 2017-11-16 DIAGNOSIS — O0990 Supervision of high risk pregnancy, unspecified, unspecified trimester: Secondary | ICD-10-CM

## 2017-11-16 DIAGNOSIS — Z3A28 28 weeks gestation of pregnancy: Secondary | ICD-10-CM

## 2017-11-16 DIAGNOSIS — Z362 Encounter for other antenatal screening follow-up: Secondary | ICD-10-CM

## 2017-11-16 DIAGNOSIS — O99210 Obesity complicating pregnancy, unspecified trimester: Secondary | ICD-10-CM

## 2017-11-16 LAB — POCT URINALYSIS DIPSTICK OB
Glucose, UA: NEGATIVE
POC,PROTEIN,UA: NEGATIVE

## 2017-11-16 NOTE — Progress Notes (Signed)
ROB Growth Ultrasound 28 week labs

## 2017-11-16 NOTE — Progress Notes (Signed)
Routine Prenatal Care Visit  Subjective  Kellie Davis is a 32 y.o. [phone] at [redacted]w[redacted]d being seen today for ongoing prenatal care.  She is currently monitored for the following issues for this high-risk pregnancy and has History of hypothyroidism; Pregnancy with history of ectopic pregnancy, antepartum; Avitaminosis D; Hypercholesterolemia without hypertriglyceridemia; Supervision of high risk pregnancy, antepartum; Pregnancy with history of cesarean section, antepartum; and Low lying placenta nos or without hemorrhage, second trimester on their problem list.  ----------------------------------------------------------------------------------- Patient reports no complaints.   Contractions: Not present. Vag. Bleeding: None.  Movement: Present. Denies leaking of fluid.  ----------------------------------------------------------------------------------- The following portions of the patient's history were reviewed and updated as appropriate: allergies, current medications, past family history, past medical history, past social history, past surgical history and problem list. Problem list updated.   Objective  Blood pressure 102/62, weight 247 lb (112 kg), last menstrual period 04/29/2017, not currently breastfeeding. Pregravid weight 224 lb (101.6 kg) Total Weight Gain 23 lb (10.4 kg) Urinalysis:      Fetal Status: Fetal Heart Rate (bpm): 145 Fundal Height: 28 cm Movement: Present     General:  Alert, oriented and cooperative. Patient is in no acute distress.  Skin: Skin is warm and dry. No rash noted.   Cardiovascular: Normal heart rate noted  Respiratory: Normal respiratory effort, no problems with respiration noted  Abdomen: Soft, gravid, appropriate for gestational age. Pain/Pressure: Absent     Pelvic:  Cervical exam deferred        Extremities: Normal range of motion.     ental Status: Normal mood and affect. Normal behavior. Normal judgment and thought content.   US Ob Follow  Up  Result Date: 11/16/2017 Patient Name: Kellie Davis DOB: [birth year] MRN: [id number] ULTRASOUND REPORT Location: [facility] Date of Service: 11/16/2017 Indications: Growth Findings: [name] intrauterine pregnancy is visualized with FHR at 148 BPM. Biometrics give an (U/S) Gestational age of [redacted]w[redacted]d and an (U/S) EDD of 02/06/2018; this correlates with the clinically established Estimated Date of Delivery: 02/03/18. Fetal presentation is Cephalic. Placenta: anterior. Grade: 1 AFI subjectively normal. Growth percentile is 49.9. EFW: 1,253g (2lbs 12oz) Impression: 1. [redacted]w[redacted]d Viable Singleton Intrauterine pregnancy previously established criteria. 2. Growth is 49.9 %ile.  Recommendations: 1.Clinical correlation with the patient's History and Physical Exam. Vita Barley, RDMS RVT There is a singleton gestation with normal amniotic fluid volume. The fetal biometry correlates with established dating.  Limited fetal anatomy was performed.The visualized fetal anatomical survey appears within normal limits within the resolution of ultrasound as described above.  It must be noted that a normal ultrasound is unable to rule out fetal aneuploidy.  [name], MD, [facility], [facility] 11/16/2017, 8:36 AM    Assessment   33 y.o. [id number] at [redacted]w[redacted]d by  02/03/2018, by Last Menstrual Period presenting for routine prenatal visit  Plan   Pregnancy #5 Problems (from 04/29/17 to present)    Problem Noted Resolved   Low lying placenta nos or without hemorrhage, second trimester 09/07/2017 by [name], MD No   Overview Addendum 10/05/2017  4:28 PM by [name], MD    1.24cm from cervical os at [redacted]w[redacted]d Resolved 3.06cm at 22 weeks      Supervision of high risk pregnancy, antepartum 06/11/2017 by [name], MD No   Overview Addendum 11/02/2017  4:04 PM by [name], MD    [facility] Prenatal Labs  Dating LMP=[redacted]w[redacted]d [place] Blood type: A pos  Genetic Screen NIPS:  XY Inheritest Antibody: negative  Anatomic [place] Normal (low lying placenta resolved follow up scan) Rubella: Immune Varicella: Immune  GTT Early:               Third trimester:  RPR: NR  Rhogam  HBsAg: negative  TDaP vaccine                       Flu Shot: 11/02/2017 HIV: negative  Baby Food                                GBS:   Contraception  Pap: 08/21/2014 NIL HPV negative  CBB     CS/VBAC C-section [ ]  37 weeks   Support Person Husband: [name]           Pregnancy with history of ectopic pregnancy, antepartum 04/28/2016 by Gae Dry, MD No   Overview Signed 06/11/2017 12:59 PM by [name], MD    History of cornual ectopic treat as classic C-section          Gestational age appropriate obstetric precautions including but not limited to vaginal bleeding, contractions, leaking of fluid and fetal movement were reviewed in detail with the patient.   - normal growth - 28 week labs today  Return in about 2 weeks (around 11/30/2017) for ROB.  [name], MD, [facility], [facility] 11/16/2017, 8:36 AM

## 2017-11-17 LAB — 28 WEEK RH+PANEL
BASOS: 0 %
Basophils Absolute: 0 10*3/uL (ref 0.0–0.2)
EOS (ABSOLUTE): 0.1 10*3/uL (ref 0.0–0.4)
EOS: 1 %
Gestational Diabetes Screen: 108 mg/dL (ref 65–139)
HEMATOCRIT: 33.7 % — AB (ref 34.0–46.6)
HEMOGLOBIN: 11.7 g/dL (ref 11.1–15.9)
HIV Screen 4th Generation wRfx: NONREACTIVE
IMMATURE GRANS (ABS): 0.1 10*3/uL (ref 0.0–0.1)
IMMATURE GRANULOCYTES: 1 %
LYMPHS: 17 %
Lymphocytes Absolute: 1.5 10*3/uL (ref 0.7–3.1)
MCH: 32 pg (ref 26.6–33.0)
MCHC: 34.7 g/dL (ref 31.5–35.7)
MCV: 92 fL (ref 79–97)
Monocytes Absolute: 0.6 10*3/uL (ref 0.1–0.9)
Monocytes: 7 %
NEUTROS ABS: 6.7 10*3/uL (ref 1.4–7.0)
NEUTROS PCT: 74 %
Platelets: 154 10*3/uL (ref 150–450)
RBC: 3.66 x10E6/uL — ABNORMAL LOW (ref 3.77–5.28)
RDW: 11.9 % — ABNORMAL LOW (ref 12.3–15.4)
RPR Ser Ql: NONREACTIVE
WBC: 9 10*3/uL (ref 3.4–10.8)

## 2017-11-17 LAB — THYROID PANEL WITH TSH
Free Thyroxine Index: 1.3 (ref 1.2–4.9)
T3 Uptake Ratio: 16 % — ABNORMAL LOW (ref 24–39)
T4, Total: 8.1 ug/dL (ref 4.5–12.0)
TSH: 0.591 u[IU]/mL (ref 0.450–4.500)

## 2017-11-30 ENCOUNTER — Ambulatory Visit (INDEPENDENT_AMBULATORY_CARE_PROVIDER_SITE_OTHER): Payer: Managed Care, Other (non HMO) | Admitting: Obstetrics and Gynecology

## 2017-11-30 ENCOUNTER — Encounter: Payer: Self-pay | Admitting: Obstetrics and Gynecology

## 2017-11-30 VITALS — BP 108/72 | Wt 248.0 lb

## 2017-11-30 DIAGNOSIS — O0913 Supervision of pregnancy with history of ectopic or molar pregnancy, third trimester: Secondary | ICD-10-CM

## 2017-11-30 DIAGNOSIS — O99210 Obesity complicating pregnancy, unspecified trimester: Secondary | ICD-10-CM

## 2017-11-30 DIAGNOSIS — O99213 Obesity complicating pregnancy, third trimester: Secondary | ICD-10-CM

## 2017-11-30 DIAGNOSIS — Z23 Encounter for immunization: Secondary | ICD-10-CM | POA: Diagnosis not present

## 2017-11-30 DIAGNOSIS — Z3A30 30 weeks gestation of pregnancy: Secondary | ICD-10-CM

## 2017-11-30 DIAGNOSIS — Z8639 Personal history of other endocrine, nutritional and metabolic disease: Secondary | ICD-10-CM

## 2017-11-30 DIAGNOSIS — O0910 Supervision of pregnancy with history of ectopic or molar pregnancy, unspecified trimester: Secondary | ICD-10-CM

## 2017-11-30 DIAGNOSIS — O0990 Supervision of high risk pregnancy, unspecified, unspecified trimester: Secondary | ICD-10-CM

## 2017-11-30 DIAGNOSIS — O34219 Maternal care for unspecified type scar from previous cesarean delivery: Secondary | ICD-10-CM

## 2017-11-30 LAB — POCT URINALYSIS DIPSTICK OB
GLUCOSE, UA: NEGATIVE
POC,PROTEIN,UA: NEGATIVE

## 2017-11-30 NOTE — Progress Notes (Signed)
Routine Prenatal Care Visit  Subjective  Kellie Davis is a 32 y.o. [phone] at [redacted]w[redacted]d being seen today for ongoing prenatal care.  She is currently monitored for the following issues for this high-risk pregnancy and has History of hypothyroidism; Pregnancy with history of ectopic pregnancy, antepartum; Avitaminosis D; Hypercholesterolemia without hypertriglyceridemia; Supervision of high risk pregnancy, antepartum; and Pregnancy with history of cesarean section, antepartum on their problem list.  ----------------------------------------------------------------------------------- Patient reports no complaints.   Contractions: Irregular. Vag. Bleeding: None.  Movement: Present. Denies leaking of fluid.  ----------------------------------------------------------------------------------- The following portions of the patient's history were reviewed and updated as appropriate: allergies, current medications, past family history, past medical history, past social history, past surgical history and problem list. Problem list updated.   Objective  Blood pressure 108/72, weight 248 lb (112.5 kg), last menstrual period 04/29/2017, unknown if currently breastfeeding. Pregravid weight 224 lb (101.6 kg) Total Weight Gain 24 lb (10.9 kg)  Body mass index is 42.57 kg/m.  Urinalysis:      Fetal Status: Fetal Heart Rate (bpm): 150 Fundal Height: 32 cm Movement: Present     General:  Alert, oriented and cooperative. Patient is in no acute distress.  Skin: Skin is warm and dry. No rash noted.   Cardiovascular: Normal heart rate noted  Respiratory: Normal respiratory effort, no problems with respiration noted  Abdomen: Soft, gravid, appropriate for gestational age. Pain/Pressure: Absent     Pelvic:  Cervical exam deferred        Extremities: Normal range of motion.     ental Status: Normal mood and affect. Normal behavior. Normal judgment and thought content.     Assessment   32 y.o. [id number] at  [redacted]w[redacted]d by  02/03/2018, by Last Menstrual Period presenting for routine prenatal visit  Plan   Pregnancy #5 Problems (from 04/29/17 to 11/30/17)    Problem Noted Resolved   Supervision of high risk pregnancy, antepartum 06/11/2017 by [name], MD No   Overview Addendum 11/30/2017  9:30 AM by [name], MD    [facility] Prenatal Labs  Dating LMP=[redacted]w[redacted]d [place] Blood type: A pos  Genetic Screen NIPS: XY Inheritest Antibody: negative  Anatomic [place] Normal (low lying placenta resolved follow up scan) Rubella: Immune Varicella: Immune  GTT Third trimester: 198 RPR: NR  Rhogam N/A HBsAg: negative  TDaP vaccine 11/30/17  Flu Shot: 11/02/2017 HIV: negative  Baby Food                                GBS:   Contraception  Pap: 08/21/2014 NIL HPV negative  CBB     CS/VBAC C-section [ ]  37 weeks   Support Person Husband: [name]           Pregnancy with history of ectopic pregnancy, antepartum 04/28/2016 by Gae Dry, MD No   Overview Signed 06/11/2017 12:59 PM by [name], MD    History of cornual ectopic treat as classic C-section      Low lying placenta nos or without hemorrhage, second trimester 09/07/2017 by [name], MD 11/30/2017 by [name], MD   Overview Addendum 10/05/2017  4:28 PM by [name], MD    1.24cm from cervical os at [redacted]w[redacted]d Resolved 3.06cm at [redacted] weeks          Gestational age appropriate obstetric precautions including but not limited to vaginal bleeding, contractions, leaking of fluid and fetal movement were reviewed in detail with  the patient.    Return in about 2 weeks (around 12/14/2017) for ROB and growth scan.  [name], MD, [facility], [facility] 11/30/2017, 9:31 AM

## 2017-11-30 NOTE — Progress Notes (Signed)
ROB TDAP today Braxton Hicks/low back pain

## 2017-12-16 ENCOUNTER — Other Ambulatory Visit: Payer: Managed Care, Other (non HMO)

## 2017-12-16 ENCOUNTER — Ambulatory Visit (INDEPENDENT_AMBULATORY_CARE_PROVIDER_SITE_OTHER): Payer: Managed Care, Other (non HMO)

## 2017-12-16 ENCOUNTER — Ambulatory Visit (INDEPENDENT_AMBULATORY_CARE_PROVIDER_SITE_OTHER): Payer: Managed Care, Other (non HMO) | Admitting: Obstetrics and Gynecology

## 2017-12-16 ENCOUNTER — Encounter: Payer: Self-pay | Admitting: Obstetrics and Gynecology

## 2017-12-16 VITALS — BP 122/86 | Wt 252.0 lb

## 2017-12-16 DIAGNOSIS — O0913 Supervision of pregnancy with history of ectopic or molar pregnancy, third trimester: Secondary | ICD-10-CM

## 2017-12-16 DIAGNOSIS — Z362 Encounter for other antenatal screening follow-up: Secondary | ICD-10-CM | POA: Diagnosis not present

## 2017-12-16 DIAGNOSIS — Z3A33 33 weeks gestation of pregnancy: Secondary | ICD-10-CM

## 2017-12-16 DIAGNOSIS — O0990 Supervision of high risk pregnancy, unspecified, unspecified trimester: Secondary | ICD-10-CM

## 2017-12-16 DIAGNOSIS — O0910 Supervision of pregnancy with history of ectopic or molar pregnancy, unspecified trimester: Secondary | ICD-10-CM

## 2017-12-16 DIAGNOSIS — O99210 Obesity complicating pregnancy, unspecified trimester: Secondary | ICD-10-CM

## 2017-12-16 LAB — POCT URINALYSIS DIPSTICK OB
GLUCOSE, UA: NEGATIVE
POC,PROTEIN,UA: NEGATIVE

## 2017-12-16 NOTE — Progress Notes (Signed)
ROB Ultrasound 

## 2017-12-16 NOTE — Progress Notes (Signed)
Routine Prenatal Care Visit  Subjective  NICOLAS SISLER is a 32 y.o. [phone] at [redacted]w[redacted]d being seen today for ongoing prenatal care.  She is currently monitored for the following issues for this high-risk pregnancy and has History of hypothyroidism; Pregnancy with history of ectopic pregnancy, antepartum; Avitaminosis D; Hypercholesterolemia without hypertriglyceridemia; Supervision of high risk pregnancy, antepartum; and Pregnancy with history of cesarean section, antepartum on their problem list.  ----------------------------------------------------------------------------------- Patient reports no complaints.   Contractions: Irregular. Vag. Bleeding: None.  Movement: Present. Denies leaking of fluid.  ----------------------------------------------------------------------------------- The following portions of the patient's history were reviewed and updated as appropriate: allergies, current medications, past family history, past medical history, past social history, past surgical history and problem list. Problem list updated.   Objective  Blood pressure 122/86, weight 252 lb (114.3 kg), last menstrual period 04/29/2017, unknown if currently breastfeeding. Pregravid weight 224 lb (101.6 kg) Total Weight Gain 28 lb (12.7 kg) Urinalysis:      Fetal Status: Fetal Heart Rate (bpm): 145 Fundal Height: 34 cm Movement: Present     General:  Alert, oriented and cooperative. Patient is in no acute distress.  Skin: Skin is warm and dry. No rash noted.   Cardiovascular: Normal heart rate noted  Respiratory: Normal respiratory effort, no problems with respiration noted  Abdomen: Soft, gravid, appropriate for gestational age. Pain/Pressure: Absent     Pelvic:  Cervical exam deferred        Extremities: Normal range of motion.     ental Status: Normal mood and affect. Normal behavior. Normal judgment and thought content.    US Ob Follow Up  Result Date: 12/17/2017 Patient Name: MARYSUE FAIT DOB: 06-24-1985 MRN: [id number] ULTRASOUND REPORT Location: [facility] Date of Service: 12/16/2017 135 Indications:growth/afi Findings: [name] intrauterine pregnancy is visualized with FHR at 135 BPM. Biometrics give an (U/S) Gestational age of [redacted]w[redacted]d and an (U/S) EDD of 01/31/18; this correlates with the clinically established Estimated Date of Delivery: 02/03/18 Fetal presentation is Cephalic. Placenta: anterior. Grade: 1 AFI: 12.13 cm Growth percentile is 62.8%. EFW: 5lb/3oz/2347g (document both grams and pounds) Impression: 1. [redacted]w[redacted]d Viable Singleton Intrauterine pregnancy previously established criteria.EDD 02/03/18 2. Growth is 62.8 %ile.  AFI is 12.13 cm. Recommendations: 1.Clinical correlation with the patient's History and Physical Exam. Abeer Alsammarraie,RDMS There is a singleton gestation with normal amniotic fluid volume. The fetal biometry correlates with established dating.  Limited fetal anatomy was performed.The visualized fetal anatomical survey appears within normal limits within the resolution of ultrasound as described above.  It must be noted that a normal ultrasound is unable to rule out fetal aneuploidy.  [name], MD, [facility], [facility]    Assessment   32 y.o. [phone] at [redacted]w[redacted]d by  02/03/2018, by Last Menstrual Period presenting for routine prenatal visit  Plan   Pregnancy #5 Problems (from 04/29/17 to 11/30/17)    Problem Noted Resolved   Supervision of high risk pregnancy, antepartum 06/11/2017 by [name], MD No   Overview Addendum 11/30/2017  9:30 AM by [name], MD    [facility] Prenatal Labs  Dating LMP=[redacted]w[redacted]d [place] Blood type: A pos  Genetic Screen NIPS: XY Inheritest Antibody: negative  Anatomic [place] Normal (low lying placenta resolved follow up scan) Rubella: Immune Varicella: Immune  GTT Third trimester: 198 RPR: NR  Rhogam N/A HBsAg: negative  TDaP vaccine 11/30/17  Flu Shot: 11/02/2017 HIV: negative    Baby Food  GBS:   Contraception  Pap: 08/21/2014 NIL HPV negative  CBB     CS/VBAC C-section [ ]  37 weeks   Support Person Husband: [name]           Pregnancy with history of ectopic pregnancy, antepartum 04/28/2016 by Gae Dry, MD No   Overview Signed 06/11/2017 12:59 PM by [name], MD    History of cornual ectopic treat as classic C-section      Low lying placenta nos or without hemorrhage, second trimester 09/07/2017 by [name], MD 11/30/2017 by [name], MD   Overview Addendum 10/05/2017  4:28 PM by [name], MD    1.24cm from cervical os at [redacted]w[redacted]d Resolved 3.06cm at [redacted] weeks          Gestational age appropriate obstetric precautions including but not limited to vaginal bleeding, contractions, leaking of fluid and fetal movement were reviewed in detail with the patient.   - see about moving C-section up to 37 weeks  Return in about 2 weeks (around 12/30/2017) for ROB.  [name], MD, [facility], [facility] 12/16/2017, 4:15 PM

## 2017-12-27 ENCOUNTER — Other Ambulatory Visit: Payer: Self-pay | Admitting: Obstetrics and Gynecology

## 2017-12-27 NOTE — Telephone Encounter (Signed)
advise

## 2017-12-28 ENCOUNTER — Ambulatory Visit (INDEPENDENT_AMBULATORY_CARE_PROVIDER_SITE_OTHER): Payer: Managed Care, Other (non HMO) | Admitting: Obstetrics and Gynecology

## 2017-12-28 VITALS — BP 112/56 | Wt 254.0 lb

## 2017-12-28 DIAGNOSIS — O0910 Supervision of pregnancy with history of ectopic or molar pregnancy, unspecified trimester: Secondary | ICD-10-CM

## 2017-12-28 DIAGNOSIS — O0990 Supervision of high risk pregnancy, unspecified, unspecified trimester: Secondary | ICD-10-CM

## 2017-12-28 DIAGNOSIS — O0913 Supervision of pregnancy with history of ectopic or molar pregnancy, third trimester: Secondary | ICD-10-CM

## 2017-12-28 DIAGNOSIS — Z3A34 34 weeks gestation of pregnancy: Secondary | ICD-10-CM

## 2017-12-28 LAB — POCT URINALYSIS DIPSTICK OB
Glucose, UA: NEGATIVE
POC,PROTEIN,UA: NEGATIVE

## 2017-12-28 NOTE — Progress Notes (Signed)
Routine Prenatal Care Visit  Subjective  Kellie Davis is a 32 y.o. [phone] at [redacted]w[redacted]d being seen today for ongoing prenatal care.  She is currently monitored for the following issues for this high-risk pregnancy and has History of hypothyroidism; Pregnancy with history of ectopic pregnancy, antepartum; Avitaminosis D; Hypercholesterolemia without hypertriglyceridemia; Supervision of high risk pregnancy, antepartum; and Pregnancy with history of cesarean section, antepartum on their problem list.  ----------------------------------------------------------------------------------- Patient reports no complaints.   Contractions: Irregular. Vag. Bleeding: None.  Movement: Present. Denies leaking of fluid.  ----------------------------------------------------------------------------------- The following portions of the patient's history were reviewed and updated as appropriate: allergies, current medications, past family history, past medical history, past social history, past surgical history and problem list. Problem list updated.   Objective  Last menstrual period 04/29/2017, unknown if currently breastfeeding. Pregravid weight 224 lb (101.6 kg) Total Weight Gain 30 lb (13.6 kg) Urinalysis:      Fetal Status: Fetal Heart Rate (bpm): 145   Movement: Present     General:  Alert, oriented and cooperative. Patient is in no acute distress.  Skin: Skin is warm and dry. No rash noted.   Cardiovascular: Normal heart rate noted  Respiratory: Normal respiratory effort, no problems with respiration noted  Abdomen: Soft, gravid, appropriate for gestational age. Pain/Pressure: Absent     Pelvic:  Cervical exam deferred        Extremities: Normal range of motion.     ental Status: Normal mood and affect. Normal behavior. Normal judgment and thought content.     Assessment   32 y.o. [id number] at [redacted]w[redacted]d by  02/03/2018, by Last Menstrual Period presenting for routine prenatal visit  Plan    Pregnancy #5 Problems (from 04/29/17 to present)    Problem Noted Resolved   Supervision of high risk pregnancy, antepartum 06/11/2017 by [name], MD No   Overview Addendum 11/30/2017  9:30 AM by [name], MD    [facility] Prenatal Labs  Dating LMP=[redacted]w[redacted]d [place] Blood type: A pos  Genetic Screen NIPS: XY Inheritest Antibody: negative  Anatomic [place] Normal (low lying placenta resolved follow up scan) Rubella: Immune Varicella: Immune  GTT Third trimester: 198 RPR: NR  Rhogam N/A HBsAg: negative  TDaP vaccine 11/30/17  Flu Shot: 11/02/2017 HIV: negative  Baby Food                                GBS:   Contraception  Pap: 08/21/2014 NIL HPV negative  CBB     CS/VBAC C-section [ ]  37 weeks - 01/16/18   Support Person Husband: [name]           Pregnancy with history of ectopic pregnancy, antepartum 04/28/2016 by Gae Dry, MD No   Overview Signed 06/11/2017 12:59 PM by [name], MD    History of cornual ectopic treat as classic C-section      Low lying placenta nos or without hemorrhage, second trimester 09/07/2017 by [name], MD 11/30/2017 by [name], MD   Overview Addendum 10/05/2017  4:28 PM by [name], MD    1.24cm from cervical os at [redacted]w[redacted]d Resolved 3.06cm at [redacted] weeks          Gestational age appropriate obstetric precautions including but not limited to vaginal bleeding, contractions, leaking of fluid and fetal movement were reviewed in detail with the patient.   - C-section moved up to 01/16/18  Return in about 1 week (  around 01/04/2018) for [place].  [name], MD, [facility], [facility] 12/28/2017, 11:49 AM

## 2017-12-28 NOTE — Progress Notes (Signed)
ROB Swelling in hands/Feet

## 2018-01-04 ENCOUNTER — Ambulatory Visit (INDEPENDENT_AMBULATORY_CARE_PROVIDER_SITE_OTHER): Payer: Managed Care, Other (non HMO) | Admitting: Obstetrics and Gynecology

## 2018-01-04 VITALS — BP 111/78 | Wt 255.0 lb

## 2018-01-04 DIAGNOSIS — O0910 Supervision of pregnancy with history of ectopic or molar pregnancy, unspecified trimester: Secondary | ICD-10-CM

## 2018-01-04 DIAGNOSIS — O99213 Obesity complicating pregnancy, third trimester: Secondary | ICD-10-CM

## 2018-01-04 DIAGNOSIS — O0913 Supervision of pregnancy with history of ectopic or molar pregnancy, third trimester: Secondary | ICD-10-CM

## 2018-01-04 DIAGNOSIS — Z3A36 36 weeks gestation of pregnancy: Secondary | ICD-10-CM

## 2018-01-04 DIAGNOSIS — Z3685 Encounter for antenatal screening for Streptococcus B: Secondary | ICD-10-CM

## 2018-01-04 DIAGNOSIS — O98813 Other maternal infectious and parasitic diseases complicating pregnancy, third trimester: Secondary | ICD-10-CM

## 2018-01-04 DIAGNOSIS — O0990 Supervision of high risk pregnancy, unspecified, unspecified trimester: Secondary | ICD-10-CM

## 2018-01-04 DIAGNOSIS — O34219 Maternal care for unspecified type scar from previous cesarean delivery: Secondary | ICD-10-CM

## 2018-01-04 DIAGNOSIS — Z8639 Personal history of other endocrine, nutritional and metabolic disease: Secondary | ICD-10-CM

## 2018-01-04 DIAGNOSIS — B379 Candidiasis, unspecified: Secondary | ICD-10-CM | POA: Diagnosis not present

## 2018-01-04 DIAGNOSIS — O99210 Obesity complicating pregnancy, unspecified trimester: Secondary | ICD-10-CM | POA: Insufficient documentation

## 2018-01-04 LAB — POCT URINALYSIS DIPSTICK OB
Glucose, UA: NEGATIVE
POC,PROTEIN,UA: NEGATIVE

## 2018-01-04 MED ORDER — FLUCONAZOLE 150 MG PO TABS: 150.0000 mg | ORAL_TABLET | Freq: Once | ORAL | 0 refills | Status: AC

## 2018-01-04 NOTE — Progress Notes (Signed)
ROB Yeast infection contractions

## 2018-01-04 NOTE — Progress Notes (Signed)
Routine Prenatal Care Visit  Subjective  Kellie Davis is a 32 y.o. [phone] at [redacted]w[redacted]d being seen today for ongoing prenatal care.  She is currently monitored for the following issues for this high-risk pregnancy and has History of hypothyroidism; Pregnancy with history of ectopic pregnancy, antepartum; Avitaminosis D; Hypercholesterolemia without hypertriglyceridemia; Supervision of high risk pregnancy, antepartum; Pregnancy with history of cesarean section, antepartum; and Maternal obesity, antepartum on their problem list.  ----------------------------------------------------------------------------------- Patient reports yeast infection.   Contractions: Irregular. Vag. Bleeding: None.  Movement: Present. Denies leaking of fluid.  ----------------------------------------------------------------------------------- The following portions of the patient's history were reviewed and updated as appropriate: allergies, current medications, past family history, past medical history, past social history, past surgical history and problem list. Problem list updated.   Objective  Blood pressure 111/78, weight 255 lb (115.7 kg), last menstrual period 04/29/2017, unknown if currently breastfeeding. Pregravid weight 224 lb (101.6 kg) Total Weight Gain 31 lb (14.1 kg)  Body mass index is 43.77 kg/m.  Urinalysis:      Fetal Status: Fetal Heart Rate (bpm): 145 Fundal Height: 36 cm Movement: Present  Presentation: Vertex  General:  Alert, oriented and cooperative. Patient is in no acute distress.  Skin: Skin is warm and dry. No rash noted.   Cardiovascular: Normal heart rate noted  Respiratory: Normal respiratory effort, no problems with respiration noted  Abdomen: Soft, gravid, appropriate for gestational age. Pain/Pressure: Absent     Pelvic:  Cervical exam performed Dilation: Closed      Extremities: Normal range of motion.     ental Status: Normal mood and affect. Normal behavior. Normal  judgment and thought content.   Wet Prep: Clue Cells: Negative Fungal elements: Positive Trichomonas: Negative   Assessment   32 y.o. [id number] at [redacted]w[redacted]d by  02/03/2018, by Last Menstrual Period presenting for routine prenatal visit  Plan   Pregnancy #5 Problems (from 04/29/17 to present)    Problem Noted Resolved   Supervision of high risk pregnancy, antepartum 06/11/2017 by [name], MD No   Overview Addendum 12/28/2017 11:47 AM by [name], MD    Clinic [facility] Prenatal Labs  Dating LMP=[redacted]w[redacted]d [place] Blood type: A pos  Genetic Screen NIPS: XY Inheritest Antibody: negative  Anatomic [place] Normal (low lying placenta resolved follow up scan) Rubella: Immune Varicella: Immune  GTT Third trimester: 198 RPR: NR  Rhogam N/A HBsAg: negative  TDaP vaccine 11/30/17  Flu Shot: 11/02/2017 HIV: negative  Baby Food                                GBS:   Contraception  Pap: 08/21/2014 NIL HPV negative  CBB     CS/VBAC C-section [X]  37 weeks 01/16/18   Support Person Husband: [name]           Pregnancy with history of ectopic pregnancy, antepartum 04/28/2016 by Gae Dry, MD No   Overview Signed 06/11/2017 12:59 PM by [name], MD    History of cornual ectopic treat as classic C-section      Low lying placenta nos or without hemorrhage, second trimester 09/07/2017 by [name], MD 11/30/2017 by [name], MD   Overview Addendum 10/05/2017  4:28 PM by [name], MD    1.24cm from cervical os at [redacted]w[redacted]d Resolved 3.06cm at [redacted] weeks          Gestational age appropriate obstetric precautions including but not limited to vaginal bleeding,  contractions, leaking of fluid and fetal movement were reviewed in detail with the patient.   - 36 week growth next visit - Diflucan for candida - GBS collected  Return in about 1 week (around 01/11/2018) for ROB.  [name], MD, [facility], [facility] 01/04/2018, 9:25  AM

## 2018-01-06 LAB — STREP GP B NAA: Strep Gp B NAA: NEGATIVE

## 2018-01-09 ENCOUNTER — Ambulatory Visit (INDEPENDENT_AMBULATORY_CARE_PROVIDER_SITE_OTHER): Payer: Managed Care, Other (non HMO)

## 2018-01-09 ENCOUNTER — Ambulatory Visit (INDEPENDENT_AMBULATORY_CARE_PROVIDER_SITE_OTHER): Payer: Managed Care, Other (non HMO) | Admitting: Obstetrics and Gynecology

## 2018-01-09 VITALS — BP 116/82 | Wt 253.0 lb

## 2018-01-09 DIAGNOSIS — O0913 Supervision of pregnancy with history of ectopic or molar pregnancy, third trimester: Secondary | ICD-10-CM

## 2018-01-09 DIAGNOSIS — O99210 Obesity complicating pregnancy, unspecified trimester: Secondary | ICD-10-CM

## 2018-01-09 DIAGNOSIS — O0910 Supervision of pregnancy with history of ectopic or molar pregnancy, unspecified trimester: Secondary | ICD-10-CM

## 2018-01-09 DIAGNOSIS — Z3A36 36 weeks gestation of pregnancy: Secondary | ICD-10-CM

## 2018-01-09 DIAGNOSIS — O34219 Maternal care for unspecified type scar from previous cesarean delivery: Secondary | ICD-10-CM

## 2018-01-09 DIAGNOSIS — O0990 Supervision of high risk pregnancy, unspecified, unspecified trimester: Secondary | ICD-10-CM

## 2018-01-09 DIAGNOSIS — O99213 Obesity complicating pregnancy, third trimester: Secondary | ICD-10-CM | POA: Diagnosis not present

## 2018-01-09 DIAGNOSIS — O34211 Maternal care for low transverse scar from previous cesarean delivery: Secondary | ICD-10-CM

## 2018-01-09 DIAGNOSIS — Z8639 Personal history of other endocrine, nutritional and metabolic disease: Secondary | ICD-10-CM

## 2018-01-09 LAB — POCT URINALYSIS DIPSTICK OB: Glucose, UA: NEGATIVE

## 2018-01-09 NOTE — Progress Notes (Signed)
ROB Ultrasound 

## 2018-01-09 NOTE — Progress Notes (Signed)
Routine Prenatal Care Visit  Subjective  Kellie Davis is a 32 y.o. [phone] at [redacted]w[redacted]d being seen today for ongoing prenatal care.  She is currently monitored for the following issues for this high-risk pregnancy and has History of hypothyroidism; Pregnancy with history of ectopic pregnancy, antepartum; Avitaminosis D; Hypercholesterolemia without hypertriglyceridemia; Supervision of high risk pregnancy, antepartum; Pregnancy with history of cesarean section, antepartum; and Maternal obesity, antepartum on their problem list.  ----------------------------------------------------------------------------------- Patient reports no complaints.    .  .   . Denies leaking of fluid.  ----------------------------------------------------------------------------------- The following portions of the patient's history were reviewed and updated as appropriate: allergies, current medications, past family history, past medical history, past social history, past surgical history and problem list. Problem list updated.   Objective  Blood pressure 116/82, weight 253 lb (114.8 kg), last menstrual period 04/29/2017, unknown if currently breastfeeding. Pregravid weight 224 lb (101.6 kg) Total Weight Gain 29 lb (13.2 kg) Urinalysis:      Fetal Status:           General:  Alert, oriented and cooperative. Patient is in no acute distress.  Skin: Skin is warm and dry. No rash noted.   Cardiovascular: Normal heart rate noted  Respiratory: Normal respiratory effort, no problems with respiration noted  Abdomen: Soft, gravid, appropriate for gestational age.       Pelvic:  Cervical exam deferred        Extremities: Normal range of motion.     ental Status: Normal mood and affect. Normal behavior. Normal judgment and thought content.   US Ob Follow Up  Result Date: 01/09/2018 Patient Name: Kellie Davis DOB: 1985-04-20 MRN: [id number] ULTRASOUND REPORT Location: [facility] Date of Service: 01/09/2018  Indications:growth/afi Findings: [name] intrauterine pregnancy is visualized with FHR at 136 BPM. Biometrics give an (U/S) Gestational age of [redacted]w[redacted]d and an (U/S) EDD of 02/10/2018; this correlates with the clinically established Estimated Date of Delivery: 02/03/18. Fetal presentation is Cephalic. Placenta: anterior. Grade: 2 AFI: 6.6 cm Growth percentile is 37.3 . EFW: 2,871g (6lbs 5oz) Impression: 1. [redacted]w[redacted]d Viable Singleton Intrauterine pregnancy previously established criteria. 2. Growth is 37.3 %ile.  AFI is 6.6 cm. Recommendations: 1.Clinical correlation with the patient's History and Physical Exam. Vita Barley, RDMS RVT There is a singleton gestation with normal amniotic fluid volume. The fetal biometry correlates with established dating.  Limited fetal anatomy was performed.The visualized fetal anatomical survey appears within normal limits within the resolution of ultrasound as described above.  It must be noted that a normal ultrasound is unable to rule out fetal aneuploidy.  [name], MD, [facility], [facility] 01/09/2018, 3:22 PM   US Ob Follow Up  Result Date: 12/17/2017 Patient Name: Kellie Davis DOB: October 03, 1985 MRN: [id number] ULTRASOUND REPORT Location: [facility] Date of Service: 12/16/2017 135 Indications:growth/afi Findings: [name] intrauterine pregnancy is visualized with FHR at 135 BPM. Biometrics give an (U/S) Gestational age of [redacted]w[redacted]d and an (U/S) EDD of 01/31/18; this correlates with the clinically established Estimated Date of Delivery: 02/03/18 Fetal presentation is Cephalic. Placenta: anterior. Grade: 1 AFI: 12.13 cm Growth percentile is 62.8%. EFW: 5lb/3oz/2347g (document both grams and pounds) Impression: 1. [redacted]w[redacted]d Viable Singleton Intrauterine pregnancy previously established criteria.EDD 02/03/18 2. Growth is 62.8 %ile.  AFI is 12.13 cm. Recommendations: 1.Clinical correlation with the patient's History and Physical Exam. Abeer  Alsammarraie,RDMS There is a singleton gestation with normal amniotic fluid volume. The fetal biometry correlates with established dating.  Limited fetal anatomy was performed.The visualized fetal anatomical survey appears within normal limits within the resolution of ultrasound as described above.  It must be noted that a normal ultrasound is unable to rule out fetal aneuploidy.  [name], MD, [facility], [facility]     Assessment   32 y.o. [phone] at [redacted]w[redacted]d by  02/03/2018, by Last Menstrual Period presenting for routine prenatal visit  Plan   Pregnancy #5 Problems (from 04/29/17 to present)    Problem Noted Resolved   Supervision of high risk pregnancy, antepartum 06/11/2017 by [name], MD No   Overview Addendum 12/28/2017 11:47 AM by [name], MD    [facility] Prenatal Labs  Dating LMP=[redacted]w[redacted]d [place] Blood type: A pos  Genetic Screen NIPS: XY Inheritest Antibody: negative  Anatomic [place] Normal (low lying placenta resolved follow up scan) Rubella: Immune Varicella: Immune  GTT Third trimester: 198 RPR: NR  Rhogam N/A HBsAg: negative  TDaP vaccine 11/30/17  Flu Shot: 11/02/2017 HIV: negative  Baby Food                                GBS:   Contraception  Pap: 08/21/2014 NIL HPV negative  CBB     CS/VBAC C-section [X]  37 weeks 01/16/18   Support Person Husband: [name]           Pregnancy with history of ectopic pregnancy, antepartum 04/28/2016 by Gae Dry, MD No   Overview Signed 06/11/2017 12:59 PM by [name], MD    History of cornual ectopic treat as classic C-section      Low lying placenta nos or without hemorrhage, second trimester 09/07/2017 by [name], MD 11/30/2017 by [name], MD   Overview Addendum 10/05/2017  4:28 PM by [name], MD    1.24cm from cervical os at [redacted]w[redacted]d Resolved 3.06cm at [redacted] weeks          Gestational age appropriate obstetric precautions including but not  limited to vaginal bleeding, contractions, leaking of fluid and fetal movement were reviewed in detail with the patient.    Return in about 13 months (around 01/26/2019) for preop.  [name], MD, [facility], [facility] 01/09/2018, 3:43 PM

## 2018-01-12 NOTE — Progress Notes (Signed)
Obstetric H&P   Chief Complaint: H&P Cesarean  Prenatal Care Provider: [facility]  History of Present Illness: 32 y.o. [id number] [redacted]w[redacted]d by 02/03/2018, by Last Menstrual Period presenting for ROB and H&P for scheduled cesarean section secondary to prior uterine surgery involving the contractile portion of the uterus (cornual resection for cornual ectopic).  No LOF, no VB, irregular contractions, no LOF.  Pregnancy has otherwise been uncomplicated.   Woke up this morning with acute onset RUQ pain.  She has not had any similar pain this pregnancy but did last pregnancy.  This is the side of her cornual resection.  She is unable to correlate it with food intake, had had some nausea, no emesis.  No associated with change in stool.  No dysuria.  No fevers, no chills   Pregravid weight 224 lb (101.6 kg) Total Weight Gain 29 lb (13.2 kg)  Pregnancy #5 Problems (from 04/29/17 to present)    Problem Noted Resolved   Supervision of high risk pregnancy, antepartum 06/11/2017 by [name], MD No   Overview Addendum 12/28/2017 11:47 AM by [name], MD    [facility] Prenatal Labs  Dating LMP=[redacted]w[redacted]d [place] Blood type: A pos  Genetic Screen NIPS: XY Inheritest Antibody: negative  Anatomic [place] Normal (low lying placenta resolved follow up scan) Rubella: Immune Varicella: Immune  GTT Third trimester: 108 RPR: NR  Rhogam N/A HBsAg: negative  TDaP vaccine 11/30/17  Flu Shot: 11/02/2017 HIV: negative  Baby Food                                GBS: negative  Contraception  Pap: 08/21/2014 NIL HPV negative  CBB     CS/VBAC C-section [X]  37 weeks 01/16/18   Support Person Husband: [name]           Pregnancy with history of ectopic pregnancy, antepartum 04/28/2016 by Gae Dry, MD No   Overview Signed 06/11/2017 12:59 PM by [name], MD    History of cornual ectopic treat as classic C-section      Low lying placenta nos or without hemorrhage, second trimester 09/07/2017 by [name], MD 11/30/2017 by [name], MD   Overview Addendum 10/05/2017  4:28 PM by [name], MD    1.24cm from cervical os at [redacted]w[redacted]d Resolved 3.06cm at 22 weeks          Review of Systems: 10 point review of systems negative unless otherwise noted in HPI  Past Medical History: Past Medical History:  Diagnosis Date  . Anxiety disorder, unspecified    Situational  . Hypothyroidism     Past Surgical History: Past Surgical History:  Procedure Laterality Date  . BREAST ENHANCEMENT SURGERY  2014  . CESAREAN SECTION N/A 06/01/2016   Procedure: PRIMARY LOW TRANSVERSE CESAREAN SECTION;  Surgeon: [name], MD;  Location: [facility] ORS;  Service: Obstetrics;  Laterality: N/A;  . COLPOSCOPY  02/25/2012  . LAPAROSCOPIC SALPINGO OOPHERECTOMY Right 01/06/2015   Procedure: LAPAROSCOPIC right SALPINGECTOMYwith removal of ectopic and partial  right corneal resection ;  no oophorectomySurgeon: [name], MD;  Location: [facility] ORS;  Service: Gynecology;  Laterality: Right;  . TONSILLECTOMY  2014    Past Obstetric History: #: 1, Date: 2005, Sex: Female, Weight: 7 lb 11 oz (3.487 kg), GA: None, Delivery: Vaginal, Spontaneous, Apgar1: None, Apgar5: None, Living: Living, Birth Comments: None  #: 2, Date: 11/29/14, Sex: None, Weight: None, GA: None, Delivery: Ectopic, Apgar1: None,  Apgar5: None, Living: None, Birth Comments: None  #: 3, Date: 06/01/16, Sex: Female, Weight: 7 lb 6 oz (3.345 kg), GA: [redacted]w[redacted]d, Delivery: C-Section, Low Transverse, Apgar1: 8, Apgar5: 9, Living: Living, Birth Comments: None  #: 4, Date: None, Sex: None, Weight: None, GA: None, Delivery: None, Apgar1: None, Apgar5: None, Living: None, Birth Comments: None   Past Gynecologic History:  Family History: Family History  Problem Relation Age of Onset  . Heart disease Mother   . Diabetes Mother   . Hyperlipidemia Mother     Social History: Social History   Socioeconomic History  . Marital status:  Married    Spouse name: Not on file  . Number of children: Not on file  . Years of education: Not on file  . Highest education level: Not on file  Occupational History  . Not on file  Social Needs  . Financial resource strain: Not on file  . Food insecurity:    Worry: Not on file    Inability: Not on file  . Transportation needs:    Medical: Not on file    Non-medical: Not on file  Tobacco Use  . Smoking status: Never Smoker  . Smokeless tobacco: Never Used  Substance and Sexual Activity  . Alcohol use: No    Comment: occas  . Drug use: No  . Sexual activity: Yes    Birth control/protection: Pill  Lifestyle  . Physical activity:    Days per week: 5 days    Minutes per session: 50 min  . Stress: Not at all  Relationships  . Social connections:    Talks on phone: More than three times a week    Gets together: More than three times a week    Attends religious service: More than 4 times per year    Active member of club or organization: Yes    Attends meetings of clubs or organizations: More than 4 times per year    Relationship status: Married  . Intimate partner violence:    Fear of current or ex partner: No    Emotionally abused: No    Physically abused: No    Forced sexual activity: No  Other Topics Concern  . Not on file  Social History Narrative  . Not on file    Medications: Prior to Admission medications   Medication Sig Start Date End Date Taking? Authorizing Provider  Magnesium 250 MG TABS Take 250 mg by mouth 2 (two) times daily.     [provider]  Prenatal Multivit-Min-Fe-FA (PRE-NATAL PO) Take 1 tablet by mouth daily.     [provider]    Allergies: Allergies  Allergen Reactions  . Codeine Anaphylaxis    Physical Exam: Vitals: Blood pressure 122/82, pulse 95, height 5\' 4"  (1.626 m), weight 256 lb (116.1 kg), last menstrual period 04/29/2017, unknown if currently breastfeeding.   FHT: 145  General: In visible  discomfort HEENT: normocephalic, anicteric Pulmonary: CTAB, No increased work of breathing Cardiovascular: RRR, distal pulses 2+ Abdomen: Gravid, non-tender Leopolds:vtx Genitourinary: deferred Extremities: no edema, erythema, or tenderness Neurologic: Grossly intact Psychiatric: mood appropriate, affect full  Labs: No results found for this or any previous visit (from the past 24 hour(s)).  Assessment: 32 y.o. [id number] [redacted]w[redacted]d by 02/03/2018, by Last Menstrual Period presenting for ROB and preop visit  Plan: 1) The patient was counseled regarding risk and benefits to proceeding with Cesarean section to expedite delivery.  Risk of cesarean section were discussed including risk of bleeding  and need for potential intraoperative or postoperative blood transfusion with a rate of approximately 5% quoted for all Cesarean sections, risk of injury to adjacent organs including but not limited to bowl and bladder, the need for additional surgical procedures to address such injuries, and the risk of infection.    2) Fetus - +FHT  3) PNL - Blood type A/Positive/-- (06/11 1622) / Anti-bodyscreen Negative (06/11 1622) / Rubella 8.31 (06/11 1622) / Varicella Immune / RPR Non Reactive (10/16 0919) / HBsAg Negative (06/11 1622) / HIV Non Reactive (10/16 0919) / 1-hr OGTT 108 / GBS Negative (12/04 0925)  4) Immunization History -  Immunization History  Administered Date(s) Administered  . Influenza-Unspecified 11/15/2016, 11/02/2017  . Tdap 04/19/2016, 11/30/2017    5) RUQ pain - to L&D given severity and acute onset. DDx includes cholelithiasis, cholecystitis, dehiscence of cornual resection  7) Disposition - proceed with RLTC 01/16/18  [name], MD, [place], [facility] 01/12/2018, 8:33 PM

## 2018-01-13 ENCOUNTER — Observation Stay: Payer: Managed Care, Other (non HMO)

## 2018-01-13 ENCOUNTER — Ambulatory Visit (INDEPENDENT_AMBULATORY_CARE_PROVIDER_SITE_OTHER): Payer: Managed Care, Other (non HMO) | Admitting: Obstetrics and Gynecology

## 2018-01-13 ENCOUNTER — Inpatient Hospital Stay: Admission: RE | Admit: 2018-01-13 | Payer: Managed Care, Other (non HMO) | Source: Ambulatory Visit

## 2018-01-13 ENCOUNTER — Observation Stay
Admission: EM | Admit: 2018-01-13 | Discharge: 2018-01-13 | Disposition: A | Discharge status: Home / Self Care | Payer: Managed Care, Other (non HMO) | Source: Home / Self Care | Admitting: Obstetrics and Gynecology

## 2018-01-13 ENCOUNTER — Encounter: Payer: Self-pay | Admitting: Obstetrics and Gynecology

## 2018-01-13 ENCOUNTER — Other Ambulatory Visit: Payer: Self-pay

## 2018-01-13 VITALS — BP 122/82 | HR 95 | Ht 64.0 in | Wt 256.0 lb

## 2018-01-13 DIAGNOSIS — Z9079 Acquired absence of other genital organ(s): Secondary | ICD-10-CM

## 2018-01-13 DIAGNOSIS — O0910 Supervision of pregnancy with history of ectopic or molar pregnancy, unspecified trimester: Secondary | ICD-10-CM

## 2018-01-13 DIAGNOSIS — Z3A36 36 weeks gestation of pregnancy: Secondary | ICD-10-CM

## 2018-01-13 DIAGNOSIS — O0990 Supervision of high risk pregnancy, unspecified, unspecified trimester: Secondary | ICD-10-CM

## 2018-01-13 DIAGNOSIS — Z3A37 37 weeks gestation of pregnancy: Secondary | ICD-10-CM

## 2018-01-13 DIAGNOSIS — O99210 Obesity complicating pregnancy, unspecified trimester: Secondary | ICD-10-CM

## 2018-01-13 DIAGNOSIS — O34219 Maternal care for unspecified type scar from previous cesarean delivery: Secondary | ICD-10-CM

## 2018-01-13 DIAGNOSIS — O9081 Anemia of the puerperium: Secondary | ICD-10-CM | POA: Diagnosis not present

## 2018-01-13 DIAGNOSIS — R109 Unspecified abdominal pain: Secondary | ICD-10-CM

## 2018-01-13 DIAGNOSIS — O0993 Supervision of high risk pregnancy, unspecified, third trimester: Secondary | ICD-10-CM

## 2018-01-13 DIAGNOSIS — O26893 Other specified pregnancy related conditions, third trimester: Secondary | ICD-10-CM | POA: Insufficient documentation

## 2018-01-13 DIAGNOSIS — O34211 Maternal care for low transverse scar from previous cesarean delivery: Principal | ICD-10-CM | POA: Diagnosis present

## 2018-01-13 DIAGNOSIS — O0913 Supervision of pregnancy with history of ectopic or molar pregnancy, third trimester: Secondary | ICD-10-CM

## 2018-01-13 DIAGNOSIS — D62 Acute posthemorrhagic anemia: Secondary | ICD-10-CM | POA: Diagnosis not present

## 2018-01-13 DIAGNOSIS — R1011 Right upper quadrant pain: Secondary | ICD-10-CM

## 2018-01-13 DIAGNOSIS — O99213 Obesity complicating pregnancy, third trimester: Secondary | ICD-10-CM

## 2018-01-13 LAB — URINALYSIS, COMPLETE (UACMP) WITH MICROSCOPIC
Bilirubin Urine: NEGATIVE
Glucose, UA: NEGATIVE mg/dL
Ketones, ur: NEGATIVE mg/dL
NITRITE: NEGATIVE
PH: 6 (ref 5.0–8.0)
Protein, ur: NEGATIVE mg/dL
Specific Gravity, Urine: 1.016 (ref 1.005–1.030)

## 2018-01-13 LAB — LIPASE, BLOOD: Lipase: 33 U/L (ref 11–51)

## 2018-01-13 LAB — PROTEIN / CREATININE RATIO, URINE
CREATININE, URINE: 103 mg/dL
Protein Creatinine Ratio: 0.07 mg/mg{Cre} (ref 0.00–0.15)
Total Protein, Urine: 7 mg/dL

## 2018-01-13 LAB — TYPE AND SCREEN
ABO/RH(D): A POS
ANTIBODY SCREEN: NEGATIVE

## 2018-01-13 LAB — CBC
HEMATOCRIT: 34.8 % — AB (ref 36.0–46.0)
Hemoglobin: 11.3 g/dL — ABNORMAL LOW (ref 12.0–15.0)
MCH: 29.8 pg (ref 26.0–34.0)
MCHC: 32.5 g/dL (ref 30.0–36.0)
MCV: 91.8 fL (ref 80.0–100.0)
Platelets: 137 10*3/uL — ABNORMAL LOW (ref 150–400)
RBC: 3.79 MIL/uL — ABNORMAL LOW (ref 3.87–5.11)
RDW: 13.1 % (ref 11.5–15.5)
WBC: 8.3 10*3/uL (ref 4.0–10.5)
nRBC: 0 % (ref 0.0–0.2)

## 2018-01-13 LAB — AMYLASE: Amylase: 51 U/L (ref 28–100)

## 2018-01-13 LAB — COMPREHENSIVE METABOLIC PANEL
ALT: 10 U/L (ref 0–44)
AST: 15 U/L (ref 15–41)
Albumin: 2.8 g/dL — ABNORMAL LOW (ref 3.5–5.0)
Alkaline Phosphatase: 101 U/L (ref 38–126)
Anion gap: 8 (ref 5–15)
BILIRUBIN TOTAL: 0.7 mg/dL (ref 0.3–1.2)
BUN: 8 mg/dL (ref 6–20)
CO2: 22 mmol/L (ref 22–32)
Calcium: 8.1 mg/dL — ABNORMAL LOW (ref 8.9–10.3)
Chloride: 106 mmol/L (ref 98–111)
Creatinine, Ser: 0.57 mg/dL (ref 0.44–1.00)
Glucose, Bld: 81 mg/dL (ref 70–99)
Potassium: 3.7 mmol/L (ref 3.5–5.1)
Sodium: 136 mmol/L (ref 135–145)
Total Protein: 5.9 g/dL — ABNORMAL LOW (ref 6.5–8.1)

## 2018-01-13 NOTE — Final Progress Note (Signed)
Physician Final Progress Note  Patient ID: Kellie Davis MRN: [id number] DOB/AGE: 33-06-1985 33 y.o.  Admit date: 01/13/2018 Admitting provider: Homero Fellers, MD Discharge date: 01/13/2018  Admission Diagnoses: Abdominal pain in pregnancy  Discharge Diagnoses:  Abdominal pain in pregnancy Supervision of high risk pregnancy  History of Present Illness: The patient is a 32 y.o. female [id number] at [redacted]w[redacted]d who presents from clinic with new onset of abdominal pain in the RUQ upon awakening this morning. Has not had vomiting or diarrhea or ingested any foods that may have been spoiled. Has had some nausea. Denies fever, chills, dysuria. Baby is moving well. She is concerned because the pain is on the side where she previously had a cornual resection surgery a few years ago. Nothing has improved it or made it worse.  Review of Systems: Review of systems negative unless otherwise noted in HPI.   Past Medical History:  Diagnosis Date  . Anxiety disorder, unspecified    Situational  . Hypothyroidism     Past Surgical History:  Procedure Laterality Date  . BREAST ENHANCEMENT SURGERY  2014  . CESAREAN SECTION N/A 06/01/2016   Procedure: PRIMARY LOW TRANSVERSE CESAREAN SECTION;  Surgeon: [name], MD;  Location: [facility] ORS;  Service: Obstetrics;  Laterality: N/A;  . COLPOSCOPY  02/25/2012  . LAPAROSCOPIC SALPINGO OOPHERECTOMY Right 01/06/2015   Procedure: LAPAROSCOPIC right SALPINGECTOMYwith removal of ectopic and partial  right corneal resection ;  no oophorectomySurgeon: [name], MD;  Location: [facility] ORS;  Service: Gynecology;  Laterality: Right;  . TONSILLECTOMY  2014    No current facility-administered medications on file prior to encounter.    Current Outpatient Medications on File Prior to Encounter  Medication Sig Dispense Refill  . Magnesium 250 MG TABS Take 250 mg by mouth 2 (two) times daily.     . Prenatal Multivit-Min-Fe-FA (PRE-NATAL PO) Take 1 tablet by  mouth daily.       Allergies  Allergen Reactions  . Codeine Anaphylaxis    Social History   Socioeconomic History  . Marital status: Married    Spouse name: Not on file  . Number of children: Not on file  . Years of education: Not on file  . Highest education level: Not on file  Occupational History  . Not on file  Social Needs  . Financial resource strain: Not hard at all  . Food insecurity:    Worry: Never true    Inability: Never true  . Transportation needs:    Medical: No    Non-medical: No  Tobacco Use  . Smoking status: Never Smoker  . Smokeless tobacco: Never Used  Substance and Sexual Activity  . Alcohol use: No    Comment: occas  . Drug use: No  . Sexual activity: Yes    Birth control/protection: Pill  Lifestyle  . Physical activity:    Days per week: 5 days    Minutes per session: 50 min  . Stress: Not at all  Relationships  . Social connections:    Talks on phone: More than three times a week    Gets together: More than three times a week    Attends religious service: More than 4 times per year    Active member of club or organization: Yes    Attends meetings of clubs or organizations: More than 4 times per year    Relationship status: Married  . Intimate partner violence:    Fear of current or ex partner: No  Emotionally abused: No    Physically abused: No    Forced sexual activity: No  Other Topics Concern  . Not on file  Social History Narrative  . Not on file    Family History  Problem Relation Age of Onset  . Heart disease Mother   . Diabetes Mother   . Hyperlipidemia Mother   No family history of birth defects.   Physical Exam: BP (!) 112/53   Pulse 86   Temp (!) 97.5 F (36.4 C) (Oral)   Resp 14   Ht 5\' 4"  (1.626 m)   Wt 108.9 kg   LMP 04/29/2017   BMI 41.20 kg/m   Gen: NAD CV: Regular rate Pulm: No increased work of breathing Pelvic: closed/-3 Ext: No signs of DVT on exam  NST Baseline: 135 Variability:  moderate Accelerations: present Decelerations: absent Tocometry: occasional contraction The patient was monitored for greater than 1 hour, fetal heart rate tracing was deemed reactive.  Consults: None  Significant Findings/ Diagnostic Studies: labs: CBC normal except for slightly low platelets at 137. CMP, P/C ratio, amylase and lipase within normal limits. Normal RUQ ultrasound and normal uterine findings on bedside ultrasound by MD. Reactive NST.  Procedures: Ultrasound, NST  Discharge Condition: good  Disposition: Discharge home, scheduled for Cesarean on 12/16  Diet: Regular diet  Discharge Activity: Activity as tolerated  Discharge Instructions    Discharge activity:  No Restrictions   Complete by:  As directed    Discharge diet:  No restrictions   Complete by:  As directed    Fetal Kick Count:  Lie on our left side for one hour after a meal, and count the number of times your baby kicks.  If it is less than 5 times, get up, move around and drink some juice.  Repeat the test 30 minutes later.  If it is still less than 5 kicks in an hour, notify your doctor.   Complete by:  As directed    LABOR:  When conractions begin, you should start to time them from the beginning of one contraction to the beginning  of the next.  When contractions are 5 - 10 minutes apart or less and have been regular for at least an hour, you should call your health care provider.   Complete by:  As directed    No sexual activity restrictions   Complete by:  As directed    Notify physician for bleeding from the vagina   Complete by:  As directed    Notify physician for blurring of vision or spots before the eyes   Complete by:  As directed    Notify physician for chills or fever   Complete by:  As directed    Notify physician for fainting spells, "black outs" or loss of consciousness   Complete by:  As directed    Notify physician for increase in vaginal discharge   Complete by:  As directed     Notify physician for leaking of fluid   Complete by:  As directed    Notify physician for pain or burning when urinating   Complete by:  As directed    Notify physician for pelvic pressure (sudden increase)   Complete by:  As directed    Notify physician for severe or continued nausea or vomiting   Complete by:  As directed    Notify physician for sudden gushing of fluid from the vagina (with or without continued leaking)   Complete by:  As  directed    Notify physician for sudden, constant, or occasional abdominal pain   Complete by:  As directed    Notify physician if baby moving less than usual   Complete by:  As directed      Allergies as of 01/13/2018      Reactions   Codeine Anaphylaxis      Medication List    TAKE these medications   Magnesium 250 MG Tabs Take 250 mg by mouth 2 (two) times daily.   PRE-NATAL PO Take 1 tablet by mouth daily.      Labs, ultrasound, and fetal well-being reassuring. Aware to return to triage with worsening symptoms or decreased fetal movement. Scheduled Cesarean delivery on 01/16/2018.  Signed: Rexene Agent, CNM  01/13/2018, 9:37 PM

## 2018-01-13 NOTE — Discharge Summary (Signed)
See Final Progress Note 01/13/2018.

## 2018-01-13 NOTE — Progress Notes (Signed)
Pt given discharge instruction and pre op instructions. Pt educated on use of surgical scrub bath per OR instruction and NPO instruction per Dr Marcello Moores instruction. Labs obtained this visit and blood bank kept in pt chart per blood bank approval. All questions answered and discussed admission and c-section instructions. Pt verbalized understanding.

## 2018-01-14 LAB — RPR: RPR Ser Ql: NONREACTIVE

## 2018-01-16 ENCOUNTER — Inpatient Hospital Stay
Admission: AD | Admit: 2018-01-16 | Payer: Managed Care, Other (non HMO) | Source: Home / Self Care | Admitting: Obstetrics and Gynecology

## 2018-01-16 ENCOUNTER — Inpatient Hospital Stay: Payer: Managed Care, Other (non HMO) | Admitting: Anesthesiology

## 2018-01-16 ENCOUNTER — Inpatient Hospital Stay
Admission: EM | Admit: 2018-01-16 | Discharge: 2018-01-19 | DRG: 787 | Disposition: A | Discharge status: Home / Self Care | Payer: Managed Care, Other (non HMO) | Attending: Obstetrics and Gynecology | Admitting: Obstetrics and Gynecology

## 2018-01-16 ENCOUNTER — Other Ambulatory Visit: Payer: Self-pay

## 2018-01-16 ENCOUNTER — Encounter
Admission: EM | Disposition: A | Discharge status: Home / Self Care | Payer: Self-pay | Source: Home / Self Care | Attending: Obstetrics and Gynecology

## 2018-01-16 DIAGNOSIS — O0910 Supervision of pregnancy with history of ectopic or molar pregnancy, unspecified trimester: Secondary | ICD-10-CM

## 2018-01-16 DIAGNOSIS — O34211 Maternal care for low transverse scar from previous cesarean delivery: Secondary | ICD-10-CM | POA: Diagnosis not present

## 2018-01-16 DIAGNOSIS — O9081 Anemia of the puerperium: Secondary | ICD-10-CM | POA: Diagnosis not present

## 2018-01-16 DIAGNOSIS — Z9079 Acquired absence of other genital organ(s): Secondary | ICD-10-CM | POA: Diagnosis not present

## 2018-01-16 DIAGNOSIS — O0990 Supervision of high risk pregnancy, unspecified, unspecified trimester: Secondary | ICD-10-CM

## 2018-01-16 DIAGNOSIS — Z3A37 37 weeks gestation of pregnancy: Secondary | ICD-10-CM | POA: Diagnosis not present

## 2018-01-16 DIAGNOSIS — D62 Acute posthemorrhagic anemia: Secondary | ICD-10-CM | POA: Diagnosis not present

## 2018-01-16 DIAGNOSIS — O34219 Maternal care for unspecified type scar from previous cesarean delivery: Secondary | ICD-10-CM | POA: Diagnosis present

## 2018-01-16 LAB — CBC
HCT: 33.3 % — ABNORMAL LOW (ref 36.0–46.0)
Hemoglobin: 10.7 g/dL — ABNORMAL LOW (ref 12.0–15.0)
MCH: 29.6 pg (ref 26.0–34.0)
MCHC: 32.1 g/dL (ref 30.0–36.0)
MCV: 92 fL (ref 80.0–100.0)
Platelets: 121 10*3/uL — ABNORMAL LOW (ref 150–400)
RBC: 3.62 MIL/uL — ABNORMAL LOW (ref 3.87–5.11)
RDW: 13.1 % (ref 11.5–15.5)
WBC: 13.9 10*3/uL — ABNORMAL HIGH (ref 4.0–10.5)
nRBC: 0.1 % (ref 0.0–0.2)

## 2018-01-16 SURGERY — Surgical Case
Anesthesia: Spinal

## 2018-01-16 MED ORDER — ACETAMINOPHEN 500 MG PO TABS
1000.0000 mg | ORAL_TABLET | Freq: Four times a day (QID) | ORAL | Status: AC
  Filled 2018-01-16: qty 2

## 2018-01-16 MED ORDER — KETOROLAC TROMETHAMINE 30 MG/ML IJ SOLN: 30.0000 mg | Freq: Four times a day (QID) | INTRAMUSCULAR | Status: AC | PRN

## 2018-01-16 MED ORDER — METHYLERGONOVINE MALEATE 0.2 MG/ML IJ SOLN
INTRAMUSCULAR | Status: AC
  Filled 2018-01-16: qty 1

## 2018-01-16 MED ORDER — LACTATED RINGERS IV BOLUS: 250.0000 mL | Freq: Once | INTRAVENOUS | Status: DC

## 2018-01-16 MED ORDER — OXYTOCIN 40 UNITS IN LACTATED RINGERS INFUSION - SIMPLE MED
2.5000 [IU]/h | INTRAVENOUS | Status: AC
  Filled 2018-01-16: qty 1000

## 2018-01-16 MED ORDER — OXYTOCIN 40 UNITS IN LACTATED RINGERS INFUSION - SIMPLE MED
INTRAVENOUS | Status: AC
  Filled 2018-01-16: qty 1000

## 2018-01-16 MED ORDER — CEFAZOLIN SODIUM-DEXTROSE 2-4 GM/100ML-% IV SOLN
2.0000 g | INTRAVENOUS | Status: AC
  Administered 2018-01-16 (×2): 2 g via INTRAVENOUS
  Filled 2018-01-16: qty 100

## 2018-01-16 MED ORDER — COCONUT OIL OIL
1.0000 "application " | TOPICAL_OIL | Status: DC | PRN
  Filled 2018-01-16: qty 120

## 2018-01-16 MED ORDER — LACTATED RINGERS IV BOLUS
1000.0000 mL | Freq: Once | INTRAVENOUS | Status: AC
  Administered 2018-01-16: 1000 mL via INTRAVENOUS

## 2018-01-16 MED ORDER — DIPHENHYDRAMINE HCL 50 MG/ML IJ SOLN
25.0000 mg | Freq: Once | INTRAMUSCULAR | Status: AC
  Administered 2018-01-16: 25 mg via INTRAVENOUS
  Filled 2018-01-16: qty 1

## 2018-01-16 MED ORDER — SIMETHICONE 80 MG PO CHEW: 80.0000 mg | CHEWABLE_TABLET | ORAL | Status: DC | PRN

## 2018-01-16 MED ORDER — LACTATED RINGERS IV SOLN
INTRAVENOUS | Status: DC
  Administered 2018-01-16 (×2): via INTRAVENOUS

## 2018-01-16 MED ORDER — OXYCODONE-ACETAMINOPHEN 5-325 MG PO TABS: 1.0000 | ORAL_TABLET | ORAL | Status: DC | PRN

## 2018-01-16 MED ORDER — MIDAZOLAM HCL 5 MG/5ML IJ SOLN
INTRAMUSCULAR | Status: DC | PRN
  Administered 2018-01-16: 2 mg via INTRAVENOUS

## 2018-01-16 MED ORDER — WITCH HAZEL-GLYCERIN EX PADS: 1.0000 "application " | MEDICATED_PAD | CUTANEOUS | Status: DC | PRN

## 2018-01-16 MED ORDER — SOD CITRATE-CITRIC ACID 500-334 MG/5ML PO SOLN
30.0000 mL | ORAL | Status: AC
  Administered 2018-01-16: 30 mL via ORAL
  Filled 2018-01-16: qty 30

## 2018-01-16 MED ORDER — BUPIVACAINE HCL 0.5 % IJ SOLN
20.0000 mL | INTRAMUSCULAR | Status: DC
  Filled 2018-01-16: qty 20

## 2018-01-16 MED ORDER — NALBUPHINE HCL 10 MG/ML IJ SOLN: 5.0000 mg | Freq: Once | INTRAMUSCULAR | Status: DC | PRN

## 2018-01-16 MED ORDER — FENTANYL CITRATE (PF) 100 MCG/2ML IJ SOLN: 25.0000 ug | INTRAMUSCULAR | Status: DC | PRN

## 2018-01-16 MED ORDER — DIPHENHYDRAMINE HCL 25 MG PO CAPS
25.0000 mg | ORAL_CAPSULE | Freq: Four times a day (QID) | ORAL | Status: DC | PRN
  Administered 2018-01-16: 25 mg via ORAL

## 2018-01-16 MED ORDER — KETOROLAC TROMETHAMINE 30 MG/ML IJ SOLN
30.0000 mg | Freq: Four times a day (QID) | INTRAMUSCULAR | Status: AC | PRN
  Administered 2018-01-16: 30 mg via INTRAVENOUS
  Filled 2018-01-16 (×2): qty 1

## 2018-01-16 MED ORDER — LACTATED RINGERS IV BOLUS
500.0000 mL | Freq: Once | INTRAVENOUS | Status: AC
  Administered 2018-01-16: 500 mL via INTRAVENOUS

## 2018-01-16 MED ORDER — SODIUM CHLORIDE 0.9% FLUSH: 3.0000 mL | INTRAVENOUS | Status: DC | PRN

## 2018-01-16 MED ORDER — NALOXONE HCL 4 MG/10ML IJ SOLN
1.0000 ug/kg/h | INTRAVENOUS | Status: DC | PRN
  Filled 2018-01-16: qty 5

## 2018-01-16 MED ORDER — BUPIVACAINE 0.25 % ON-Q PUMP DUAL CATH 400 ML
400.0000 mL | INJECTION | Status: DC
  Filled 2018-01-16: qty 400

## 2018-01-16 MED ORDER — MORPHINE SULFATE (PF) 0.5 MG/ML IJ SOLN
INTRAMUSCULAR | Status: AC
  Filled 2018-01-16: qty 10

## 2018-01-16 MED ORDER — BUPIVACAINE ON-Q PAIN PUMP (FOR ORDER SET NO CHG)
INJECTION | Status: DC
  Filled 2018-01-16: qty 1

## 2018-01-16 MED ORDER — FENTANYL CITRATE (PF) 100 MCG/2ML IJ SOLN
INTRAMUSCULAR | Status: DC | PRN
  Administered 2018-01-16 (×2): 50 ug via INTRAVENOUS

## 2018-01-16 MED ORDER — SENNOSIDES-DOCUSATE SODIUM 8.6-50 MG PO TABS
2.0000 | ORAL_TABLET | ORAL | Status: DC
  Administered 2018-01-17 – 2018-01-18 (×2): 2 via ORAL
  Filled 2018-01-16 (×3): qty 2

## 2018-01-16 MED ORDER — SIMETHICONE 80 MG PO CHEW
80.0000 mg | CHEWABLE_TABLET | ORAL | Status: DC
  Filled 2018-01-16: qty 1

## 2018-01-16 MED ORDER — DIPHENHYDRAMINE HCL 25 MG PO CAPS: 25.0000 mg | ORAL_CAPSULE | ORAL | Status: DC | PRN

## 2018-01-16 MED ORDER — BUPIVACAINE HCL (PF) 0.5 % IJ SOLN
INTRAMUSCULAR | Status: AC
  Filled 2018-01-16: qty 30

## 2018-01-16 MED ORDER — NALOXONE HCL 0.4 MG/ML IJ SOLN: 0.4000 mg | INTRAMUSCULAR | Status: DC | PRN

## 2018-01-16 MED ORDER — DIPHENHYDRAMINE HCL 25 MG PO CAPS
ORAL_CAPSULE | ORAL | Status: AC
  Administered 2018-01-16: 25 mg via ORAL
  Filled 2018-01-16: qty 1

## 2018-01-16 MED ORDER — PRENATAL MULTIVITAMIN CH
1.0000 | ORAL_TABLET | Freq: Every day | ORAL | Status: DC
  Administered 2018-01-17 – 2018-01-18 (×2): 1 via ORAL
  Filled 2018-01-16 (×3): qty 1

## 2018-01-16 MED ORDER — DIPHENHYDRAMINE HCL 50 MG/ML IJ SOLN: 12.5000 mg | INTRAMUSCULAR | Status: DC | PRN

## 2018-01-16 MED ORDER — MENTHOL 3 MG MT LOZG
1.0000 | LOZENGE | OROMUCOSAL | Status: DC | PRN
  Filled 2018-01-16: qty 9

## 2018-01-16 MED ORDER — NALBUPHINE HCL 10 MG/ML IJ SOLN: 5.0000 mg | INTRAMUSCULAR | Status: DC | PRN

## 2018-01-16 MED ORDER — MEPERIDINE HCL 25 MG/ML IJ SOLN: 6.2500 mg | INTRAMUSCULAR | Status: DC | PRN

## 2018-01-16 MED ORDER — DIBUCAINE 1 % RE OINT: 1.0000 "application " | TOPICAL_OINTMENT | RECTAL | Status: DC | PRN

## 2018-01-16 MED ORDER — LACTATED RINGERS IV SOLN: INTRAVENOUS | Status: DC

## 2018-01-16 MED ORDER — CARBOPROST TROMETHAMINE 250 MCG/ML IM SOLN
INTRAMUSCULAR | Status: AC
  Filled 2018-01-16: qty 1

## 2018-01-16 MED ORDER — IBUPROFEN 600 MG PO TABS
600.0000 mg | ORAL_TABLET | Freq: Four times a day (QID) | ORAL | Status: DC | PRN
  Administered 2018-01-17 – 2018-01-19 (×9): 600 mg via ORAL
  Filled 2018-01-16 (×9): qty 1

## 2018-01-16 MED ORDER — LACTATED RINGERS IV SOLN
INTRAVENOUS | Status: DC | PRN
  Administered 2018-01-16: 13:00:00 via INTRAVENOUS

## 2018-01-16 MED ORDER — SIMETHICONE 80 MG PO CHEW
80.0000 mg | CHEWABLE_TABLET | Freq: Three times a day (TID) | ORAL | Status: DC
  Administered 2018-01-17 – 2018-01-18 (×6): 80 mg via ORAL
  Filled 2018-01-16 (×6): qty 1

## 2018-01-16 MED ORDER — SODIUM CHLORIDE 0.9 % IV SOLN
INTRAVENOUS | Status: DC | PRN
  Administered 2018-01-16 (×2): 50 ug/min via INTRAVENOUS

## 2018-01-16 MED ORDER — ONDANSETRON HCL 4 MG/2ML IJ SOLN: 4.0000 mg | Freq: Once | INTRAMUSCULAR | Status: DC | PRN

## 2018-01-16 MED ORDER — ONDANSETRON HCL 4 MG/2ML IJ SOLN
INTRAMUSCULAR | Status: DC | PRN
  Administered 2018-01-16: 4 mg via INTRAVENOUS

## 2018-01-16 SURGICAL SUPPLY — 26 items
BAG COUNTER SPONGE EZ (MISCELLANEOUS) ×2 IMPLANT
CANISTER SUCT 3000ML PPV (MISCELLANEOUS) ×2 IMPLANT
CATH KIT ON-Q SILVERSOAK 5IN (CATHETERS) ×4 IMPLANT
CHLORAPREP W/TINT 26ML (MISCELLANEOUS) ×4 IMPLANT
DERMABOND ADVANCED (GAUZE/BANDAGES/DRESSINGS) ×1
DERMABOND ADVANCED .7 DNX12 (GAUZE/BANDAGES/DRESSINGS) ×1 IMPLANT
DRSG OPSITE POSTOP 4X10 (GAUZE/BANDAGES/DRESSINGS) ×2 IMPLANT
DRSG TELFA 3X8 NADH (GAUZE/BANDAGES/DRESSINGS) ×2 IMPLANT
ELECT CAUTERY BLADE 6.4 (BLADE) ×2 IMPLANT
ELECT REM PT RETURN 9FT ADLT (ELECTROSURGICAL) ×2
ELECTRODE REM PT RTRN 9FT ADLT (ELECTROSURGICAL) ×1 IMPLANT
GAUZE SPONGE 4X4 12PLY STRL (GAUZE/BANDAGES/DRESSINGS) ×2 IMPLANT
GLOVE BIO SURGEON STRL SZ7 (GLOVE) ×2 IMPLANT
GLOVE INDICATOR 7.5 STRL GRN (GLOVE) ×2 IMPLANT
GOWN STRL REUS W/ TWL LRG LVL3 (GOWN DISPOSABLE) ×3 IMPLANT
GOWN STRL REUS W/TWL LRG LVL3 (GOWN DISPOSABLE) ×3
NS IRRIG 1000ML POUR BTL (IV SOLUTION) ×2 IMPLANT
PACK C SECTION AR (MISCELLANEOUS) ×2 IMPLANT
PAD OB MATERNITY 4.3X12.25 (PERSONAL CARE ITEMS) ×2 IMPLANT
PAD PREP 24X41 OB/GYN DISP (PERSONAL CARE ITEMS) ×2 IMPLANT
STRIP CLOSURE SKIN 1/2X4 (GAUZE/BANDAGES/DRESSINGS) ×2 IMPLANT
SUT MNCRL AB 4-0 PS2 18 (SUTURE) ×2 IMPLANT
SUT PDS AB 1 TP1 96 (SUTURE) ×4 IMPLANT
SUT VIC AB 0 CTX 36 (SUTURE) ×2
SUT VIC AB 0 CTX36XBRD ANBCTRL (SUTURE) ×2 IMPLANT
SUT VIC AB 2-0 CT1 36 (SUTURE) ×2 IMPLANT

## 2018-01-16 NOTE — Op Note (Signed)
Preoperative Diagnosis: 1) 32 y.o. [id number] at [redacted]w[redacted]d 2) History of prior cornual resection 3) History of prior cesarean section   Postoperative Diagnosis: 1) 32 y.o. [id number] at [redacted]w[redacted]d 2) History of prior cornual resection 3) History of prior cesarean section  Operation Performed: Repeat low transverse C-section via pfannenstiel skin incision  Indiciation: History of prior cesarean and history of cornual resection   Anesthesia: Spinal  Primary Surgeon: [name], MD   Assistant: Barnett Applebaum, MD this surgery required a high level surgical assistant with none other readily available  Preoperative Antibiotics:2g ancef  Estimated Blood Loss:69mL  IV Fluids: 1L  Drains or Tubes: Foley to gravity drainage, ON-Q catheter system  Implants: none  Specimens Removed: none  Complications: none  Intraoperative Findings:  Normal left tube, normal ovaries and uterus.  Absent right fallopian tube.  Cornual resection intact.  Delivery resulted in the birth of a liveborn female, APGAR (1 MIN): 9   APGAR (5 MINS): 10 , weight 6lbs 6oz  Patient Condition:stable  Procedure in Detail:  Patient was taken to the operating room were she was administered regional anesthesia.  She was positioned in the supine position, prepped and draped in the  Usual sterile fashion.  Prior to proceeding with the case a time out was performed and the level of anesthetic was checked and noted to be adequate.  Utilizing the scalpel a pfannenstiel skin incision was made 2cm above the pubic symphysis utilizing the patient's pre-existing scar and carried down sharply to the the level of the rectus fascia.  The fascia was incised in the midline using the scalpel and then extended using mayo scissors.  The superior border of the rectus fascia was grasped with two Kocher clamps and the underlying rectus muscles were dissected of the fascia using blunt dissection.  The median raphae was incised using Mayo scissors.   The  inferior border of the rectus fascia was dissected of the rectus muscles in a similar fashion.  The midline was identified, the peritoneum was entered bluntly and expanded using manual tractions.  The uterus was noted to be in a none rotated position.  Next the bladder blade was placed retracting the bladder caudally.  A bladder flap was  created. The bladder reflection was grasped with a pickup, and Metzenbaum scissors were then used the undermine the bladder reflection.  The bladder flap was developed using digital dissection.  The bladder blade was replaced retracting the bladder caudally out of the operative field.;l A low transverse incision was scored on the lower uterine segment.  The hysterotomy was entered bluntly using the operators finger.  The hysterotomy incision was extended using manual traction.  The operators hand was placed within the hysterotomy position noting the fetus to be within the OA position.  The vertex was grasped, flexed, brought to the incision, and delivered a traumatically using fundal pressure.  The remainder of the body delivered with ease.  The infant was suctioned, cord was clamped and cut before handing off to the awaiting neonatologist.  The placenta was delivered using manual extraction.  The uterus was exteriorized, wiped clean of clots and debris using two moist laps.  The hysterotomy was closed using a two layer closure of 0 Vicryl, with the first being a running locked, the second a vertical imbricating.  The uterus was returned to the abdomen.  The peritoneal gutters were wiped clean of clots and debris using two moist laps.  The hysterotomy incision was re-inspected noted to be hemostatic.  The  rectus muscles were re-approximated in the midline using a single 2-0 Vicryl mattress stitch.  The rectus muscles were inspected noted to be hemostatic.  The superior border of the rectus fascia was grasped with a Kocher clamp.  The ON-Q trocars were then placed 4cm above the  superior border of the incision and tunneled subfascially.  The introducers were removed and the catheters were threaded through the sleeves after which the sleeves were removed.  The fascia was closed using a looped #1 PDS in a running fashion taking 1cm by 1cm bites.  The subcutaneous tissue was irrigated using warm saline, hemostasis achieved using the bovie.  The subcutaneous dead space was greater than 3cm and was closed. The subcutaneous dead space was obliterated by using a 53-T 0 Chromic in a running fashion.   The skin was closed using 4-0 Monocryl in a subcuticular fashion.  Sponge needle and instrument counts were corrects times two.  The patient tolerated the procedure well and was taken to the recovery room in stable condition.

## 2018-01-16 NOTE — Transfer of Care (Signed)
Immediate Anesthesia Transfer of Care Note  Patient: Kellie Davis  Procedure(s) Performed: CESAREAN SECTION (N/A )  Patient Location: PACU and Mother/Baby  Anesthesia Type:Spinal  Level of Consciousness: awake, alert  and oriented  Airway & Oxygen Therapy: Patient Spontanous Breathing  Post-op Assessment: Report given to RN and Post -op Vital signs reviewed and stable  Post vital signs: Reviewed and stable  Last Vitals:  Vitals Value Taken Time  BP    Temp    Pulse    Resp    SpO2      Last Pain:  Vitals:   01/16/18 1052  TempSrc:   PainSc: 0-No pain         Complications: No apparent anesthesia complications

## 2018-01-16 NOTE — Anesthesia Preprocedure Evaluation (Addendum)
Anesthesia Evaluation  Patient identified by MRN, date of birth, ID band Patient awake    Reviewed: Allergy & Precautions, NPO status , Patient's Chart, lab work & pertinent test results  History of Anesthesia Complications (+) PONV and history of anesthetic complications  Airway Mallampati: III       Dental   Pulmonary neg sleep apnea, neg COPD,           Cardiovascular (-) hypertension(-) Past MI and (-) CHF (-) dysrhythmias (-) Valvular Problems/Murmurs     Neuro/Psych neg Seizures (febrile sz as a child, no meds) Anxiety    GI/Hepatic Neg liver ROS, GERD  ,  Endo/Other  neg diabetesHypothyroidism (no meds)   Renal/GU negative Renal ROS     Musculoskeletal   Abdominal   Peds  Hematology   Anesthesia Other Findings   Reproductive/Obstetrics                             Anesthesia Physical Anesthesia Plan  ASA: II  Anesthesia Plan: Spinal   Post-op Pain Management:    Induction:   PONV Risk Score and Plan: 4 or greater  Airway Management Planned:   Additional Equipment:   Intra-op Plan:   Post-operative Plan:   Informed Consent: I have reviewed the patients History and Physical, chart, labs and discussed the procedure including the risks, benefits and alternatives for the proposed anesthesia with the patient or authorized representative who has indicated his/her understanding and acceptance.     Plan Discussed with:   Anesthesia Plan Comments:         Anesthesia Quick Evaluation

## 2018-01-16 NOTE — Anesthesia Post-op Follow-up Note (Signed)
Anesthesia QCDR form completed.        

## 2018-01-16 NOTE — Discharge Summary (Signed)
Obstetric Discharge Summary Reason for Admission: cesarean section Prenatal Procedures: none Intrapartum Procedures: cesarean: low transverse Postpartum Procedures: none Complications-Operative and Postpartum: none Hemoglobin  Date Value Ref Range Status  01/17/2018 10.0 (L) 12.0 - 15.0 g/dL Final  11/16/2017 11.7 11.1 - 15.9 g/dL Final  11/06/2015 13.3 g/dL Final   HCT  Date Value Ref Range Status  01/17/2018 32.0 (L) 36.0 - 46.0 % Final   Hematocrit  Date Value Ref Range Status  11/16/2017 33.7 (L) 34.0 - 46.6 % Final    Physical Exam:  General: alert, cooperative and no distress Lochia: appropriate Uterine Fundus: firm Incision: Dressing C/D/I DVT Evaluation: No cords or calf tenderness. No significant calf/ankle edema.  Discharge Diagnoses: Term Pregnancy-delivered  Discharge Information: Date: 01/19/2018 Activity: pelvic rest Diet: routine Allergies as of 01/19/2018      Reactions   Codeine Anaphylaxis      Medication List    TAKE these medications   Magnesium 250 MG Tabs Take 250 mg by mouth 2 (two) times daily.   PRE-NATAL PO Take 1 tablet by mouth daily.            Discharge Care Instructions  (From admission, onward)         Start     Ordered   01/19/18 0000  Discharge wound care:    Comments:  You may apply a light dressing for minor discharge from the incision or to keep waistbands of clothing from rubbing.  You may also have been discharge with a clear dressing in which case this will be removed at your postoperative clinic visit.  You may shower, use soap on your incision.  Avoid baths or soaking the incision in the first 6 weeks following your surgery..   01/19/18 4158          Condition: stable Discharge to: home Follow-up Information    [name], MD In 1 week.   Specialty:  Obstetrics and Gynecology Why:  For wound re-check Contact information: [place] [phone] [phone]            Newborn Data: Live born female  Birth Weight:   APGAR: 35, 10  Newborn Delivery   Birth date/time:  01/16/2018 12:57:00 Delivery type:  Cesarean     Home with mother.  Rexene Agent 01/19/2018, 9:26 AM

## 2018-01-16 NOTE — H&P (Signed)
Date of Initial H&P: 01/13/2018  History reviewed, patient examined, no change in status, stable for surgery.

## 2018-01-16 NOTE — Anesthesia Procedure Notes (Signed)
Spinal  Patient location during procedure: OR Start time: 01/16/2018 12:38 PM End time: 01/16/2018 12:43 PM Staffing Anesthesiologist: Gunnar Fusi, MD Performed: anesthesiologist  Preanesthetic Checklist Completed: patient identified, site marked, surgical consent, pre-op evaluation, timeout performed, IV checked, risks and benefits discussed and monitors and equipment checked Spinal Block Patient position: sitting Prep: Betadine Patient monitoring: heart rate, continuous pulse ox, blood pressure and cardiac monitor Approach: midline Location: L4-5 Injection technique: single-shot Needle Needle type: Whitacre and Introducer  Needle gauge: 24 G Needle length: 9 cm Needle insertion depth: 8 cm Additional Notes Negative paresthesia. Negative blood return. Positive free-flowing CSF. Expiration date of kit checked and confirmed. Patient tolerated procedure well, without complications.

## 2018-01-17 LAB — CBC
HCT: 32 % — ABNORMAL LOW (ref 36.0–46.0)
Hemoglobin: 10 g/dL — ABNORMAL LOW (ref 12.0–15.0)
MCH: 29.3 pg (ref 26.0–34.0)
MCHC: 31.3 g/dL (ref 30.0–36.0)
MCV: 93.8 fL (ref 80.0–100.0)
PLATELETS: 120 10*3/uL — AB (ref 150–400)
RBC: 3.41 MIL/uL — ABNORMAL LOW (ref 3.87–5.11)
RDW: 13.2 % (ref 11.5–15.5)
WBC: 9.6 10*3/uL (ref 4.0–10.5)
nRBC: 0 % (ref 0.0–0.2)

## 2018-01-17 NOTE — Lactation Note (Signed)
This note was copied from a baby's chart. Lactation Consultation Note  Patient Name: Kellie Davis [id number] Date: 01/17/2018 Reason for consult: Follow-up assessment   Maternal Data Formula Feeding for Exclusion: No Has patient been taught Hand Expression?: Yes Does the patient have breastfeeding experience prior to this delivery?: Yes  Feeding Feeding Type: Breast Fed  LATCH Score Latch: Repeated attempts needed to sustain latch, nipple held in mouth throughout feeding, stimulation needed to elicit sucking reflex.  Audible Swallowing: A few with stimulation  Type of Nipple: Everted at rest and after stimulation  Comfort (Breast/Nipple): Soft / non-tender  Hold (Positioning): Assistance needed to correctly position infant at breast and maintain latch.  LATCH Score: 7  Interventions Interventions: Breast feeding basics reviewed;Assisted with latch;Skin to skin  Lactation Tools Discussed/Used     Consult Status Consult Status: Follow-up Date: 01/17/18 Follow-up type: In-patient  LC evaluated baby's latch. RN had to assist with flanging upper lip out, but that was parent's only concern about breastfeeding. LC has assisted family with prior babies who all have breastfed well. Parents know how to contact lactation and resources for help after d/c.    Marnee Spring 01/17/2018, 11:21 AM

## 2018-01-17 NOTE — Progress Notes (Signed)
  Subjective:   Post Op Day 1: Patient is doing well. Her pain is well controlled with PO pain medication and On Q pump. She is tolerating PO intake. She is ambulating and voiding without difficulty. She is breastfeeding and reports some issues with latch. She is awaiting visit with lactation.  Objective:  Blood pressure 109/68, pulse 65, temperature 97.9 F (36.6 C), temperature source Oral, resp. rate 20, height 5\' 4"  (1.626 m), weight 108.9 kg, last menstrual period 04/29/2017, SpO2 100 %, currently breastfeeding.  General: NAD Pulmonary: no increased work of breathing Abdomen: non-distended, non-tender, fundus firm at level of umbilicus Incision: C/D/I, On Q pump is intact Extremities: no edema, no erythema, no tenderness  Results for orders placed or performed during the hospital encounter of 01/16/18 (from the past 24 hour(s))  CBC     Status: Abnormal   Collection Time: 01/16/18  6:20 PM  Result Value Ref Range   WBC 13.9 (H) 4.0 - 10.5 K/uL   RBC 3.62 (L) 3.87 - 5.11 MIL/uL   Hemoglobin 10.7 (L) 12.0 - 15.0 g/dL   HCT 33.3 (L) 36.0 - 46.0 %   MCV 92.0 80.0 - 100.0 fL   MCH 29.6 26.0 - 34.0 pg   MCHC 32.1 30.0 - 36.0 g/dL   RDW 13.1 11.5 - 15.5 %   Platelets 121 (L) 150 - 400 K/uL   nRBC 0.1 0.0 - 0.2 %  CBC     Status: Abnormal   Collection Time: 01/17/18  4:57 AM  Result Value Ref Range   WBC 9.6 4.0 - 10.5 K/uL   RBC 3.41 (L) 3.87 - 5.11 MIL/uL   Hemoglobin 10.0 (L) 12.0 - 15.0 g/dL   HCT 32.0 (L) 36.0 - 46.0 %   MCV 93.8 80.0 - 100.0 fL   MCH 29.3 26.0 - 34.0 pg   MCHC 31.3 30.0 - 36.0 g/dL   RDW 13.2 11.5 - 15.5 %   Platelets 120 (L) 150 - 400 K/uL   nRBC 0.0 0.0 - 0.2 %    Intake/Output Summary (Last 24 hours) at 01/17/2018 1038 Last data filed at 01/17/2018 0830 Gross per 24 hour  Intake 2211.62 ml  Output 2660 ml  Net -448.38 ml      Assessment:   32 y.o. [id number] postoperativeday # 1   Plan:  1) Acute blood loss anemia - hemodynamically stable  and asymptomatic - po ferrous sulfate  2) A positive, Rubella Immune, Varicella Immune  3) TDAP status: given antepartum   4) Breastfeeding/Contraception: not discussed  5) Disposition: Continue routine postpartum care following cesarean section   Rod Can, CNM

## 2018-01-17 NOTE — Anesthesia Postprocedure Evaluation (Signed)
Anesthesia Post Note  Patient: Nelva Bush  Procedure(s) Performed: CESAREAN SECTION (N/A )  Patient location during evaluation: Mother Baby Anesthesia Type: Spinal Level of consciousness: oriented and awake and alert Pain management: pain level controlled Vital Signs Assessment: post-procedure vital signs reviewed and stable Respiratory status: spontaneous breathing and respiratory function stable Cardiovascular status: blood pressure returned to baseline and stable Postop Assessment: no headache, no backache, no apparent nausea or vomiting and able to ambulate Anesthetic complications: no     Last Vitals:  Vitals:   01/17/18 0600 01/17/18 0700  BP:    Pulse:    Resp:    Temp:    SpO2: 100% 97%    Last Pain:  Vitals:   01/17/18 0700  TempSrc:   PainSc: Asleep                 Hedda Slade

## 2018-01-18 NOTE — Progress Notes (Signed)
   Subjective:  Doing well no concerns.  Tolerating po.  Pain well controlled has mostly been using ibuprofen and tylenol.  Lochia appropriate  Objective:  Vital signs in last 24 hours: Temp:  [97.9 F (36.6 C)-98.4 F (36.9 C)] 98.1 F (36.7 C) (12/17 2300) Pulse Rate:  [58-71] 58 (12/17 2300) Resp:  [18-20] 18 (12/17 2300) BP: (96-109)/(45-74) 101/59 (12/17 2300) SpO2:  [96 %-100 %] 97 % (12/17 2300)    Intake/Output      12/17 0701 - 12/18 0700 12/18 0701 - 12/19 0700   I.V. (mL/kg)     IV Piggyback     Total Intake(mL/kg)     Urine (mL/kg/hr) 420 (0.2)    Blood     Total Output 420    Net -420         Urine Occurrence 2 x      General: NAD Pulmonary: no increased work of breathing Abdomen: non-distended, non-tender, fundus firm at level of umbilicus Incision: Incision D/C/I Extremities: no edema, no erythema, no tenderness  Results for orders placed or performed during the hospital encounter of 01/16/18 (from the past 72 hour(s))  CBC     Status: Abnormal   Collection Time: 01/16/18  6:20 PM  Result Value Ref Range   WBC 13.9 (H) 4.0 - 10.5 K/uL   RBC 3.62 (L) 3.87 - 5.11 MIL/uL   Hemoglobin 10.7 (L) 12.0 - 15.0 g/dL   HCT 33.3 (L) 36.0 - 46.0 %   MCV 92.0 80.0 - 100.0 fL   MCH 29.6 26.0 - 34.0 pg   MCHC 32.1 30.0 - 36.0 g/dL   RDW 13.1 11.5 - 15.5 %   Platelets 121 (L) 150 - 400 K/uL   nRBC 0.1 0.0 - 0.2 %    Comment: Performed at [facility], [place] [address] [place], [place] [address]  CBC     Status: Abnormal   Collection Time: 01/17/18  4:57 AM  Result Value Ref Range   WBC 9.6 4.0 - 10.5 K/uL   RBC 3.41 (L) 3.87 - 5.11 MIL/uL   Hemoglobin 10.0 (L) 12.0 - 15.0 g/dL   HCT 32.0 (L) 36.0 - 46.0 %   MCV 93.8 80.0 - 100.0 fL   MCH 29.3 26.0 - 34.0 pg   MCHC 31.3 30.0 - 36.0 g/dL   RDW 13.2 11.5 - 15.5 %   Platelets 120 (L) 150 - 400 K/uL    Comment: Immature Platelet Fraction may be clinically indicated, consider ordering this additional  test [id number]    nRBC 0.0 0.0 - 0.2 %    Comment: Performed at [facility], [address], [place], [place] [address]    Immunization History  Administered Date(s) Administered  . Influenza-Unspecified 11/15/2016, 11/02/2017  . Tdap 04/19/2016, 11/30/2017    Assessment:   32 y.o. [id number] postoperativeday # 2 RLTCS   Plan:  ) Acute blood loss anemia - hemodynamically stable and asymptomatic - po ferrous sulfate  2) Blood Type --/--/A POS (12/13 1127) / Rubella 8.31 (06/11 1622) / Varicella Immune  3) TDAP status up to date  4) Feeding plan breast  5)  Education given regarding options for contraception, as well as compatibility with breast feeding if applicable.    6) Disposition pending delivery  [name], MD, [place] 01/18/2018, 8:15 AM

## 2018-01-18 NOTE — Lactation Note (Signed)
This note was copied from a baby's chart. Lactation Consultation Note  Patient Name: Kellie Davis Today's Date: 01/18/2018     Maternal Data    Feeding Feeding Type: Breast Fed  LATCH Score                   Interventions    Lactation Tools Discussed/Used     Consult Status  MOB states that breastfeeding is much better today now that baby flanges upper lip out during nursing sessions. Parents know where to go for Lactation help and know of resources post d/c. They also do not have any questions or concerns at this time.     Marnee Spring 01/18/2018, 12:37 PM

## 2018-01-19 NOTE — Plan of Care (Signed)
Vs stable; up ad lib; tolerating regular diet; taking motrin for pain control; breastfeeding and doing well; on-q pump dressing changed this shift along with applying dermabond to try to prevent leaking at the site

## 2018-01-19 NOTE — Progress Notes (Signed)
Patient discharged home with infant. Discharge instructions, prescriptions and follow up appointment given to and reviewed with patient. Patient verbalized understanding. Patient wheeled out with infant by auxiliary.  

## 2018-01-19 NOTE — Lactation Note (Signed)
This note was copied from a baby's chart. Lactation Consultation Note  Patient Name: Kellie Davis Today's Date: 01/19/2018 Reason for consult: Initial assessment   Maternal Data    Feeding Feeding Type: Breast Fed  LATCH Score Latch: Repeated attempts needed to sustain latch, nipple held in mouth throughout feeding, stimulation needed to elicit sucking reflex.  Audible Swallowing: A few with stimulation  Type of Nipple: Everted at rest and after stimulation(nipple is pinched)  Comfort (Breast/Nipple): Soft / non-tender  Hold (Positioning): Assistance needed to correctly position infant at breast and maintain latch.  LATCH Score: 7  Interventions Interventions: Breast compression;Assisted with latch;DEBP  Lactation Tools Discussed/Used Pump Review: Setup, frequency, and cleaning Initiated by:: Alicia Lilly, RN IBCLC Date initiated:: 01/19/18   Consult Status Consult Status: Follow-up Date: 01/19/18 Follow-up type: In-patient Mom is concerned that infant is not possibly getting enough because he is falling asleep too much at the breast. (He is on the breast for 20 minutes but only actively feeding for 5-8 minutes). Mother is also concerned that there were not many wet diapers yesterday and about infant's mouth and states, "he has blisters all around the inside of his lips." LC did oral exam and infant has a tight top upper lip and seems to curl his top lip when sucking. Infant stayed on left breast for 3 minutes before falling asleep and fed for 5 minutes on the left breast with the faster flow. After breastfeeding infant was still showing hunger signs so mother was provided a pump kit and instructed to pump and feed infant her pumped breast-milk via bottle. Parents were given information on local Pediatric dentist if they chose to have infant's mouth evaluated for oral restrictions.    Alicia Lilly 01/19/2018, 10:50 AM    

## 2018-01-27 ENCOUNTER — Encounter: Payer: Self-pay | Admitting: Obstetrics and Gynecology

## 2018-01-27 ENCOUNTER — Ambulatory Visit (INDEPENDENT_AMBULATORY_CARE_PROVIDER_SITE_OTHER): Payer: Managed Care, Other (non HMO) | Admitting: Obstetrics and Gynecology

## 2018-01-27 ENCOUNTER — Ambulatory Visit: Payer: Managed Care, Other (non HMO) | Admitting: Obstetrics and Gynecology

## 2018-01-27 VITALS — BP 118/80 | Ht 64.0 in | Wt 237.0 lb

## 2018-01-27 DIAGNOSIS — Z4889 Encounter for other specified surgical aftercare: Secondary | ICD-10-CM

## 2018-02-06 ENCOUNTER — Ambulatory Visit (INDEPENDENT_AMBULATORY_CARE_PROVIDER_SITE_OTHER): Payer: BLUE CROSS/BLUE SHIELD | Admitting: Obstetrics and Gynecology

## 2018-02-06 ENCOUNTER — Encounter: Payer: Self-pay | Admitting: Obstetrics and Gynecology

## 2018-02-06 VITALS — BP 110/60 | HR 84 | Ht 64.0 in | Wt 239.0 lb

## 2018-02-06 DIAGNOSIS — O909 Complication of the puerperium, unspecified: Secondary | ICD-10-CM

## 2018-02-06 DIAGNOSIS — Z4889 Encounter for other specified surgical aftercare: Secondary | ICD-10-CM

## 2018-02-06 MED ORDER — FLUCONAZOLE 150 MG PO TABS: 150.0000 mg | ORAL_TABLET | Freq: Once | ORAL | 0 refills | Status: AC

## 2018-02-06 MED ORDER — AMOXICILLIN-POT CLAVULANATE 875-125 MG PO TABS: 1.0000 | ORAL_TABLET | Freq: Two times a day (BID) | ORAL | 0 refills | Status: AC

## 2018-02-06 NOTE — Progress Notes (Signed)
      Postoperative Follow-up Patient presents post op from RLTCS 1weeks ago for history of prior c-section, cornual resection.  Subjective: Patient reports marked improvement in her preop symptoms. Eating a regular diet without difficulty. Pain is controlled without any medications.  Activity: normal activities of daily living.  Objective: Blood pressure 118/80, height 5\' 4"  (1.626 m), weight 237 lb (107.5 kg), unknown if currently breastfeeding.  General: NAD Pulmonary: no increased work of breathing Abdomen: soft, non-tender, non-distended, incision(s) D/C/I GU: normal external female genitalia vaginal cuff intact, well healed Extremities: no edema Neurologic: normal gait    Admission on 01/16/2018, Discharged on 01/19/2018  Component Date Value Ref Range Status  . WBC 01/16/2018 13.9* 4.0 - 10.5 K/uL Final  . RBC 01/16/2018 3.62* 3.87 - 5.11 MIL/uL Final  . Hemoglobin 01/16/2018 10.7* 12.0 - 15.0 g/dL Final  . HCT 01/16/2018 33.3* 36.0 - 46.0 % Final  . MCV 01/16/2018 92.0  80.0 - 100.0 fL Final  . MCH 01/16/2018 29.6  26.0 - 34.0 pg Final  . MCHC 01/16/2018 32.1  30.0 - 36.0 g/dL Final  . RDW 01/16/2018 13.1  11.5 - 15.5 % Final  . Platelets 01/16/2018 121* 150 - 400 K/uL Final  . nRBC 01/16/2018 0.1  0.0 - 0.2 % Final   Performed at [facility], [address], [place], [place] [address]  . WBC 01/17/2018 9.6  4.0 - 10.5 K/uL Final  . RBC 01/17/2018 3.41* 3.87 - 5.11 MIL/uL Final  . Hemoglobin 01/17/2018 10.0* 12.0 - 15.0 g/dL Final  . HCT 01/17/2018 32.0* 36.0 - 46.0 % Final  . MCV 01/17/2018 93.8  80.0 - 100.0 fL Final  . MCH 01/17/2018 29.3  26.0 - 34.0 pg Final  . MCHC 01/17/2018 31.3  30.0 - 36.0 g/dL Final  . RDW 01/17/2018 13.2  11.5 - 15.5 % Final  . Platelets 01/17/2018 120* 150 - 400 K/uL Final   Comment: Immature Platelet Fraction may be clinically indicated, consider ordering this additional test [id number]   . nRBC 01/17/2018 0.0  0.0 - 0.2  % Final   Performed at [facility], [address] [place], [place] [address]  . Varicella 07/12/2017 Immune   Final    Assessment: 33 y.o. s/p RLTCS stable  Plan: Patient has done well after surgery with no apparent complications.  I have discussed the post-operative course to date, and the expected progress moving forward.  The patient understands what complications to be concerned about.  I will see the patient in routine follow up, or sooner if needed.    Activity plan: No lfiting  [name], MD, [facility], [place]

## 2018-02-06 NOTE — Progress Notes (Signed)
      Postoperative Follow-up Patient presents post op from RLTCS 2weeks ago for history of prior C-section.  Subjective: Patient reports marked improvement in her preop symptoms. Eating a regular diet without difficulty. Pain is controlled without any medications.  Activity: normal activities of daily living.  Has noted some redness and discharge from right aspect of the incision  Objective: Blood pressure 110/60, pulse 84, height 5\' 4"  (1.626 m), weight 239 lb (108.4 kg), currently breastfeeding.  General: NAD Pulmonary: no increased work of breathing Abdomen: soft, non-tender, non-distended, incision healing well.  There is a small amount of minimal serous discharge noted at the right aspect of the incision.  No obvious skin defect, no induration, erythema, or fluctuance.    Extremities: no edema Neurologic: normal gait    Admission on 01/16/2018, Discharged on 01/19/2018  Component Date Value Ref Range Status  . WBC 01/16/2018 13.9* 4.0 - 10.5 K/uL Final  . RBC 01/16/2018 3.62* 3.87 - 5.11 MIL/uL Final  . Hemoglobin 01/16/2018 10.7* 12.0 - 15.0 g/dL Final  . HCT 01/16/2018 33.3* 36.0 - 46.0 % Final  . MCV 01/16/2018 92.0  80.0 - 100.0 fL Final  . MCH 01/16/2018 29.6  26.0 - 34.0 pg Final  . MCHC 01/16/2018 32.1  30.0 - 36.0 g/dL Final  . RDW 01/16/2018 13.1  11.5 - 15.5 % Final  . Platelets 01/16/2018 121* 150 - 400 K/uL Final  . nRBC 01/16/2018 0.1  0.0 - 0.2 % Final   Performed at [facility], [address], [place], [place] [address]  . WBC 01/17/2018 9.6  4.0 - 10.5 K/uL Final  . RBC 01/17/2018 3.41* 3.87 - 5.11 MIL/uL Final  . Hemoglobin 01/17/2018 10.0* 12.0 - 15.0 g/dL Final  . HCT 01/17/2018 32.0* 36.0 - 46.0 % Final  . MCV 01/17/2018 93.8  80.0 - 100.0 fL Final  . MCH 01/17/2018 29.3  26.0 - 34.0 pg Final  . MCHC 01/17/2018 31.3  30.0 - 36.0 g/dL Final  . RDW 01/17/2018 13.2  11.5 - 15.5 % Final  . Platelets 01/17/2018 120* 150 - 400 K/uL Final   Comment: Immature Platelet Fraction may be clinically indicated, consider ordering this additional test [id number]   . nRBC 01/17/2018 0.0  0.0 - 0.2 % Final   Performed at [facility], [address] [place], [place] [address]  . Varicella 07/12/2017 Immune   Final    Assessment: 33 y.o. s/p RLTCS, possible early cellulitis vs small seroma, otherwise stable  Plan: Patient has done well after surgery with no apparent complications.  I have discussed the post-operative course to date, and the expected progress moving forward.  The patient understands what complications to be concerned about.  I will see the patient in routine follow up, or sooner if needed.    Activity plan: No heavy lifting.  Augmentin for possible early cellulitis although favor small seroma Rx diflucan candida   [name], MD, [place], [place]

## 2018-02-27 ENCOUNTER — Ambulatory Visit (INDEPENDENT_AMBULATORY_CARE_PROVIDER_SITE_OTHER): Payer: BLUE CROSS/BLUE SHIELD | Admitting: Obstetrics and Gynecology

## 2018-02-27 ENCOUNTER — Encounter: Payer: Self-pay | Admitting: Obstetrics and Gynecology

## 2018-02-27 ENCOUNTER — Other Ambulatory Visit (HOSPITAL_COMMUNITY)
Admission: RE | Admit: 2018-02-27 | Discharge: 2018-02-27 | Disposition: A | Discharge status: Home / Self Care | Payer: BLUE CROSS/BLUE SHIELD | Source: Ambulatory Visit | Attending: Obstetrics and Gynecology | Admitting: Obstetrics and Gynecology

## 2018-02-27 DIAGNOSIS — Z124 Encounter for screening for malignant neoplasm of cervix: Secondary | ICD-10-CM

## 2018-02-27 DIAGNOSIS — Z1389 Encounter for screening for other disorder: Secondary | ICD-10-CM | POA: Diagnosis not present

## 2018-02-27 DIAGNOSIS — B3731 Acute candidiasis of vulva and vagina: Secondary | ICD-10-CM

## 2018-02-27 DIAGNOSIS — B373 Candidiasis of vulva and vagina: Secondary | ICD-10-CM

## 2018-02-27 MED ORDER — FLUCONAZOLE 150 MG PO TABS: 150.0000 mg | ORAL_TABLET | Freq: Once | ORAL | 0 refills | Status: AC

## 2018-02-27 MED ORDER — TRIAMCINOLONE ACETONIDE 0.1 % EX CREA: 1.0000 "application " | TOPICAL_CREAM | Freq: Two times a day (BID) | CUTANEOUS | 1 refills | Status: DC

## 2018-02-27 MED ORDER — NORETHINDRONE 0.35 MG PO TABS: 1.0000 | ORAL_TABLET | Freq: Every day | ORAL | 11 refills | Status: DC

## 2018-02-27 NOTE — Progress Notes (Signed)
Postpartum Visit  Chief Complaint:  Chief Complaint  Patient presents with  . Postpartum Care    C/S on 12/16    History of Present Illness: Patient is a 33 y.o. [id number] presents for postpartum visit.  Date of delivery:  Cesarean Section: Elective repeat Pregnancy or labor problems:  no Any problems since the delivery:  no  Newborn Details:  SINGLETON :  1. BabyGender female. Birth weight:   Maternal Details:  Breast or formula feeding: plans to breastfeed Intercourse: No  Contraception after delivery: No  Any bowel or bladder issues: No  Post partum depression/anxiety noted:  no Edinburgh Post-Partum Depression Score:4 Date of last PAP: 2016 NIL and HR HPV negative   Still noting some drainage from incision, was started on Augmentin for possible early cellulitis although appearance was more consistent with possible stitch abscess.   Review of Systems: Review of Systems  Constitutional: Negative.   Gastrointestinal: Negative.   Genitourinary: Negative.   Skin: Positive for itching and rash.    The following portions of the patient's history were reviewed and updated as appropriate: allergies, current medications, past family history, past medical history, past social history, past surgical history and problem list.  Past Medical History:  Past Medical History:  Diagnosis Date  . Anxiety disorder, unspecified    Situational  . Hypothyroidism     Past Surgical History:  Past Surgical History:  Procedure Laterality Date  . BREAST ENHANCEMENT SURGERY  2014  . CESAREAN SECTION N/A 06/01/2016   Procedure: PRIMARY LOW TRANSVERSE CESAREAN SECTION;  Surgeon: [name], MD;  Location: [facility] ORS;  Service: Obstetrics;  Laterality: N/A;  . CESAREAN SECTION N/A 01/16/2018   Procedure: CESAREAN SECTION;  Surgeon: [name], MD;  Location: [facility] ORS;  Service: Obstetrics;  Laterality: N/A;  . COLPOSCOPY  02/25/2012  . LAPAROSCOPIC SALPINGO OOPHERECTOMY Right  01/06/2015   Procedure: LAPAROSCOPIC right SALPINGECTOMYwith removal of ectopic and partial  right corneal resection ;  no oophorectomySurgeon: [name], MD;  Location: [facility] ORS;  Service: Gynecology;  Laterality: Right;  . TONSILLECTOMY  2014    Family History:  Family History  Problem Relation Age of Onset  . Heart disease Mother   . Diabetes Mother   . Hyperlipidemia Mother     Social History:  Social History   Socioeconomic History  . Marital status: Married    Spouse name: Not on file  . Number of children: Not on file  . Years of education: Not on file  . Highest education level: Not on file  Occupational History  . Not on file  Social Needs  . Financial resource strain: Not hard at all  . Food insecurity:    Worry: Never true    Inability: Never true  . Transportation needs:    Medical: No    Non-medical: No  Tobacco Use  . Smoking status: Never Smoker  . Smokeless tobacco: Never Used  Substance and Sexual Activity  . Alcohol use: No    Comment: occas  . Drug use: No  . Sexual activity: Yes    Birth control/protection: None  Lifestyle  . Physical activity:    Days per week: 5 days    Minutes per session: 50 min  . Stress: Not at all  Relationships  . Social connections:    Talks on phone: More than three times a week    Gets together: More than three times a week    Attends religious service: More than 4  times per year    Active member of club or organization: Yes    Attends meetings of clubs or organizations: More than 4 times per year    Relationship status: Married  . Intimate partner violence:    Fear of current or ex partner: No    Emotionally abused: No    Physically abused: No    Forced sexual activity: No  Other Topics Concern  . Not on file  Social History Narrative  . Not on file    Allergies:  Allergies  Allergen Reactions  . Codeine Anaphylaxis    Medications: Prior to Admission medications   Medication Sig Start Date  End Date Taking? Authorizing Provider  Magnesium 250 MG TABS Take 250 mg by mouth 2 (two) times daily.     [provider]  Prenatal Multivit-Min-Fe-FA (PRE-NATAL PO) Take 1 tablet by mouth daily.     [provider]    Physical Exam Blood pressure 102/62, pulse 76, height 5\' 4"  (1.626 m), weight 238 lb (108 kg), currently breastfeeding.  General: NAD HEENT: normocephalic, anicteric Pulmonary: No increased work of breathing Abdomen: NABS, soft, non-tender, non-distended.  Umbilicus without lesions.  No hepatomegaly, splenomegaly or masses palpable. No evidence of hernia. Incision D/C/I.  There is a small area on the right midportion of the incision from which I can express some drainage.  There is no surrounding erythema or fluctuance.  Appearance most consistent with small stitch abscess. Genitourinary:  External: Normal external female genitalia.  Normal urethral meatus, normal  Bartholin's and Skene's glands.    Vagina: Normal vaginal mucosa, no evidence of prolapse.    Cervix: Grossly normal in appearance, no bleeding  Uterus: Non-enlarged, mobile, normal contour.  No CMT  Adnexa: ovaries non-enlarged, no adnexal masses  Rectal: deferred Extremities: no edema, erythema, or tenderness Neurologic: Grossly intact Psychiatric: mood appropriate, affect full  Edinburgh Postnatal Depression Scale - 02/27/18 1021      Edinburgh Postnatal Depression Scale:  In the Past 7 Days   I have been able to laugh and see the funny side of things.  0    I have looked forward with enjoyment to things.  0    I have blamed myself unnecessarily when things went wrong.  1    I have been anxious or worried for no good reason.  1    I have felt scared or panicky for no good reason.  1    Things have been getting on top of me.  1    I have been so unhappy that I have had difficulty sleeping.  0    I have felt sad or miserable.  0    I have been so unhappy that I have been crying.  0     The thought of harming myself has occurred to me.  0    Edinburgh Postnatal Depression Scale Total  4       Assessment: 33 y.o. [id number] presenting for 6 week postpartum visit  Plan: Problem List Items Addressed This Visit    None    Visit Diagnoses    6 weeks postpartum follow-up    -  Primary   Vaginal candida       Relevant Medications   fluconazole (DIFLUCAN) 150 MG tablet   Screening for malignant neoplasm of cervix       Relevant Orders   Cytology - PAP      1) Contraception - Education given regarding options for contraception, as well  as compatibility with breast feeding if applicable.  Patient plans on vasectomy for contraception. - camila rx for now  2)  Pap - ASCCP guidelines and rational discussed.  ASCCP guidelines and rational discussed.  Patient opts for every 3 years screening interval - pap obtained - repeat Rx of diflucan for candida symptoms  3) Patient underwent screening for postpartum depression with no signs of depression  4) Stitch abscess - these are generally steril fluid collections secondary to foreign body reaction to stitch material.  However, if drainage continues we discussed excision with re-closure of the area in question.  5) Return in about 1 year (around 02/28/2019) for annual.    [name], MD, [place], [facility] 02/27/2018, 10:45 AM

## 2018-03-01 LAB — CYTOLOGY - PAP
DIAGNOSIS: NEGATIVE
HPV: NOT DETECTED

## 2018-03-02 NOTE — Telephone Encounter (Signed)
na

## 2018-04-06 ENCOUNTER — Other Ambulatory Visit: Payer: Self-pay | Admitting: Obstetrics and Gynecology

## 2018-04-06 DIAGNOSIS — Z6841 Body Mass Index (BMI) 40.0 and over, adult: Principal | ICD-10-CM

## 2018-04-10 ENCOUNTER — Other Ambulatory Visit: Payer: Self-pay | Admitting: Obstetrics and Gynecology

## 2018-04-10 MED ORDER — DROSPIRENONE-ETHINYL ESTRADIOL 3-0.03 MG PO TABS: 1.0000 | ORAL_TABLET | Freq: Every day | ORAL | 3 refills | Status: DC

## 2018-05-23 ENCOUNTER — Encounter: Payer: Self-pay | Admitting: Obstetrics and Gynecology

## 2018-05-23 ENCOUNTER — Ambulatory Visit (INDEPENDENT_AMBULATORY_CARE_PROVIDER_SITE_OTHER): Payer: BLUE CROSS/BLUE SHIELD | Admitting: Obstetrics and Gynecology

## 2018-05-23 ENCOUNTER — Other Ambulatory Visit: Payer: Self-pay

## 2018-05-23 DIAGNOSIS — F411 Generalized anxiety disorder: Secondary | ICD-10-CM | POA: Diagnosis not present

## 2018-05-23 MED ORDER — BUPROPION HCL ER (XL) 150 MG PO TB24: 150.0000 mg | ORAL_TABLET | Freq: Every day | ORAL | 2 refills | Status: DC

## 2018-05-23 NOTE — Progress Notes (Signed)
I connected with Kellie Davis on 05/24/18 at  3:30 PM EDT by telephone and verified that I am speaking with the correct person using two identifiers.   I discussed the limitations, risks, security and privacy concerns of performing an evaluation and management service by telephone and the availability of in person appointments. I also discussed with the patient that there may be a patient responsible charge related to this service. The patient expressed understanding and agreed to proceed.  The patient was at home I spoke with the patient from my workstation phone The names of people involved in this encounter were: Kellie Davis , and [name]   Obstetrics & Gynecology Office Visit   Chief Complaint:  Chief Complaint  Patient presents with  . Follow-up     medication follow up    History of Present Illness: The patient is a 33 y.o. female presenting initial evaluation for symptoms of anxiety.  The patient is currently taking nothing for the management of her symptoms.  She has had any recent situational stressors.  She reports symptoms of fatigue, anhedonia, insomnia, irritability and visual hallucinations.  She denies feelings of worthlessness, suicidal ideation, homicidal ideation, auditory hallucinations and visual hallucinations. Symptoms have worsened since last visit.     The patient does have a pre-existing history of depression and anxiety.  She  does not a prior history of suicide attempts.  Previous treatment tied include Wellbutrin  Review of Systems: Review of Systems  Constitutional: Positive for malaise/fatigue. Negative for chills, diaphoresis, fever and weight loss.  Cardiovascular: Negative for chest pain.  Gastrointestinal: Negative.   Neurological: Positive for headaches.  Psychiatric/Behavioral: Negative for depression, hallucinations, memory loss, substance abuse and suicidal ideas. The patient is nervous/anxious. The patient does not have insomnia.        Past Medical History:  Past Medical History:  Diagnosis Date  . Anxiety disorder, unspecified    Situational  . Hypothyroidism     Past Surgical History:  Past Surgical History:  Procedure Laterality Date  . BREAST ENHANCEMENT SURGERY  2014  . CESAREAN SECTION N/A 06/01/2016   Procedure: PRIMARY LOW TRANSVERSE CESAREAN SECTION;  Surgeon: [name], MD;  Location: [facility];  Service: Obstetrics;  Laterality: N/A;  . CESAREAN SECTION N/A 01/16/2018   Procedure: CESAREAN SECTION;  Surgeon: [name], MD;  Location: [facility] OR[facility];  Service: Obstetrics;  Laterality: N/A;  . COLPOSCOPY  02/25/2012  . LAPAROSCOPIC SALPINGO OOPHERECTOMY Right 01/06/2015   Procedure: LAPAROSCOPIC right SALPINGECTOMYwith removal of ectopic and partial  right corneal resection ;  no oophorectomySurgeon: [name], MD;  Location: [facility];  Service: Gynecology;  Laterality: Right;  . TONSILLECTOMY  2014    Gynecologic History: Patient's last menstrual period was 05/09/2018 (exact date).  Obstetric History: [id number]  Family History:  Family History  Problem Relation Age of Onset  . Heart disease Mother   . Diabetes Mother   . Hyperlipidemia Mother     Social History:  Social History   Socioeconomic History  . Marital status: Married    Spouse name: Not on file  . Number of children: Not on file  . Years of education: Not on file  . Highest education level: Not on file  Occupational History  . Not on file  Social Needs  . Financial resource strain: Not hard at all  . Food insecurity:    Worry: Never true    Inability: Never true  . Transportation needs:    Medical: No  Non-medical: No  Tobacco Use  . Smoking status: Never Smoker  . Smokeless tobacco: Never Used  Substance and Sexual Activity  . Alcohol use: No    Comment: occas  . Drug use: No  . Sexual activity: Yes    Birth control/protection: Pill  Lifestyle  . Physical activity:    Days per week: 5 days     Minutes per session: 50 min  . Stress: Not at all  Relationships  . Social connections:    Talks on phone: More than three times a week    Gets together: More than three times a week    Attends religious service: More than 4 times per year    Active member of club or organization: Yes    Attends meetings of clubs or organizations: More than 4 times per year    Relationship status: Married  . Intimate partner violence:    Fear of current or ex partner: No    Emotionally abused: No    Physically abused: No    Forced sexual activity: No  Other Topics Concern  . Not on file  Social History Narrative  . Not on file    Allergies:  Allergies  Allergen Reactions  . Codeine Anaphylaxis    Medications: Prior to Admission medications   Medication Sig Start Date End Date Taking? Authorizing Provider  drospirenone-ethinyl estradiol (OCELLA) 3-0.03 MG tablet Take 1 tablet by mouth daily. 04/10/18   [name], MD  Magnesium 250 MG TABS Take 250 mg by mouth 2 (two) times daily.     [provider]  Prenatal Multivit-Min-Fe-FA (PRE-NATAL PO) Take 1 tablet by mouth daily.     [provider]  triamcinolone cream (KENALOG) 0.1 % Apply 1 application topically 2 (two) times daily. 02/27/18   [name], MD    Physical Exam Vitals: There were no vitals filed for this visit. Patient's last menstrual period was 05/09/2018 (exact date).  No physical exam as this was a remote telephone visit to promote social distancing during the current COVID-19 Pandemic       [facility] Visit [facility] 05/23/2018 in [facility]  PHQ-2 Total Score  0     GAD 7 : Generalized Anxiety Score 05/23/2018  Nervous, Anxious, on Edge 1  Control/stop worrying 1  Worry too much - different things 1  Trouble relaxing 2  Restless 2  Easily annoyed or irritable 1  Afraid - awful might happen 0  Total GAD 7 Score 8  Anxiety Difficulty Somewhat difficult     Assessment: 33 y.o.  [id number] follow up generalized anxiety  Plan: Problem List Items Addressed This Visit    None    Visit Diagnoses    Generalized anxiety disorder    -  Primary   Relevant Medications   buPROPion (WELLBUTRIN XL) 150 MG 24 hr tablet      1) Generalized anxiety - fatigue is one of her main complaints. She is not having problems falling asleep but staying asleep so this may also play into her fatigue.  She is concerned about weight gain and has done well in the past with Wellbutrin so we will trial restarting that with plans to rescale in 2 weeks.    2) Thyroid and B12 screen has not been obtained previously  3) Telephone time: 19 min 32 seconds  4) Return in about 2 weeks (around 06/06/2018) for medication follow (Telephone).    [name], MD, [facility], [facility] 05/23/2018,  3:44 PM

## 2018-06-06 ENCOUNTER — Encounter: Payer: Self-pay | Admitting: Obstetrics and Gynecology

## 2018-06-06 ENCOUNTER — Ambulatory Visit (INDEPENDENT_AMBULATORY_CARE_PROVIDER_SITE_OTHER): Payer: BLUE CROSS/BLUE SHIELD | Admitting: Obstetrics and Gynecology

## 2018-06-06 ENCOUNTER — Other Ambulatory Visit: Payer: Self-pay

## 2018-06-06 DIAGNOSIS — F411 Generalized anxiety disorder: Secondary | ICD-10-CM

## 2018-06-06 MED ORDER — BUPROPION HCL ER (XL) 300 MG PO TB24: 300.0000 mg | ORAL_TABLET | Freq: Every day | ORAL | 3 refills | Status: DC

## 2018-06-06 NOTE — Progress Notes (Signed)
I connected with Kellie Davis on 06/06/18 at  2:50 PM EDT by telephone and verified that I am speaking with the correct person using two identifiers.   I discussed the limitations, risks, security and privacy concerns of performing an evaluation and management service by telephone and the availability of in person appointments. I also discussed with the patient that there may be a patient responsible charge related to this service. The patient expressed understanding and agreed to proceed.  The patient was at home I spoke with the patient from my workstation phone The names of people involved in this encounter were: Kellie Davis , and [name]   Obstetrics & Gynecology Office Visit   Chief Complaint:  Chief Complaint  Patient presents with  . Follow-up    Medication    History of Present Illness: The patient is a 33 y.o. female presenting follow up for symptoms of anxiety.  The patient is currently taking Wellbutrin XL 150mg  po daily for the management of her symptoms.  She has not had any recent situational stressors.  She reports symptoms of anhedonia, irritability and visual hallucinations.  She denies insomnia, agorophobia, feelings of guilt, feelings of worthlessness, suicidal ideation, homicidal ideation, auditory hallucinations and visual hallucinations. Symptoms have improved since last visit.     The patient does have a pre-existing history of depression and anxiety.  She  does a prior history of suicide attempts.    Review of Systems: Review of Systems  Constitutional: Negative.   Gastrointestinal: Negative.   Genitourinary: Negative.   Psychiatric/Behavioral: Negative for depression, hallucinations, memory loss, substance abuse and suicidal ideas. The patient is nervous/anxious. The patient does not have insomnia.     Past Medical History:  Past Medical History:  Diagnosis Date  . Anxiety disorder, unspecified    Situational  . Hypothyroidism      Past Surgical History:  Past Surgical History:  Procedure Laterality Date  . BREAST ENHANCEMENT SURGERY  2014  . CESAREAN SECTION N/A 06/01/2016   Procedure: PRIMARY LOW TRANSVERSE CESAREAN SECTION;  Surgeon: [name], MD;  Location: [facility];  Service: Obstetrics;  Laterality: N/A;  . CESAREAN SECTION N/A 01/16/2018   Procedure: CESAREAN SECTION;  Surgeon: [name], MD;  Location: [facility];  Service: Obstetrics;  Laterality: N/A;  . COLPOSCOPY  02/25/2012  . LAPAROSCOPIC SALPINGO OOPHERECTOMY Right 01/06/2015   Procedure: LAPAROSCOPIC right SALPINGECTOMYwith removal of ectopic and partial  right corneal resection ;  no oophorectomySurgeon: [name], MD;  Location: [facility];  Service: Gynecology;  Laterality: Right;  . TONSILLECTOMY  2014    Gynecologic History: Patient's last menstrual period was 05/09/2018 (exact date).  Obstetric History: [id number]  Family History:  Family History  Problem Relation Age of Onset  . Heart disease Mother   . Diabetes Mother   . Hyperlipidemia Mother     Social History:  Social History   Socioeconomic History  . Marital status: Married    Spouse name: Not on file  . Number of children: Not on file  . Years of education: Not on file  . Highest education level: Not on file  Occupational History  . Not on file  Social Needs  . Financial resource strain: Not hard at all  . Food insecurity:    Worry: Never true    Inability: Never true  . Transportation needs:    Medical: No    Non-medical: No  Tobacco Use  . Smoking status: Never Smoker  . Smokeless tobacco:  Never Used  Substance and Sexual Activity  . Alcohol use: No    Comment: occas  . Drug use: No  . Sexual activity: Yes    Birth control/protection: Pill  Lifestyle  . Physical activity:    Days per week: 5 days    Minutes per session: 50 min  . Stress: Not at all  Relationships  . Social connections:    Talks on phone: More than three times a week     Gets together: More than three times a week    Attends religious service: More than 4 times per year    Active member of club or organization: Yes    Attends meetings of clubs or organizations: More than 4 times per year    Relationship status: Married  . Intimate partner violence:    Fear of current or ex partner: No    Emotionally abused: No    Physically abused: No    Forced sexual activity: No  Other Topics Concern  . Not on file  Social History Narrative  . Not on file    Allergies:  Allergies  Allergen Reactions  . Codeine Anaphylaxis    Medications: Prior to Admission medications   Medication Sig Start Date End Date Taking? Authorizing Provider  buPROPion (WELLBUTRIN XL) 150 MG 24 hr tablet Take 1 tablet (150 mg total) by mouth daily. 05/23/18   [name], MD  drospirenone-ethinyl estradiol (OCELLA) 3-0.03 MG tablet Take 1 tablet by mouth daily. 04/10/18   [name], MD  Magnesium 250 MG TABS Take 250 mg by mouth 2 (two) times daily.     [provider]    Physical Exam Vitals: There were no vitals filed for this visit. Patient's last menstrual period was 05/09/2018 (exact date).  No physical exam as this was a remote telephone visit to promote social distancing during the current COVID-19 Pandemic  GAD 7 : Generalized Anxiety Score 05/23/2018  Nervous, Anxious, on Edge 1  Control/stop worrying 1  Worry too much - different things 1  Trouble relaxing 2  Restless 2  Easily annoyed or irritable 1  Afraid - awful might happen 0  Total GAD 7 Score 8  Anxiety Difficulty Somewhat difficult    Depression screen [facility] 2/9 05/23/2018  Decreased Interest 0  Down, Depressed, Hopeless 0  PHQ - 2 Score 0    Depression screen PHQ 2/9 05/23/2018  Decreased Interest 0  Down, Depressed, Hopeless 0  PHQ - 2 Score 0     Assessment: 33 y.o. [id number]   Plan: Problem List Items Addressed This Visit    None    Visit Diagnoses    Generalized anxiety  disorder    -  Primary   Relevant Medications   buPROPion (WELLBUTRIN XL) 300 MG 24 hr tablet      1) Anxiety - improvement in fatigue and sleeping.  4lbs weight loss in last 2 weeks.  Increase Wellbutrin dose to 300mg  daily  2) Thyroid and B12 screen has been obtained previously  3) Telephone time 11 minutes  4) Return in about 3 months (around 09/06/2018) for 8-12 weeks.   [name], MD, [facility], [facility] 06/06/2018, 5:17 PM

## 2018-07-20 ENCOUNTER — Other Ambulatory Visit: Payer: Self-pay | Admitting: Obstetrics and Gynecology

## 2018-07-20 MED ORDER — FLUCONAZOLE 150 MG PO TABS: 150.0000 mg | ORAL_TABLET | Freq: Once | ORAL | 0 refills | Status: AC

## 2018-09-06 ENCOUNTER — Other Ambulatory Visit: Payer: Self-pay

## 2018-09-06 ENCOUNTER — Ambulatory Visit (INDEPENDENT_AMBULATORY_CARE_PROVIDER_SITE_OTHER): Payer: BLUE CROSS/BLUE SHIELD | Admitting: Obstetrics and Gynecology

## 2018-09-06 VITALS — Wt 237.0 lb

## 2018-09-06 DIAGNOSIS — F411 Generalized anxiety disorder: Secondary | ICD-10-CM

## 2018-09-06 DIAGNOSIS — Z6841 Body Mass Index (BMI) 40.0 and over, adult: Secondary | ICD-10-CM | POA: Diagnosis not present

## 2018-09-06 MED ORDER — PHENTERMINE HCL 37.5 MG PO TABS: 37.5000 mg | ORAL_TABLET | Freq: Every day | ORAL | 0 refills | Status: DC

## 2018-09-06 MED ORDER — BUPROPION HCL ER (XL) 300 MG PO TB24: 300.0000 mg | ORAL_TABLET | Freq: Every day | ORAL | 11 refills | Status: DC

## 2018-09-06 NOTE — Progress Notes (Signed)
I connected with Kellie Davis on 09/11/18 at  8:30 AM EDT by telephone and verified that I am speaking with the correct person using two identifiers.   I discussed the limitations, risks, security and privacy concerns of performing an evaluation and management service by telephone and the availability of in person appointments. I also discussed with the patient that there may be a patient responsible charge related to this service. The patient expressed understanding and agreed to proceed.  The patient was at home I spoke with the patient from my workstation phone The names of people involved in this encounter were: Kellie Davis , and [facility] Office Visit   Chief Complaint: No chief complaint on file.   History of Present Illness: 33 y.o. [id number] presenting for medication follow up for a diagnosis of generalized anxiety.  She is currently being managed with Wellbutrin XL 300mg .   The patient reports good control of symptoms on her current regimen.  Happy with results thus far.   She has not noted any side-effects or new symptoms.  She is also interested in restarting medical weight loss again.  Review of Systems: Review of Systems  All other systems reviewed and are negative.   Past Medical History:  Past Medical History:  Diagnosis Date  . Anxiety disorder, unspecified    Situational  . Hypothyroidism     Past Surgical History:  Past Surgical History:  Procedure Laterality Date  . BREAST ENHANCEMENT SURGERY  2014  . CESAREAN SECTION N/A 06/01/2016   Procedure: PRIMARY LOW TRANSVERSE CESAREAN SECTION;  Surgeon: [name], MD;  Location: [facility] OR[facility];  Service: Obstetrics;  Laterality: N/A;  . CESAREAN SECTION N/A 01/16/2018   Procedure: CESAREAN SECTION;  Surgeon: [name], MD;  Location: [facility] OR[facility];  Service: Obstetrics;  Laterality: N/A;  . COLPOSCOPY  02/25/2012  . LAPAROSCOPIC SALPINGO OOPHERECTOMY Right 01/06/2015   Procedure: LAPAROSCOPIC right SALPINGECTOMYwith removal of ectopic and partial  right corneal resection ;  no oophorectomySurgeon: [name], MD;  Location: [facility];  Service: Gynecology;  Laterality: Right;  . TONSILLECTOMY  2014    Gynecologic History: No LMP recorded.  Obstetric History: [id number]  Family History:  Family History  Problem Relation Age of Onset  . Heart disease Mother   . Diabetes Mother   . Hyperlipidemia Mother     Social History:  Social History   Socioeconomic History  . Marital status: Married    Spouse name: Not on file  . Number of children: Not on file  . Years of education: Not on file  . Highest education level: Not on file  Occupational History  . Not on file  Social Needs  . Financial resource strain: Not hard at all  . Food insecurity    Worry: Never true    Inability: Never true  . Transportation needs    Medical: No    Non-medical: No  Tobacco Use  . Smoking status: Never Smoker  . Smokeless tobacco: Never Used  Substance and Sexual Activity  . Alcohol use: No    Comment: occas  . Drug use: No  . Sexual activity: Yes    Birth control/protection: Pill  Lifestyle  . Physical activity    Days per week: 5 days    Minutes per session: 50 min  . Stress: Not at all  Relationships  . Social connections    Talks on phone: More than three times a week    Gets together:  More than three times a week    Attends religious service: More than 4 times per year    Active member of club or organization: Yes    Attends meetings of clubs or organizations: More than 4 times per year    Relationship status: Married  . Intimate partner violence    Fear of current or ex partner: No    Emotionally abused: No    Physically abused: No    Forced sexual activity: No  Other Topics Concern  . Not on file  Social History Narrative  . Not on file    Allergies:  Allergies  Allergen Reactions  . Codeine Anaphylaxis    Medications: Prior  to Admission medications   Medication Sig Start Date End Date Taking? Authorizing Provider  buPROPion (WELLBUTRIN XL) 300 MG 24 hr tablet Take 1 tablet (300 mg total) by mouth daily. 09/06/18   [name], MD  drospirenone-ethinyl estradiol (OCELLA) 3-0.03 MG tablet Take 1 tablet by mouth daily. 04/10/18   [name], MD  Magnesium 250 MG TABS Take 250 mg by mouth 2 (two) times daily.     [provider]  phentermine (ADIPEX-P) 37.5 MG tablet Take 1 tablet (37.5 mg total) by mouth daily before breakfast. 09/06/18        Physical Exam Vitals: Weight 237 lb (107.5 kg), not currently breastfeeding. Body mass index is 40.68 kg/m.   No physical exam as this was a remote telephone visit to promote social distancing during the current COVID-19 Pandemic  Assessment: 33 y.o. [id number] follow up generalized anxiety, restart medical weight loss  Plan: Problem List Items Addressed This Visit    None    Visit Diagnoses    Generalized anxiety disorder    -  Primary   Relevant Medications   buPROPion (WELLBUTRIN XL) 300 MG 24 hr tablet   Class 3 severe obesity without serious comorbidity with body mass index (BMI) of 40.0 to 44.9 in adult, unspecified obesity type (HCC)       Relevant Medications   phentermine (ADIPEX-P) 37.5 MG tablet     1) Generalized anxiety - Continue wellbutrin at current dose  2) Medical weight loss - Restart phentermine  - Current weight 237lbs  3) Telephone Time 15 minue 42 second phone visit  [name], MD, [facility], [place]

## 2018-09-29 ENCOUNTER — Other Ambulatory Visit: Payer: Self-pay

## 2018-09-29 MED ORDER — BUPROPION HCL ER (XL) 300 MG PO TB24: 300.0000 mg | ORAL_TABLET | Freq: Every day | ORAL | 3 refills | Status: DC

## 2018-10-06 ENCOUNTER — Other Ambulatory Visit: Payer: Self-pay

## 2018-10-06 MED ORDER — PHENTERMINE HCL 37.5 MG PO TABS: 37.5000 mg | ORAL_TABLET | Freq: Every day | ORAL | 0 refills | Status: DC

## 2018-10-07 ENCOUNTER — Other Ambulatory Visit: Payer: Self-pay | Admitting: Obstetrics and Gynecology

## 2018-10-07 MED ORDER — PHENTERMINE HCL 37.5 MG PO TABS: 37.5000 mg | ORAL_TABLET | Freq: Every day | ORAL | 0 refills | Status: DC

## 2018-10-07 NOTE — Progress Notes (Signed)
Weight down to 228lbs continue phentermine, 10lbs weight loss

## 2018-10-12 NOTE — Telephone Encounter (Signed)
Received another PA request for Phentermine. LVM for patient to call back with an update on wether she paid out of pocket for medication.

## 2018-10-17 ENCOUNTER — Other Ambulatory Visit: Payer: Self-pay | Admitting: Obstetrics and Gynecology

## 2018-10-17 MED ORDER — FLUCONAZOLE 150 MG PO TABS: 150.0000 mg | ORAL_TABLET | Freq: Once | ORAL | 0 refills | Status: AC

## 2018-11-10 ENCOUNTER — Other Ambulatory Visit: Payer: Self-pay | Admitting: Obstetrics and Gynecology

## 2018-11-10 MED ORDER — PHENTERMINE HCL 37.5 MG PO TABS: 37.5000 mg | ORAL_TABLET | Freq: Every day | ORAL | 0 refills | Status: DC

## 2018-11-10 NOTE — Progress Notes (Signed)
Current weight 222lbs weight loss of 15lbs continue phentermine

## 2018-11-15 ENCOUNTER — Other Ambulatory Visit: Payer: Self-pay

## 2018-11-15 DIAGNOSIS — Z20822 Contact with and (suspected) exposure to covid-19: Secondary | ICD-10-CM

## 2018-11-16 ENCOUNTER — Other Ambulatory Visit: Payer: Self-pay | Admitting: Obstetrics and Gynecology

## 2018-11-17 LAB — NOVEL CORONAVIRUS, NAA: SARS-CoV-2, NAA: NOT DETECTED

## 2018-12-22 ENCOUNTER — Other Ambulatory Visit: Payer: Self-pay | Admitting: Obstetrics and Gynecology

## 2018-12-22 MED ORDER — PHENTERMINE HCL 37.5 MG PO TABS: 37.5000 mg | ORAL_TABLET | Freq: Every day | ORAL | 0 refills | Status: DC

## 2018-12-22 NOTE — Progress Notes (Signed)
Weight down to 21lbs continue phentermine 37.5mg 

## 2019-01-31 ENCOUNTER — Other Ambulatory Visit: Payer: Self-pay | Admitting: Obstetrics and Gynecology

## 2019-01-31 MED ORDER — PHENTERMINE HCL 37.5 MG PO TABS: 37.5000 mg | ORAL_TABLET | Freq: Every day | ORAL | 0 refills | Status: DC

## 2019-03-09 ENCOUNTER — Other Ambulatory Visit: Payer: Self-pay | Admitting: Obstetrics and Gynecology

## 2019-03-22 ENCOUNTER — Other Ambulatory Visit: Payer: Self-pay | Admitting: Obstetrics and Gynecology

## 2019-03-22 MED ORDER — PHENTERMINE HCL 37.5 MG PO TABS: 37.5000 mg | ORAL_TABLET | Freq: Every day | ORAL | 0 refills | Status: DC

## 2019-03-22 NOTE — Progress Notes (Signed)
Current weight down to 197lbs continue phentermine

## 2019-03-23 ENCOUNTER — Other Ambulatory Visit: Payer: Self-pay | Admitting: Obstetrics and Gynecology

## 2019-03-23 MED ORDER — FLUCONAZOLE 150 MG PO TABS: 150.0000 mg | ORAL_TABLET | Freq: Once | ORAL | 0 refills | Status: AC

## 2019-04-03 ENCOUNTER — Other Ambulatory Visit: Payer: Self-pay | Admitting: Obstetrics and Gynecology

## 2019-04-03 DIAGNOSIS — Z1239 Encounter for other screening for malignant neoplasm of breast: Secondary | ICD-10-CM

## 2019-04-03 DIAGNOSIS — T8543XA Leakage of breast prosthesis and implant, initial encounter: Secondary | ICD-10-CM

## 2019-04-10 ENCOUNTER — Ambulatory Visit
Admission: RE | Admit: 2019-04-10 | Discharge: 2019-04-10 | Disposition: A | Discharge status: Home / Self Care | Payer: BC Managed Care – PPO | Source: Ambulatory Visit | Attending: Obstetrics and Gynecology | Admitting: Obstetrics and Gynecology

## 2019-04-10 ENCOUNTER — Other Ambulatory Visit: Payer: Self-pay | Admitting: Obstetrics and Gynecology

## 2019-04-10 DIAGNOSIS — X58XXXA Exposure to other specified factors, initial encounter: Secondary | ICD-10-CM | POA: Diagnosis not present

## 2019-04-10 DIAGNOSIS — R928 Other abnormal and inconclusive findings on diagnostic imaging of breast: Secondary | ICD-10-CM | POA: Insufficient documentation

## 2019-04-10 DIAGNOSIS — T8543XA Leakage of breast prosthesis and implant, initial encounter: Secondary | ICD-10-CM

## 2019-04-10 DIAGNOSIS — Z1239 Encounter for other screening for malignant neoplasm of breast: Secondary | ICD-10-CM

## 2019-04-10 DIAGNOSIS — T8549XA Other mechanical complication of breast prosthesis and implant, initial encounter: Secondary | ICD-10-CM | POA: Insufficient documentation

## 2019-04-10 DIAGNOSIS — N6310 Unspecified lump in the right breast, unspecified quadrant: Secondary | ICD-10-CM

## 2019-04-10 DIAGNOSIS — Z1231 Encounter for screening mammogram for malignant neoplasm of breast: Secondary | ICD-10-CM | POA: Insufficient documentation

## 2019-04-16 NOTE — Telephone Encounter (Signed)
Can we look into this?

## 2019-04-20 ENCOUNTER — Ambulatory Visit
Admission: RE | Admit: 2019-04-20 | Discharge: 2019-04-20 | Disposition: A | Discharge status: Home / Self Care | Payer: BC Managed Care – PPO | Source: Ambulatory Visit | Attending: Obstetrics and Gynecology | Admitting: Obstetrics and Gynecology

## 2019-04-20 DIAGNOSIS — R928 Other abnormal and inconclusive findings on diagnostic imaging of breast: Secondary | ICD-10-CM

## 2019-04-20 DIAGNOSIS — N6310 Unspecified lump in the right breast, unspecified quadrant: Secondary | ICD-10-CM | POA: Diagnosis present

## 2019-04-23 ENCOUNTER — Ambulatory Visit: Payer: BC Managed Care – PPO

## 2019-05-08 NOTE — Telephone Encounter (Signed)
I'm going to allow you to refill this since its controlled and I don't know this patient.

## 2019-05-15 ENCOUNTER — Telehealth: Payer: Self-pay | Admitting: Obstetrics and Gynecology

## 2019-05-15 NOTE — Telephone Encounter (Signed)
Patient is schedule for 05/25/19 at 4:30 for medication follow up. Patient is requesting CBC lab order due to weight loss

## 2019-05-15 NOTE — Telephone Encounter (Signed)
Needs a quick phone visit vs in person for follow up

## 2019-05-19 ENCOUNTER — Other Ambulatory Visit: Payer: Self-pay | Admitting: Obstetrics and Gynecology

## 2019-05-19 DIAGNOSIS — Z13220 Encounter for screening for lipoid disorders: Secondary | ICD-10-CM

## 2019-05-19 DIAGNOSIS — Z131 Encounter for screening for diabetes mellitus: Secondary | ICD-10-CM

## 2019-05-19 NOTE — Telephone Encounter (Signed)
Future orders are in.

## 2019-05-21 NOTE — Telephone Encounter (Signed)
Lab scheduled °

## 2019-05-22 ENCOUNTER — Other Ambulatory Visit: Payer: Self-pay

## 2019-05-22 ENCOUNTER — Other Ambulatory Visit: Payer: BC Managed Care – PPO

## 2019-05-22 DIAGNOSIS — Z131 Encounter for screening for diabetes mellitus: Secondary | ICD-10-CM | POA: Diagnosis not present

## 2019-05-22 DIAGNOSIS — Z13220 Encounter for screening for lipoid disorders: Secondary | ICD-10-CM

## 2019-05-23 LAB — LIPID PANEL
Chol/HDL Ratio: 3 ratio (ref 0.0–4.4)
Cholesterol, Total: 204 mg/dL — ABNORMAL HIGH (ref 100–199)
HDL: 69 mg/dL (ref >39)
LDL Chol Calc (NIH): 118 mg/dL — ABNORMAL HIGH (ref 0–99)
Triglycerides: 98 mg/dL (ref 0–149)
VLDL Cholesterol Cal: 17 mg/dL (ref 5–40)

## 2019-05-23 LAB — HEMOGLOBIN A1C
Est. average glucose Bld gHb Est-mCnc: 82 mg/dL
Hgb A1c MFr Bld: 4.5 % — ABNORMAL LOW (ref 4.8–5.6)

## 2019-05-25 ENCOUNTER — Other Ambulatory Visit: Payer: Self-pay

## 2019-05-25 ENCOUNTER — Ambulatory Visit (INDEPENDENT_AMBULATORY_CARE_PROVIDER_SITE_OTHER): Payer: BC Managed Care – PPO | Admitting: Obstetrics and Gynecology

## 2019-05-25 ENCOUNTER — Encounter: Payer: Self-pay | Admitting: Obstetrics and Gynecology

## 2019-05-25 VITALS — Ht 64.0 in | Wt 186.0 lb

## 2019-05-25 DIAGNOSIS — Z6831 Body mass index (BMI) 31.0-31.9, adult: Secondary | ICD-10-CM | POA: Diagnosis not present

## 2019-05-25 DIAGNOSIS — E669 Obesity, unspecified: Secondary | ICD-10-CM | POA: Diagnosis not present

## 2019-05-25 MED ORDER — PHENTERMINE HCL 37.5 MG PO TABS: 37.5000 mg | ORAL_TABLET | Freq: Every day | ORAL | 0 refills | Status: DC

## 2019-05-25 NOTE — Progress Notes (Signed)
I connected with Kellie Davis on 06/01/19 at  4:30 PM EDT by telephone and verified that I am speaking with the correct person using two identifiers.   I discussed the limitations, risks, security and privacy concerns of performing an evaluation and management service by telephone and the availability of in person appointments. I also discussed with the patient that there may be a patient responsible charge related to this service. The patient expressed understanding and agreed to proceed.  The patient was at home I spoke with the patient from my workstation phone The names of people involved in this encounter were: Kellie Davis , and [name]   Gynecology Office Visit  Chief Complaint:  Chief Complaint  Patient presents with  . Weight Check    History of Present Illness:  Patientis a 34 y.o. [id number] female, who presents for the evaluation of weight gain. She has lost almost 50lbs in the past 6 months but underwent recent replacement of breast implants following and unexpected rupture.   Has gained approximately 5lbs in the past month largely due to decreased mobility in the postoperative period. The patient has no additional symptoms. The patient specifically denies memory loss, muscle weakness, excessive thirst, and polyuria. Weight related co-morbidities include none. The patient's past medical history is notable for none. She has tried phentermine interventions in the past with good success.  Continues on intermittent fasting diet  Review of Systems: 10 point review of systems negative unless otherwise noted in HPI  Past Medical History:  Past Medical History:  Diagnosis Date  . Anxiety disorder, unspecified    Situational  . Hypothyroidism     Past Surgical History:  Past Surgical History:  Procedure Laterality Date  . AUGMENTATION MAMMAPLASTY Bilateral 2013   known rupture left, to be replaced 04/25/2019  . BREAST ENHANCEMENT SURGERY  2014  . CESAREAN  SECTION N/A 06/01/2016   Procedure: PRIMARY LOW TRANSVERSE CESAREAN SECTION;  Surgeon: [name], MD;  Location: [facility];  Service: Obstetrics;  Laterality: N/A;  . CESAREAN SECTION N/A 01/16/2018   Procedure: CESAREAN SECTION;  Surgeon: [name], MD;  Location: [facility];  Service: Obstetrics;  Laterality: N/A;  . COLPOSCOPY  02/25/2012  . LAPAROSCOPIC SALPINGO OOPHERECTOMY Right 01/06/2015   Procedure: LAPAROSCOPIC right SALPINGECTOMYwith removal of ectopic and partial  right corneal resection ;  no oophorectomySurgeon: [name], MD;  Location: [facility];  Service: Gynecology;  Laterality: Right;  . TONSILLECTOMY  2014    Gynecologic History: Patient's last menstrual period was 04/29/2019 (exact date).  Obstetric History: [id number]  Family History:  Family History  Problem Relation Age of Onset  . Heart disease Mother   . Diabetes Mother   . Hyperlipidemia Mother   . Breast cancer Mother 49    Social History:  Social History   Socioeconomic History  . Marital status: Married    Spouse name: Not on file  . Number of children: Not on file  . Years of education: Not on file  . Highest education level: Not on file  Occupational History  . Not on file  Tobacco Use  . Smoking status: Never Smoker  . Smokeless tobacco: Never Used  Substance and Sexual Activity  . Alcohol use: No    Comment: occas  . Drug use: No  . Sexual activity: Yes    Birth control/protection: Pill  Other Topics Concern  . Not on file  Social History Narrative  . Not on file   Social Determinants  of Health   Financial Resource Strain:   . Difficulty of Paying Living Expenses:   Food Insecurity:   . Worried About Charity fundraiser in the Last Year:   . Arboriculturist in the Last Year:   Transportation Needs:   . Film/video editor (Medical):   Marland Kitchen Lack of Transportation (Non-Medical):   Physical Activity:   . Days of Exercise per Week:   . Minutes of Exercise per  Session:   Stress:   . Feeling of Stress :   Social Connections:   . Frequency of Communication with Friends and Family:   . Frequency of Social Gatherings with Friends and Family:   . Attends Religious Services:   . Active Member of Clubs or Organizations:   . Attends Archivist Meetings:   Marland Kitchen Marital Status:   Intimate Partner Violence:   . Fear of Current or Ex-Partner:   . Emotionally Abused:   Marland Kitchen Physically Abused:   . Sexually Abused:     Allergies:  Allergies  Allergen Reactions  . Codeine Anaphylaxis    Medications: Prior to Admission medications   Medication Sig Start Date End Date Taking? Authorizing Provider  buPROPion (WELLBUTRIN XL) 300 MG 24 hr tablet Take 1 tablet (300 mg total) by mouth daily. 09/29/18  Yes [name], MD  Magnesium 250 MG TABS Take 250 mg by mouth 2 (two) times daily.    Yes [provider]  phentermine (ADIPEX-P) 37.5 MG tablet Take 1 tablet (37.5 mg total) by mouth daily before breakfast. 03/22/19  Yes [name], MD  SYEDA 3-0.03 MG tablet TAKE 1 TABLET BY MOUTH DAILY 03/09/19  Yes [name], MD    Physical Exam Height 5\' 4"  (1.626 m), weight 186 lb (84.4 kg), last menstrual period 04/29/2019, not currently breastfeeding. Wt Readings from Last 3 Encounters:  05/25/19 186 lb (84.4 kg)  09/11/18 237 lb (107.5 kg)  02/27/18 238 lb (108 kg)  Body mass index is 31.93 kg/m.  No physical exam as this was a remote telephone visit to promote social distancing during the current COVID-19 Pandemic  Assessment: 34 y.o. [id number] medical weight loss   Plan: Problem List Items Addressed This Visit    None    Visit Diagnoses    Class 1 obesity with serious comorbidity and body mass index (BMI) of 31.0 to 31.9 in adult, unspecified obesity type    -  Primary   Relevant Medications   phentermine (ADIPEX-P) 37.5 MG tablet      1) 1500 Calorie ADA Diet  2) Patient education given regarding appropriate  lifestyle changes for weight loss including: regular physical activity, healthy coping strategies, caloric restriction and healthy eating patterns.  3) Rx phentermine.  The pros and cons of suppressing appetite and boosting metabolism is discussed. Risks of tolerence and addiction is discussed for selected agents discussed. Use of medicine will ne short term, such as 3-4 months at a time followed by a period of time off of the medicine to avoid these risks and side effects for Adipex, Qsymia, and Tenuate discussed. Pt to call with any negative side effects and agrees to keep follow up appts.  4) Comorbidity Screening - hypothyroidism screening, diabetes, and hyperlipidemia screening not offered  5) Telephone time 12:02 min  6) Follow up in 4 weeks to assess response    [name], MD, [place] 05/25/2019, 5:05 PM

## 2019-05-31 ENCOUNTER — Other Ambulatory Visit: Payer: Self-pay | Admitting: Obstetrics and Gynecology

## 2019-06-27 ENCOUNTER — Other Ambulatory Visit: Payer: Self-pay | Admitting: Obstetrics and Gynecology

## 2019-06-27 MED ORDER — PHENTERMINE HCL 37.5 MG PO TABS: 37.5000 mg | ORAL_TABLET | Freq: Every day | ORAL | 0 refills | Status: DC

## 2019-06-27 NOTE — Progress Notes (Signed)
Current weight 187lbs down 50lbs from August 2020.

## 2019-07-23 ENCOUNTER — Other Ambulatory Visit: Payer: Self-pay | Admitting: Obstetrics and Gynecology

## 2019-07-23 NOTE — Telephone Encounter (Signed)
advise

## 2019-09-19 NOTE — Telephone Encounter (Signed)
Phone or in person visit in the next 1-2 weeks

## 2019-09-20 NOTE — Telephone Encounter (Signed)
Called and left voicemail for patient to call back to be scheduled. 

## 2019-10-01 ENCOUNTER — Ambulatory Visit (INDEPENDENT_AMBULATORY_CARE_PROVIDER_SITE_OTHER): Payer: BC Managed Care – PPO | Admitting: Obstetrics and Gynecology

## 2019-10-01 ENCOUNTER — Other Ambulatory Visit: Payer: Self-pay

## 2019-10-01 ENCOUNTER — Encounter: Payer: Self-pay | Admitting: Obstetrics and Gynecology

## 2019-10-01 VITALS — Wt 192.0 lb

## 2019-10-01 DIAGNOSIS — Z6832 Body mass index (BMI) 32.0-32.9, adult: Secondary | ICD-10-CM

## 2019-10-01 DIAGNOSIS — E669 Obesity, unspecified: Secondary | ICD-10-CM | POA: Diagnosis not present

## 2019-10-01 MED ORDER — DIETHYLPROPION HCL ER 75 MG PO TB24: 1.0000 | ORAL_TABLET | Freq: Every day | ORAL | 0 refills | Status: DC

## 2019-10-01 MED ORDER — TRIAMCINOLONE ACETONIDE 0.1 % EX CREA: 1.0000 "application " | TOPICAL_CREAM | Freq: Two times a day (BID) | CUTANEOUS | 1 refills | Status: DC

## 2019-10-01 NOTE — Progress Notes (Signed)
I connected with Kellie Davis on 10/01/19 at  2:10 PM EDT by telephone and verified that I am speaking with the correct person using two identifiers.   I discussed the limitations, risks, security and privacy concerns of performing an evaluation and management service by telephone and the availability of in person appointments. I also discussed with the patient that there may be a patient responsible charge related to this service. The patient expressed understanding and agreed to proceed.  The patient was at home. I spoke with the patient from my workstation phoneThe names of people involved in this encounter were: Kellie Davis , and Kellie Davis   Gynecology Office Visit  Chief Complaint:  Chief Complaint  Patient presents with  . Weight Check    History of Present Illness: Patientis a 34 y.o. [id number] female, who presents for the evaluation of weight gain. She has gained 10 pounds primarily over 3 months. The patient states the following issues have contributed to her weight problem: has traveled more which was somewhat disruptive to her dieting routine.  The patient has no additional symptoms. The patient specifically denies memory loss, muscle weakness, excessive thirst, and polyuria. Weight related co-morbidities include none. The patient's past medical history is notable for none. She has tried phentermine interventions in the past with good success.   Review of Systems: 10 point review of systems negative unless otherwise noted in HPI  Past Medical History:  Patient Active Problem List   Diagnosis Date Noted  . Uterine scar from previous cesarean delivery affecting pregnancy 01/16/2018  . Avitaminosis D 04/29/2016  . Hypercholesterolemia without hypertriglyceridemia 04/29/2016  . History of hypothyroidism 03/30/2016    Past Surgical History:  Past Surgical History:  Procedure Laterality Date  . AUGMENTATION MAMMAPLASTY Bilateral 2013   known rupture left, to be  replaced 04/25/2019  . BREAST ENHANCEMENT SURGERY  2014  . CESAREAN SECTION N/A 06/01/2016   Procedure: PRIMARY LOW TRANSVERSE CESAREAN SECTION;  Surgeon: Kellie Mood, MD;  Location: [facility];  Service: Obstetrics;  Laterality: N/A;  . CESAREAN SECTION N/A 01/16/2018   Procedure: CESAREAN SECTION;  Surgeon: Kellie Mood, MD;  Location: [facility];  Service: Obstetrics;  Laterality: N/A;  . COLPOSCOPY  02/25/2012  . LAPAROSCOPIC SALPINGO OOPHERECTOMY Right 01/06/2015   Procedure: LAPAROSCOPIC right SALPINGECTOMYwith removal of ectopic and partial  right corneal resection ;  no oophorectomySurgeon: Kellie Mood, MD;  Location: [facility];  Service: Gynecology;  Laterality: Right;  . TONSILLECTOMY  2014    Gynecologic History: Patient's last menstrual period was 09/17/2019.  Obstetric History: [id number]  Family History:  Family History  Problem Relation Age of Onset  . Heart disease Mother   . Diabetes Mother   . Hyperlipidemia Mother   . Breast cancer Mother 26    Social History:  Social History   Socioeconomic History  . Marital status: Married    Spouse name: Not on file  . Number of children: Not on file  . Years of education: Not on file  . Highest education level: Not on file  Occupational History  . Not on file  Tobacco Use  . Smoking status: Never Smoker  . Smokeless tobacco: Never Used  Vaping Use  . Vaping Use: Never used  Substance and Sexual Activity  . Alcohol use: No    Comment: occas  . Drug use: No  . Sexual activity: Yes    Birth control/protection: Pill  Other Topics Concern  . Not on file  Social History Narrative  .  Not on file   Social Determinants of Health   Financial Resource Strain:   . Difficulty of Paying Living Expenses: Not on file  Food Insecurity:   . Worried About Charity fundraiser in the Last Year: Not on file  . Ran Out of Food in the Last Year: Not on file  Transportation Needs:   . Lack of Transportation (Medical):  Not on file  . Lack of Transportation (Non-Medical): Not on file  Physical Activity:   . Days of Exercise per Week: Not on file  . Minutes of Exercise per Session: Not on file  Stress:   . Feeling of Stress : Not on file  Social Connections:   . Frequency of Communication with Friends and Family: Not on file  . Frequency of Social Gatherings with Friends and Family: Not on file  . Attends Religious Services: Not on file  . Active Member of Clubs or Organizations: Not on file  . Attends Archivist Meetings: Not on file  . Marital Status: Not on file  Intimate Partner Violence:   . Fear of Current or Ex-Partner: Not on file  . Emotionally Abused: Not on file  . Physically Abused: Not on file  . Sexually Abused: Not on file    Allergies:  Allergies  Allergen Reactions  . Codeine Anaphylaxis    Medications: Prior to Admission medications   Medication Sig Start Date End Date Taking? Authorizing Provider  buPROPion (WELLBUTRIN XL) 300 MG 24 hr tablet Take 1 tablet (300 mg total) by mouth daily. 09/29/18  Yes Kellie Mood, MD  drospirenone-ethinyl estradiol (YASMIN) 3-0.03 MG tablet TAKE 1 TABLET BY MOUTH DAILY 07/24/19  Yes Kellie Mood, MD  Magnesium 250 MG TABS Take 250 mg by mouth 2 (two) times daily.    Yes [provider]  phentermine (ADIPEX-P) 37.5 MG tablet Take 1 tablet (37.5 mg total) by mouth daily before breakfast. Patient not taking: Reported on 10/01/2019 06/27/19   Kellie Mood, MD    Physical Exam Weight 192 lb (87.1 kg), last menstrual period 09/17/2019, not currently breastfeeding. Patient's last menstrual period was 09/17/2019. Body mass index is 32.96 kg/m.  No physical exam as this was a remote telephone visit to promote social distancing during the current COVID-19 Pandemic  Assessment: 34 y.o. [id number] presenting for discussion of weight loss management options  Plan: Problem List Items Addressed This Visit    None      Visit Diagnoses    Class 1 obesity without serious comorbidity with body mass index (BMI) of 32.0 to 32.9 in adult, unspecified obesity type    -  Primary      1) 1500 Calorie ADA Diet  2) Patient education given regarding appropriate lifestyle changes for weight loss including: regular physical activity, healthy coping strategies, caloric restriction and healthy eating patterns.  3) Patient will be started on weight loss medication. The risks and benefits and side effects of medication, such as Adipex (Phenteramine) ,  Tenuate (Diethylproprion), Belviq (lorcarsin), Contrave (buproprion/naltrexone), Qsymia (phentermine/topiramate), and Saxenda (liraglutide) is discussed. The pros and cons of suppressing appetite and boosting metabolism is discussed. Risks of tolerence and addiction is discussed for selected agents discussed. Use of medicine will ne short term, such as 3-4 months at a time followed by a period of time off of the medicine to avoid these risks and side effects for Adipex, Qsymia, and Tenuate discussed. Pt to call with any negative side effects and agrees to keep follow up appts. -  start diethylpropion  4) Comorbidity Screening - hypothyroidism screening, diabetes, and hyperlipidemia screening offered  5) Encouraged weekly weight monitorig to track progress and sample 1 week food diary  7) GOAL Wt 165lbs   7) Telephone time 14:35min  8) No follow-ups on file.   Kellie Mood, MD, [facility], [facility] 10/01/2019, 2:40 PM

## 2019-10-02 ENCOUNTER — Other Ambulatory Visit: Payer: Self-pay | Admitting: Obstetrics and Gynecology

## 2019-10-02 NOTE — Telephone Encounter (Signed)
Please advise 

## 2019-10-27 DIAGNOSIS — Z20822 Contact with and (suspected) exposure to covid-19: Secondary | ICD-10-CM | POA: Diagnosis not present

## 2019-10-27 DIAGNOSIS — Z03818 Encounter for observation for suspected exposure to other biological agents ruled out: Secondary | ICD-10-CM | POA: Diagnosis not present

## 2019-10-29 ENCOUNTER — Encounter: Payer: Self-pay | Admitting: Obstetrics and Gynecology

## 2019-10-29 ENCOUNTER — Other Ambulatory Visit: Payer: Self-pay

## 2019-10-29 ENCOUNTER — Ambulatory Visit (INDEPENDENT_AMBULATORY_CARE_PROVIDER_SITE_OTHER): Payer: BC Managed Care – PPO | Admitting: Obstetrics and Gynecology

## 2019-10-29 VITALS — Ht 64.0 in | Wt 187.0 lb

## 2019-10-29 DIAGNOSIS — E669 Obesity, unspecified: Secondary | ICD-10-CM

## 2019-10-29 DIAGNOSIS — Z6832 Body mass index (BMI) 32.0-32.9, adult: Secondary | ICD-10-CM

## 2019-10-29 MED ORDER — PHENTERMINE HCL 37.5 MG PO TABS
37.5000 mg | ORAL_TABLET | Freq: Every day | ORAL | 0 refills | Status: DC
Start: 2019-10-29 — End: 2019-12-10

## 2019-10-29 NOTE — Progress Notes (Signed)
I connected with Kellie Davis on 10/29/19 at  8:50 AM EDT by telephone and verified that I am speaking with the correct person using two identifiers.   I discussed the limitations, risks, security and privacy concerns of performing an evaluation and management service by telephone and the availability of in person appointments. I also discussed with the patient that there may be a patient responsible charge related to this service. The patient expressed understanding and agreed to proceed.  The patient was at home I spoke with the patient from my workstation phone The names of people involved in this encounter were: Kellie Davis , and Kellie Davis   Gynecology Office Visit  Chief Complaint:  Chief Complaint  Patient presents with  . Weight Check    History of Present Illness: Patientis a 34 y.o. [id number] female, who presents for the evaluation of the desire to lose weight. She has lost 5 pounds 1 months. The patient states the following symptoms since starting her weight loss therapy: appetite suppression, energy, and weight loss.  The patient also reports no other ill effects. The patient specifically denies heart palpitations, anxiety, and insomnia.    Review of Systems: 10 point review of systems negative unless otherwise noted in HPI  Past Medical History:  Past Medical History:  Diagnosis Date  . Anxiety disorder, unspecified    Situational  . Hypothyroidism     Past Surgical History:  Past Surgical History:  Procedure Laterality Date  . AUGMENTATION MAMMAPLASTY Bilateral 2013   known rupture left, to be replaced 04/25/2019  . BREAST ENHANCEMENT SURGERY  2014  . CESAREAN SECTION N/A 06/01/2016   Procedure: PRIMARY LOW TRANSVERSE CESAREAN SECTION;  Surgeon: Kellie Mood, MD;  Location: [facility];  Service: Obstetrics;  Laterality: N/A;  . CESAREAN SECTION N/A 01/16/2018   Procedure: CESAREAN SECTION;  Surgeon: Kellie Mood, MD;  Location: [facility];   Service: Obstetrics;  Laterality: N/A;  . COLPOSCOPY  02/25/2012  . LAPAROSCOPIC SALPINGO OOPHERECTOMY Right 01/06/2015   Procedure: LAPAROSCOPIC right SALPINGECTOMYwith removal of ectopic and partial  right corneal resection ;  no oophorectomySurgeon: Kellie Mood, MD;  Location: [facility];  Service: Gynecology;  Laterality: Right;  . TONSILLECTOMY  2014    Gynecologic History: No LMP recorded.  Obstetric History: [id number]  Family History:  Family History  Problem Relation Age of Onset  . Heart disease Mother   . Diabetes Mother   . Hyperlipidemia Mother   . Breast cancer Mother 62    Social History:  Social History   Socioeconomic History  . Marital status: Married    Spouse name: Not on file  . Number of children: Not on file  . Years of education: Not on file  . Highest education level: Not on file  Occupational History  . Not on file  Tobacco Use  . Smoking status: Never Smoker  . Smokeless tobacco: Never Used  Vaping Use  . Vaping Use: Never used  Substance and Sexual Activity  . Alcohol use: No    Comment: occas  . Drug use: No  . Sexual activity: Yes    Birth control/protection: Pill  Other Topics Concern  . Not on file  Social History Narrative  . Not on file   Social Determinants of Health   Financial Resource Strain:   . Difficulty of Paying Living Expenses: Not on file  Food Insecurity:   . Worried About Charity fundraiser in the Last Year: Not on file  .  Ran Out of Food in the Last Year: Not on file  Transportation Needs:   . Lack of Transportation (Medical): Not on file  . Lack of Transportation (Non-Medical): Not on file  Physical Activity:   . Days of Exercise per Week: Not on file  . Minutes of Exercise per Session: Not on file  Stress:   . Feeling of Stress : Not on file  Social Connections:   . Frequency of Communication with Friends and Family: Not on file  . Frequency of Social Gatherings with Friends and Family: Not on file  .  Attends Religious Services: Not on file  . Active Member of Clubs or Organizations: Not on file  . Attends Archivist Meetings: Not on file  . Marital Status: Not on file  Intimate Partner Violence:   . Fear of Current or Ex-Partner: Not on file  . Emotionally Abused: Not on file  . Physically Abused: Not on file  . Sexually Abused: Not on file    Allergies:  Allergies  Allergen Reactions  . Codeine Anaphylaxis  . Liraglutide (Weight Management) Nausea And Vomiting    Presumed pancreatitis nausea vomiting abdominal pain    Medications: Prior to Admission medications   Medication Sig Start Date End Date Taking? Authorizing Provider  buPROPion (WELLBUTRIN XL) 300 MG 24 hr tablet TAKE 1 TABLET(300 MG) BY MOUTH DAILY 10/04/19   Kellie Mood, MD  Diethylpropion HCl CR 75 MG TB24 Take 1 tablet (75 mg total) by mouth daily before breakfast. 10/01/19   Kellie Mood, MD  drospirenone-ethinyl estradiol (YASMIN) 3-0.03 MG tablet TAKE 1 TABLET BY MOUTH DAILY 07/24/19   Kellie Mood, MD  Magnesium 250 MG TABS Take 250 mg by mouth 2 (two) times daily.     [provider]  phentermine (ADIPEX-P) 37.5 MG tablet Take 1 tablet (37.5 mg total) by mouth daily before breakfast. Patient not taking: Reported on 10/01/2019 06/27/19   Kellie Mood, MD  triamcinolone cream (KENALOG) 0.1 % Apply 1 application topically 2 (two) times daily. 10/01/19   Kellie Mood, MD    Physical Exam Height 5\' 4"  (1.626 m), weight 187 lb (84.8 kg), not currently breastfeeding. Wt Readings from Last 3 Encounters:  10/29/19 187 lb (84.8 kg)  10/01/19 192 lb (87.1 kg)  05/25/19 186 lb (84.4 kg)  Body mass index is 32.1 kg/m.  No physical exam as this was a remote telephone visit to promote social distancing during the current COVID-19 Pandemic  Assessment: 34 y.o. [id number] follow up medical weight loss   Plan: Problem List Items Addressed This Visit    None    Visit Diagnoses     Class 1 obesity without serious comorbidity with body mass index (BMI) of 32.0 to 32.9 in adult, unspecified obesity type    -  Primary      1) 1500 Calorie ADA Diet  2) Patient education given regarding appropriate lifestyle changes for weight loss including: regular physical activity, healthy coping strategies, caloric restriction and healthy eating patterns.  3) Patient will be started on weight loss medication. The risks and benefits and side effects of medication, such as Adipex (Phenteramine) ,  Tenuate (Diethylproprion), Belviq (lorcarsin), Contrave (buproprion/naltrexone), Qsymia (phentermine/topiramate), and Saxenda (liraglutide) is discussed. The pros and cons of suppressing appetite and boosting metabolism is discussed. Risks of tolerence and addiction is discussed for selected agents discussed. Use of medicine will ne short term, such as 3-4 months at a time followed by a period of time off of  the medicine to avoid these risks and side effects for Adipex, Qsymia, and Tenuate discussed. Pt to call with any negative side effects and agrees to keep follow up appts.  4) Patient to take medication, with the benefits of appetite suppression and metabolism boost d/w pt, along with the side effects and risk factors of long term use that will be avoided with our use of short bursts of therapy. Rx provided.    5) Telephone Time 8:70min  6)  Return in about 4 weeks (around 11/26/2019) for medication follow up .    Kellie Mood, MD, [facility], [facility] 10/29/2019, 9:26 AM

## 2019-11-06 ENCOUNTER — Other Ambulatory Visit: Payer: Self-pay | Admitting: Obstetrics and Gynecology

## 2019-11-06 DIAGNOSIS — N6310 Unspecified lump in the right breast, unspecified quadrant: Secondary | ICD-10-CM

## 2019-11-08 ENCOUNTER — Other Ambulatory Visit: Payer: Self-pay | Admitting: Obstetrics and Gynecology

## 2019-11-08 DIAGNOSIS — R928 Other abnormal and inconclusive findings on diagnostic imaging of breast: Secondary | ICD-10-CM

## 2019-11-13 ENCOUNTER — Other Ambulatory Visit: Payer: Self-pay

## 2019-11-13 ENCOUNTER — Ambulatory Visit
Admission: RE | Admit: 2019-11-13 | Discharge: 2019-11-13 | Disposition: A | Discharge status: Home / Self Care | Payer: BC Managed Care – PPO | Source: Ambulatory Visit | Attending: Obstetrics and Gynecology | Admitting: Obstetrics and Gynecology

## 2019-11-13 ENCOUNTER — Other Ambulatory Visit: Payer: Self-pay | Admitting: Obstetrics and Gynecology

## 2019-11-13 DIAGNOSIS — N6313 Unspecified lump in the right breast, lower outer quadrant: Secondary | ICD-10-CM | POA: Diagnosis not present

## 2019-11-13 DIAGNOSIS — R928 Other abnormal and inconclusive findings on diagnostic imaging of breast: Secondary | ICD-10-CM | POA: Diagnosis not present

## 2019-11-13 DIAGNOSIS — N6314 Unspecified lump in the right breast, lower inner quadrant: Secondary | ICD-10-CM | POA: Diagnosis not present

## 2019-11-16 ENCOUNTER — Other Ambulatory Visit: Payer: Self-pay | Admitting: Obstetrics and Gynecology

## 2019-11-23 ENCOUNTER — Other Ambulatory Visit: Payer: Self-pay | Admitting: Obstetrics and Gynecology

## 2019-11-23 MED ORDER — DROSPIRENONE-ETHINYL ESTRADIOL 3-0.03 MG PO TABS
1.0000 | ORAL_TABLET | Freq: Every day | ORAL | 3 refills | Status: DC
Start: 2019-11-23 — End: 2020-07-14

## 2019-11-27 ENCOUNTER — Ambulatory Visit: Payer: BC Managed Care – PPO | Admitting: Obstetrics and Gynecology

## 2019-12-10 ENCOUNTER — Other Ambulatory Visit: Payer: Self-pay

## 2019-12-10 ENCOUNTER — Ambulatory Visit (INDEPENDENT_AMBULATORY_CARE_PROVIDER_SITE_OTHER): Payer: BC Managed Care – PPO | Admitting: Obstetrics and Gynecology

## 2019-12-10 VITALS — Wt 182.0 lb

## 2019-12-10 DIAGNOSIS — Z6831 Body mass index (BMI) 31.0-31.9, adult: Secondary | ICD-10-CM

## 2019-12-10 DIAGNOSIS — E669 Obesity, unspecified: Secondary | ICD-10-CM | POA: Diagnosis not present

## 2019-12-10 DIAGNOSIS — B3731 Acute candidiasis of vulva and vagina: Secondary | ICD-10-CM

## 2019-12-10 DIAGNOSIS — B373 Candidiasis of vulva and vagina: Secondary | ICD-10-CM | POA: Diagnosis not present

## 2019-12-10 MED ORDER — PHENTERMINE HCL 37.5 MG PO TABS
37.5000 mg | ORAL_TABLET | Freq: Every day | ORAL | 0 refills | Status: DC
Start: 2019-12-10 — End: 2020-04-15

## 2019-12-10 MED ORDER — FLUCONAZOLE 150 MG PO TABS: 150.0000 mg | ORAL_TABLET | Freq: Once | ORAL | 0 refills | Status: AC

## 2019-12-10 NOTE — Progress Notes (Signed)
I connected with Kellie Davis  on 12/10/19 at  1:30 PM EST by telephone and verified that I am speaking with the correct person using two identifiers.   I discussed the limitations, risks, security and privacy concerns of performing an evaluation and management service by telephone and the availability of in person appointments. I also discussed with the patient that there may be a patient responsible charge related to this service. The patient expressed understanding and agreed to proceed.  The patient was at home I spoke with the patient from my workstation phone The names of people involved in this encounter were: Kellie Davis , and [name]   Gynecology Office Visit  Chief Complaint: No chief complaint on file.   History of Present Illness: Patientis a 34 y.o. [id number] female, who presents for the evaluation of the desire to lose weight. She has lost 5 pounds 1 months. The patient states the following symptoms since starting her weight loss therapy: appetite suppression, energy, and weight loss.  The patient also reports no other ill effects. The patient specifically denies heart palpitations, anxiety, and insomnia.    Review of Systems: 10 point review of systems negative unless otherwise noted in HPI  Past Medical History:  Past Medical History:  Diagnosis Date  . Anxiety disorder, unspecified    Situational  . Hypothyroidism     Past Surgical History:  Past Surgical History:  Procedure Laterality Date  . AUGMENTATION MAMMAPLASTY Bilateral 2013   known rupture left, to be replaced 04/25/2019  . BREAST ENHANCEMENT SURGERY  2014  . CESAREAN SECTION N/A 06/01/2016   Procedure: PRIMARY LOW TRANSVERSE CESAREAN SECTION;  Surgeon: [name], MD;  Location: [facility];  Service: Obstetrics;  Laterality: N/A;  . CESAREAN SECTION N/A 01/16/2018   Procedure: CESAREAN SECTION;  Surgeon: [name], MD;  Location: [facility];  Service: Obstetrics;   Laterality: N/A;  . COLPOSCOPY  02/25/2012  . LAPAROSCOPIC SALPINGO OOPHERECTOMY Right 01/06/2015   Procedure: LAPAROSCOPIC right SALPINGECTOMYwith removal of ectopic and partial  right corneal resection ;  no oophorectomySurgeon: [name], MD;  Location: [facility];  Service: Gynecology;  Laterality: Right;  . TONSILLECTOMY  2014    Gynecologic History: Patient's last menstrual period was 11/13/2019 (exact date).  Obstetric History: [id number]  Family History:  Family History  Problem Relation Age of Onset  . Heart disease Mother   . Diabetes Mother   . Hyperlipidemia Mother   . Breast cancer Mother 58    Social History:  Social History   Socioeconomic History  . Marital status: Married    Spouse name: Not on file  . Number of children: Not on file  . Years of education: Not on file  . Highest education level: Not on file  Occupational History  . Not on file  Tobacco Use  . Smoking status: Never Smoker  . Smokeless tobacco: Never Used  Vaping Use  . Vaping Use: Never used  Substance and Sexual Activity  . Alcohol use: No    Comment: occas  . Drug use: No  . Sexual activity: Yes    Birth control/protection: Pill  Other Topics Concern  . Not on file  Social History Narrative  . Not on file   Social Determinants of Health   Financial Resource Strain:   . Difficulty of Paying Living Expenses: Not on file  Food Insecurity:   . Worried About Charity fundraiser in the Last Year: Not on file  .  Ran Out of Food in the Last Year: Not on file  Transportation Needs:   . Lack of Transportation (Medical): Not on file  . Lack of Transportation (Non-Medical): Not on file  Physical Activity:   . Days of Exercise per Week: Not on file  . Minutes of Exercise per Session: Not on file  Stress:   . Feeling of Stress : Not on file  Social Connections:   . Frequency of Communication with Friends and Family: Not on file  . Frequency of Social Gatherings with Friends and  Family: Not on file  . Attends Religious Services: Not on file  . Active Member of Clubs or Organizations: Not on file  . Attends Archivist Meetings: Not on file  . Marital Status: Not on file  Intimate Partner Violence:   . Fear of Current or Ex-Partner: Not on file  . Emotionally Abused: Not on file  . Physically Abused: Not on file  . Sexually Abused: Not on file    Allergies:  Allergies  Allergen Reactions  . Codeine Anaphylaxis  . Liraglutide (Weight Management) Nausea And Vomiting    Presumed pancreatitis nausea vomiting abdominal pain    Medications: Prior to Admission medications   Medication Sig Start Date End Date Taking? Authorizing Provider  buPROPion (WELLBUTRIN XL) 300 MG 24 hr tablet TAKE 1 TABLET(300 MG) BY MOUTH DAILY 10/04/19   [name], MD  Diethylpropion HCl CR 75 MG TB24 Take 1 tablet (75 mg total) by mouth daily before breakfast. 10/01/19   [name], MD  drospirenone-ethinyl estradiol (YASMIN) 3-0.03 MG tablet Take 1 tablet by mouth daily. 11/23/19   [name], MD  Magnesium 250 MG TABS Take 250 mg by mouth 2 (two) times daily.     [provider]  phentermine (ADIPEX-P) 37.5 MG tablet Take 1 tablet (37.5 mg total) by mouth daily before breakfast. 10/29/19   [name], MD  triamcinolone cream (KENALOG) 0.1 % Apply 1 application topically 2 (two) times daily. 10/01/19   [name], MD    Physical Exam Last menstrual period 11/13/2019, not currently breastfeeding. Wt Readings from Last 3 Encounters:  12/10/19 182 lb (82.6 kg)  10/29/19 187 lb (84.8 kg)  10/01/19 192 lb (87.1 kg)  Body mass index is 31.24 kg/m.  No physical exam as this was a remote telephone visit to promote social distancing during the current COVID-19 Pandemic  Assessment: 34 y.o. [id number] medical weight loss follow up  Plan: Problem List Items Addressed This Visit    None    Visit Diagnoses    Class 1 obesity without serious  comorbidity with body mass index (BMI) of 31.0 to 31.9 in adult, unspecified obesity type    -  Primary   Relevant Medications   phentermine (ADIPEX-P) 37.5 MG tablet   Vaginal candida       Relevant Medications   fluconazole (DIFLUCAN) 150 MG tablet      1) 1500 Calorie ADA Diet  2) Patient education given regarding appropriate lifestyle changes for weight loss including: regular physical activity, healthy coping strategies, caloric restriction and healthy eating patterns.  3) Patient will be started on weight loss medication. The risks and benefits and side effects of medication, such as Adipex (Phenteramine) ,  Tenuate (Diethylproprion), Belviq (lorcarsin), Contrave (buproprion/naltrexone), Qsymia (phentermine/topiramate), and Saxenda (liraglutide) is discussed. The pros and cons of suppressing appetite and boosting metabolism is discussed. Risks of tolerence and addiction is discussed for selected agents discussed. Use of medicine will ne  short term, such as 3-4 months at a time followed by a period of time off of the medicine to avoid these risks and side effects for Adipex, Qsymia, and Tenuate discussed. Pt to call with any negative side effects and agrees to keep follow up appts. - continue phentermine  4) Patient to take medication, with the benefits of appetite suppression and metabolism boost d/w pt, along with the side effects and risk factors of long term use that will be avoided with our use of short bursts of therapy. Rx provided.    5) Vaginal candida symptoms - empiric treatment diflucan 150mg    6) Telephone 8:13 min  7)  Return in about 4 weeks (around 01/07/2020) for medication follow up phone.    [name], MD, [facility], [facility] 12/10/2019, 1:41 PM

## 2020-01-28 DIAGNOSIS — U071 COVID-19: Secondary | ICD-10-CM | POA: Diagnosis not present

## 2020-01-28 DIAGNOSIS — Z03818 Encounter for observation for suspected exposure to other biological agents ruled out: Secondary | ICD-10-CM | POA: Diagnosis not present

## 2020-01-28 DIAGNOSIS — Z20822 Contact with and (suspected) exposure to covid-19: Secondary | ICD-10-CM | POA: Diagnosis not present

## 2020-04-15 ENCOUNTER — Encounter: Payer: Self-pay | Admitting: Obstetrics and Gynecology

## 2020-04-15 ENCOUNTER — Ambulatory Visit (INDEPENDENT_AMBULATORY_CARE_PROVIDER_SITE_OTHER): Payer: BC Managed Care – PPO | Admitting: Obstetrics and Gynecology

## 2020-04-15 ENCOUNTER — Other Ambulatory Visit: Payer: Self-pay

## 2020-04-15 VITALS — Ht 64.0 in | Wt 195.5 lb

## 2020-04-15 DIAGNOSIS — Z6833 Body mass index (BMI) 33.0-33.9, adult: Secondary | ICD-10-CM

## 2020-04-15 DIAGNOSIS — E669 Obesity, unspecified: Secondary | ICD-10-CM

## 2020-04-15 MED ORDER — PHENTERMINE HCL 37.5 MG PO TABS
37.5000 mg | ORAL_TABLET | Freq: Every day | ORAL | 0 refills | Status: DC
Start: 2020-04-15 — End: 2020-07-14

## 2020-04-15 NOTE — Telephone Encounter (Signed)
Spoke w/patient. Apt scheduled for 3:30 today phone visit.

## 2020-04-15 NOTE — Telephone Encounter (Signed)
Needs appointment phone visit or in person

## 2020-04-15 NOTE — Progress Notes (Signed)
I connected with Kellie Davis on 04/15/20 at  3:30 PM EDT by telephone and verified that I am speaking with the correct person using two identifiers.   I discussed the limitations, risks, security and privacy concerns of performing an evaluation and management service by telephone and the availability of in person appointments. I also discussed with the patient that there may be a patient responsible charge related to this service. The patient expressed understanding and agreed to proceed.  The patient was at home I spoke with the patient from my workstation phone. The names of people involved in this encounter were: Kellie Davis , and [name]   Gynecology Office Visit  Chief Complaint:  Chief Complaint  Patient presents with  . Follow-up    Phone visit - weight loss    History of Present Illness: Patientis a 35 y.o. [id number] female, who presents for the evaluation of weight gain. She has gained 13 pounds primarily over 4 months. The patient states the following issues have contributed to her weight problem: lifestyle, holidays.  The patient has no additional symptoms. The patient specifically denies memory loss, muscle weakness, excessive thirst, and polyuria. Weight related co-morbidities include none. The patient's past medical history is non-contributory. She has tried phentermine interventions in the past with good success.   Review of Systems: 10 point review of systems negative unless otherwise noted in HPI  Past Medical History:  Patient Active Problem List   Diagnosis Date Noted  . Uterine scar from previous cesarean delivery affecting pregnancy 01/16/2018  . Avitaminosis D 04/29/2016  . Hypercholesterolemia without hypertriglyceridemia 04/29/2016  . History of hypothyroidism 03/30/2016    Past Surgical History:  Past Surgical History:  Procedure Laterality Date  . AUGMENTATION MAMMAPLASTY Bilateral 2013   known rupture left, to be replaced 04/25/2019  .  BREAST ENHANCEMENT SURGERY  2014  . CESAREAN SECTION N/A 06/01/2016   Procedure: PRIMARY LOW TRANSVERSE CESAREAN SECTION;  Surgeon: [name], MD;  Location: [facility];  Service: Obstetrics;  Laterality: N/A;  . CESAREAN SECTION N/A 01/16/2018   Procedure: CESAREAN SECTION;  Surgeon: [name], MD;  Location: [facility];  Service: Obstetrics;  Laterality: N/A;  . COLPOSCOPY  02/25/2012  . LAPAROSCOPIC SALPINGO OOPHERECTOMY Right 01/06/2015   Procedure: LAPAROSCOPIC right SALPINGECTOMYwith removal of ectopic and partial  right corneal resection ;  no oophorectomySurgeon: [name], MD;  Location: [facility];  Service: Gynecology;  Laterality: Right;  . TONSILLECTOMY  2014    Gynecologic History: Patient's last menstrual period was 03/26/2020.  Obstetric History: [id number]  Family History:  Family History  Problem Relation Age of Onset  . Heart disease Mother   . Diabetes Mother   . Hyperlipidemia Mother   . Breast cancer Mother 58    Social History:  Social History   Socioeconomic History  . Marital status: Married    Spouse name: Not on file  . Number of children: Not on file  . Years of education: Not on file  . Highest education level: Not on file  Occupational History  . Not on file  Tobacco Use  . Smoking status: Never Smoker  . Smokeless tobacco: Never Used  Vaping Use  . Vaping Use: Never used  Substance and Sexual Activity  . Alcohol use: No    Comment: occas  . Drug use: No  . Sexual activity: Yes    Birth control/protection: Pill  Other Topics Concern  . Not on file  Social History Narrative  . Not  on file   Social Determinants of Health   Financial Resource Strain: Not on file  Food Insecurity: Not on file  Transportation Needs: Not on file  Physical Activity: Not on file  Stress: Not on file  Social Connections: Not on file  Intimate Partner Violence: Not on file    Allergies:  Allergies  Allergen Reactions  . Codeine Anaphylaxis   . Liraglutide (Weight Management) Nausea And Vomiting    Presumed pancreatitis nausea vomiting abdominal pain    Medications: Prior to Admission medications   Medication Sig Start Date End Date Taking? Authorizing Provider  buPROPion (WELLBUTRIN XL) 300 MG 24 hr tablet TAKE 1 TABLET(300 MG) BY MOUTH DAILY 10/04/19  Yes [name], MD  drospirenone-ethinyl estradiol (YASMIN) 3-0.03 MG tablet Take 1 tablet by mouth daily. 11/23/19  Yes [name], MD  Diethylpropion HCl CR 75 MG TB24 Take 1 tablet (75 mg total) by mouth daily before breakfast. Patient not taking: Reported on 04/15/2020 10/01/19   [name], MD  Magnesium 250 MG TABS Take 250 mg by mouth 2 (two) times daily.  Patient not taking: Reported on 04/15/2020    [provider]  phentermine (ADIPEX-P) 37.5 MG tablet Take 1 tablet (37.5 mg total) by mouth daily before breakfast. 12/10/19   [name], MD  triamcinolone cream (KENALOG) 0.1 % Apply 1 application topically 2 (two) times daily. Patient not taking: Reported on 04/15/2020 10/01/19   [name], MD    Physical Exam Height 5\' 4"  (1.626 m), weight 195 lb 8 oz (88.7 kg), last menstrual period 03/26/2020. Patient's last menstrual period was 03/26/2020. Body mass index is 33.56 kg/m.  No physical exam as this was a remote telephone visit to promote social distancing during the current COVID-19 Pandemic  Assessment: 35 y.o. [id number] presenting for discussion of weight loss management options  Plan: Problem List Items Addressed This Visit   None   Visit Diagnoses    Class 1 obesity without serious comorbidity with body mass index (BMI) of 33.0 to 33.9 in adult, unspecified obesity type    -  Primary   Relevant Medications   phentermine (ADIPEX-P) 37.5 MG tablet      1) 1500 Calorie ADA Diet  2) Patient education given regarding appropriate lifestyle changes for weight loss including: regular physical activity, healthy coping  strategies, caloric restriction and healthy eating patterns.  3) Patient will be started on weight loss medication. The risks and benefits and side effects of medication, such as Adipex (Phenteramine) ,  Tenuate (Diethylproprion), Belviq (lorcarsin), Contrave (buproprion/naltrexone), Qsymia (phentermine/topiramate), and Saxenda (liraglutide) is discussed. The pros and cons of suppressing appetite and boosting metabolism is discussed. Risks of tolerence and addiction is discussed for selected agents discussed. Use of medicine will ne short term, such as 3-4 months at a time followed by a period of time off of the medicine to avoid these risks and side effects for Adipex, Qsymia, and Tenuate discussed. Pt to call with any negative side effects and agrees to keep follow up appts.  4) Comorbidity Screening - hypothyroidism screening, diabetes, and hyperlipidemia screening offered  5) Encouraged weekly weight monitorig to track progress and sample 1 week food diary  6) Telephone Time 9:36min  7) Return in about 4 weeks (around 05/13/2020) for medication follow up (phone, video, or in person).   [name], MD, [facility], [facility] 04/15/2020, 3:58 PM

## 2020-05-15 ENCOUNTER — Other Ambulatory Visit: Payer: Self-pay

## 2020-05-15 ENCOUNTER — Ambulatory Visit
Admission: RE | Admit: 2020-05-15 | Discharge: 2020-05-15 | Disposition: A | Discharge status: Home / Self Care | Payer: BC Managed Care – PPO | Source: Ambulatory Visit | Attending: Obstetrics and Gynecology | Admitting: Obstetrics and Gynecology

## 2020-05-15 DIAGNOSIS — R928 Other abnormal and inconclusive findings on diagnostic imaging of breast: Secondary | ICD-10-CM | POA: Insufficient documentation

## 2020-05-15 DIAGNOSIS — N6310 Unspecified lump in the right breast, unspecified quadrant: Secondary | ICD-10-CM | POA: Diagnosis not present

## 2020-05-15 DIAGNOSIS — N6314 Unspecified lump in the right breast, lower inner quadrant: Secondary | ICD-10-CM | POA: Diagnosis not present

## 2020-05-15 DIAGNOSIS — N6313 Unspecified lump in the right breast, lower outer quadrant: Secondary | ICD-10-CM | POA: Diagnosis not present

## 2020-07-12 ENCOUNTER — Other Ambulatory Visit: Payer: Self-pay | Admitting: Obstetrics and Gynecology

## 2020-07-14 ENCOUNTER — Ambulatory Visit (INDEPENDENT_AMBULATORY_CARE_PROVIDER_SITE_OTHER): Payer: BC Managed Care – PPO | Admitting: Obstetrics and Gynecology

## 2020-07-14 ENCOUNTER — Other Ambulatory Visit: Payer: Self-pay

## 2020-07-14 ENCOUNTER — Encounter: Payer: Self-pay | Admitting: Obstetrics and Gynecology

## 2020-07-14 VITALS — Ht 64.0 in | Wt 201.0 lb

## 2020-07-14 DIAGNOSIS — E669 Obesity, unspecified: Secondary | ICD-10-CM

## 2020-07-14 DIAGNOSIS — Z6834 Body mass index (BMI) 34.0-34.9, adult: Secondary | ICD-10-CM

## 2020-07-14 MED ORDER — PHENTERMINE HCL 37.5 MG PO TABS: 37.5000 mg | ORAL_TABLET | Freq: Every day | ORAL | 0 refills | Status: DC

## 2020-07-14 NOTE — Progress Notes (Signed)
I connected with Kellie Davis on 07/14/20 at 11:30 AM EDT by telephone and verified that I am speaking with the correct person using two identifiers.   I discussed the limitations, risks, security and privacy concerns of performing an evaluation and management service by telephone and the availability of in person appointments. I also discussed with the patient that there may be a patient responsible charge related to this service. The patient expressed understanding and agreed to proceed.  The patient was at home I spoke with the patient from my workstation phone The names of people involved in this encounter were: Kellie Davis , and [name]   Gynecology Office Visit  Chief Complaint:  Chief Complaint  Patient presents with   Follow-up    Phone visit - Weight loss - no concerns    History of Present Illness:  Patientis a 35 y.o. [id number] female, who presents for the evaluation of weight gain. She has gained 6 pounds primarily over 3 months. The patient states the following issues have contributed to her weight problem: lifestyle.  The patient has no additional symptoms. The patient specifically denies memory loss, muscle weakness, excessive thirst, and polyuria. Weight related co-morbidities include none. The patient's past medical history is notable for none. She has tried phentermine interventions in the past with good success.   Review of Systems: 10 point review of systems negative unless otherwise noted in HPI  Past Medical History:  Past Medical History:  Diagnosis Date   Anxiety disorder, unspecified    Situational   Hypothyroidism     Past Surgical History:  Past Surgical History:  Procedure Laterality Date   AUGMENTATION MAMMAPLASTY Bilateral 2013   known rupture left, to be replaced 04/25/2019   BREAST ENHANCEMENT SURGERY  2014   CESAREAN SECTION N/A 06/01/2016   Procedure: PRIMARY LOW TRANSVERSE CESAREAN SECTION;  Surgeon: [name], MD;   Location: [facility];  Service: Obstetrics;  Laterality: N/A;   CESAREAN SECTION N/A 01/16/2018   Procedure: CESAREAN SECTION;  Surgeon: [name], MD;  Location: [facility];  Service: Obstetrics;  Laterality: N/A;   COLPOSCOPY  02/25/2012   LAPAROSCOPIC SALPINGO OOPHERECTOMY Right 01/06/2015   Procedure: LAPAROSCOPIC right SALPINGECTOMYwith removal of ectopic and partial  right corneal resection ;  no oophorectomySurgeon: [name], MD;  Location: [facility];  Service: Gynecology;  Laterality: Right;   TONSILLECTOMY  2014    Gynecologic History: No LMP recorded.  Obstetric History: [id number]  Family History:  Family History  Problem Relation Age of Onset   Heart disease Mother    Diabetes Mother    Hyperlipidemia Mother    Breast cancer Mother 36    Social History:  Social History   Socioeconomic History   Marital status: Married    Spouse name: Not on file   Number of children: Not on file   Years of education: Not on file   Highest education level: Not on file  Occupational History   Not on file  Tobacco Use   Smoking status: Never   Smokeless tobacco: Never  Vaping Use   Vaping Use: Never used  Substance and Sexual Activity   Alcohol use: No    Comment: occas   Drug use: No   Sexual activity: Yes    Birth control/protection: Pill  Other Topics Concern   Not on file  Social History Narrative   Not on file   Social Determinants of Health   Financial Resource Strain: Not on file  Food Insecurity: Not on file  Transportation Needs: Not on file  Physical Activity: Not on file  Stress: Not on file  Social Connections: Not on file  Intimate Partner Violence: Not on file    Allergies:  Allergies  Allergen Reactions   Codeine Anaphylaxis   Liraglutide (Weight Management) Nausea And Vomiting    Presumed pancreatitis nausea vomiting abdominal pain    Medications: Prior to Admission medications   Medication Sig Start Date End Date Taking?  Authorizing Provider  buPROPion (WELLBUTRIN XL) 300 MG 24 hr tablet TAKE 1 TABLET(300 MG) BY MOUTH DAILY 10/04/19  Yes [name], MD  phentermine (ADIPEX-P) 37.5 MG tablet Take 1 tablet (37.5 mg total) by mouth daily before breakfast. 04/15/20  Yes [name], MD  Magnesium 250 MG TABS Take 250 mg by mouth 2 (two) times daily.  Patient not taking: Reported on 04/15/2020    [provider]  SYEDA 3-0.03 MG tablet TAKE 1 TABLET BY MOUTH DAILY 07/14/20   [name], MD  triamcinolone cream (KENALOG) 0.1 % Apply 1 application topically 2 (two) times daily. Patient not taking: Reported on 04/15/2020 10/01/19   [name], MD    Physical Exam Height 5\' 4"  (1.626 m), weight 201 lb (91.2 kg). Wt Readings from Last 3 Encounters:  07/14/20 201 lb (91.2 kg)  04/15/20 195 lb 8 oz (88.7 kg)  12/10/19 182 lb (82.6 kg)  Body mass index is 34.5 kg/m.  No physical exam as this was a remote telephone visit to promote social distancing during the current COVID-19 Pandemic   Assessment: 35 y.o. [id number] medical weight loss visit   Plan: Problem List Items Addressed This Visit   None Visit Diagnoses     Class 1 obesity without serious comorbidity with body mass index (BMI) of 34.0 to 34.9 in adult, unspecified obesity type    -  Primary   Relevant Medications   phentermine (ADIPEX-P) 37.5 MG tablet       1) 1500 Calorie ADA Diet  2) Patient education given regarding appropriate lifestyle changes for weight loss including: regular physical activity, healthy coping strategies, caloric restriction and healthy eating patterns.  3) Discussed co-morbidity screening TSH/HgbA1C as well as Saxenda and [place] if poor response on phentermine restart  4) Patient to take medication, with the benefits of appetite suppression and metabolism boost d/w pt, along with the side effects and risk factors of long term use that will be avoided with our use of short bursts of therapy. Rx  provided.    5) Telephone time 5:17min  6) Return in about 4 weeks (around 08/11/2020) for annual, medication follow up.    [name], MD, [facility], [facility] 07/14/2020, 11:55 AM

## 2020-07-24 ENCOUNTER — Other Ambulatory Visit: Payer: Self-pay | Admitting: Obstetrics and Gynecology

## 2020-07-24 MED ORDER — NITROFURANTOIN MONOHYD MACRO 100 MG PO CAPS: 100.0000 mg | ORAL_CAPSULE | Freq: Two times a day (BID) | ORAL | 0 refills | Status: AC

## 2020-08-12 ENCOUNTER — Ambulatory Visit: Payer: BC Managed Care – PPO | Admitting: Obstetrics and Gynecology

## 2020-08-20 ENCOUNTER — Other Ambulatory Visit: Payer: Self-pay | Admitting: Obstetrics and Gynecology

## 2020-08-20 MED ORDER — NITROFURANTOIN MONOHYD MACRO 100 MG PO CAPS: 100.0000 mg | ORAL_CAPSULE | Freq: Two times a day (BID) | ORAL | 0 refills | Status: AC

## 2020-08-20 MED ORDER — PHENTERMINE HCL 37.5 MG PO TABS: 37.5000 mg | ORAL_TABLET | Freq: Every day | ORAL | 0 refills | Status: DC

## 2020-08-20 NOTE — Progress Notes (Signed)
Current weight 197lbs, 4lbs weight loss in the last month.  Refill phentermine  UTI sx rx macrobid

## 2020-08-21 NOTE — Telephone Encounter (Signed)
Patient is scheduled for 09/23/20 with AMS

## 2020-08-25 ENCOUNTER — Ambulatory Visit: Payer: BC Managed Care – PPO | Admitting: Obstetrics and Gynecology

## 2020-09-05 DIAGNOSIS — Z03818 Encounter for observation for suspected exposure to other biological agents ruled out: Secondary | ICD-10-CM | POA: Diagnosis not present

## 2020-09-05 DIAGNOSIS — Z20822 Contact with and (suspected) exposure to covid-19: Secondary | ICD-10-CM | POA: Diagnosis not present

## 2020-09-23 ENCOUNTER — Other Ambulatory Visit: Payer: Self-pay

## 2020-09-23 ENCOUNTER — Ambulatory Visit (INDEPENDENT_AMBULATORY_CARE_PROVIDER_SITE_OTHER): Payer: BC Managed Care – PPO | Admitting: Obstetrics and Gynecology

## 2020-09-23 ENCOUNTER — Encounter: Payer: Self-pay | Admitting: Obstetrics and Gynecology

## 2020-09-23 ENCOUNTER — Other Ambulatory Visit (HOSPITAL_COMMUNITY)
Admission: RE | Admit: 2020-09-23 | Discharge: 2020-09-23 | Disposition: A | Discharge status: Home / Self Care | Payer: BC Managed Care – PPO | Source: Ambulatory Visit | Attending: Obstetrics and Gynecology | Admitting: Obstetrics and Gynecology

## 2020-09-23 VITALS — BP 118/68 | HR 96 | Ht 63.0 in | Wt 202.0 lb

## 2020-09-23 DIAGNOSIS — Z3041 Encounter for surveillance of contraceptive pills: Secondary | ICD-10-CM

## 2020-09-23 DIAGNOSIS — Z6835 Body mass index (BMI) 35.0-35.9, adult: Secondary | ICD-10-CM | POA: Diagnosis not present

## 2020-09-23 DIAGNOSIS — Z13220 Encounter for screening for lipoid disorders: Secondary | ICD-10-CM

## 2020-09-23 DIAGNOSIS — E669 Obesity, unspecified: Secondary | ICD-10-CM

## 2020-09-23 DIAGNOSIS — Z124 Encounter for screening for malignant neoplasm of cervix: Secondary | ICD-10-CM | POA: Insufficient documentation

## 2020-09-23 DIAGNOSIS — Z131 Encounter for screening for diabetes mellitus: Secondary | ICD-10-CM | POA: Diagnosis not present

## 2020-09-23 DIAGNOSIS — Z1239 Encounter for other screening for malignant neoplasm of breast: Secondary | ICD-10-CM

## 2020-09-23 DIAGNOSIS — Z01419 Encounter for gynecological examination (general) (routine) without abnormal findings: Secondary | ICD-10-CM

## 2020-09-23 DIAGNOSIS — N939 Abnormal uterine and vaginal bleeding, unspecified: Secondary | ICD-10-CM | POA: Diagnosis not present

## 2020-09-23 MED ORDER — PHENTERMINE HCL 37.5 MG PO TABS: 37.5000 mg | ORAL_TABLET | Freq: Every day | ORAL | 0 refills | Status: DC

## 2020-09-23 MED ORDER — DROSPIRENONE-ETHINYL ESTRADIOL 3-0.03 MG PO TABS: 1.0000 | ORAL_TABLET | Freq: Every day | ORAL | 3 refills | Status: DC

## 2020-09-23 MED ORDER — BUPROPION HCL ER (XL) 300 MG PO TB24: ORAL_TABLET | ORAL | 3 refills | Status: DC

## 2020-09-23 NOTE — Progress Notes (Signed)
Gynecology Annual Exam   PCP: [name], MD  Chief Complaint: No chief complaint on file.   History of Present Illness: Patient is a 35 y.o. Kellie Davis presents for annual exam. The patient has no complaints today.   LMP: No LMP recorded. Average Interval: regular, 28 days Duration of flow: 5 days Heavy Menses: no Clots: no Intermenstrual Bleeding: no Postcoital Bleeding: no Dysmenorrhea: no  The patient is sexually active. She currently uses OCP (estrogen/progesterone) for contraception. She denies dyspareunia.  The patient does perform self breast exams.  There is notable family history of breast or ovarian cancer in her family.  The patient wears seatbelts: yes.   The patient has regular exercise: not asked.    The patient denies current symptoms of depression.    Patientis a 35 y.o. Kellie Davis female, who presents for the evaluation of the desire to lose weight. She has gained 5 pounds in 1 months. The patient states the following symptoms since starting her weight loss therapy: appetite suppression, energy, and weight loss.  The patient also reports no other ill effects. The patient specifically denies heart palpitations, anxiety, and insomnia.    Review of Systems: Review of Systems  Constitutional:  Negative for chills and fever.  HENT:  Negative for congestion.   Respiratory:  Negative for cough and shortness of breath.   Cardiovascular:  Negative for chest pain and palpitations.  Gastrointestinal:  Negative for abdominal pain, constipation, diarrhea, heartburn, nausea and vomiting.  Genitourinary:  Negative for dysuria, frequency and urgency.  Skin:  Negative for itching and rash.  Neurological:  Negative for dizziness and headaches.  Endo/Heme/Allergies:  Negative for polydipsia.  Psychiatric/Behavioral:  Negative for depression.    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 04/25/2019   BREAST ENHANCEMENT SURGERY  2014   CESAREAN SECTION N/A 06/01/2016   Procedure: PRIMARY LOW TRANSVERSE CESAREAN SECTION;  Surgeon: [name], MD;  Location: [facility] ORS;  Service: Obstetrics;  Laterality: N/A;   CESAREAN SECTION N/A 01/16/2018   Procedure: CESAREAN SECTION;  Surgeon: [name], MD;  Location: [facility] ORS;  Service: Obstetrics;  Laterality: N/A;   COLPOSCOPY  02/25/2012   LAPAROSCOPIC SALPINGO OOPHERECTOMY Right 01/06/2015   Procedure: LAPAROSCOPIC right SALPINGECTOMYwith removal of ectopic and partial  right corneal resection ;  no oophorectomySurgeon: [name], MD;  Location: [facility] ORS;  Service: Gynecology;  Laterality: Right;   TONSILLECTOMY  2014    Gynecologic History:  No LMP recorded. Contraception: OCP (estrogen/progesterone) Last Pap: Results were:04/20/2018 NIL and HR HPV negative  Mammogram Diagnostic BI-RAD III 04/20/2019 Breast US 01/13/20 and 05/16/2019 BI-RAD III recommend 1 year follow up to complete series  Obstetric History: Kellie Davis  Family History:  Family History  Problem Relation Age of Onset   Heart disease Mother    Diabetes Mother    Hyperlipidemia Mother    Breast cancer Mother 5    Social History:  Social History   Socioeconomic History   Marital status: Married    Spouse name: Not on file   Number of children: Not on file   Years of education: Not on file  Highest education level: Not on file  Occupational History   Not on file  Tobacco Use   Smoking status: Never   Smokeless tobacco: Never  Vaping Use   Vaping Use: Never used  Substance and Sexual Activity   Alcohol use: No     Comment: occas   Drug use: No   Sexual activity: Yes    Birth control/protection: Pill  Other Topics Concern   Not on file  Social History Narrative   Not on file   Social Determinants of Health   Financial Resource Strain: Not on file  Food Insecurity: Not on file  Transportation Needs: Not on file  Physical Activity: Not on file  Stress: Not on file  Social Connections: Not on file  Intimate Partner Violence: Not on file    Allergies:  Allergies  Allergen Reactions   Codeine Anaphylaxis   Liraglutide (Weight Management) Nausea And Vomiting    Presumed pancreatitis nausea vomiting abdominal pain    Medications: Prior to Admission medications   Medication Sig Start Date End Date Taking? Authorizing Provider  buPROPion (WELLBUTRIN XL) 300 MG 24 hr tablet TAKE 1 TABLET(300 MG) BY MOUTH DAILY 10/04/19   [name], MD  phentermine (ADIPEX-P) 37.5 MG tablet Take 1 tablet (37.5 mg total) by mouth daily before breakfast. 08/20/20   [name], MD  SYEDA 3-0.03 MG tablet TAKE 1 TABLET BY MOUTH DAILY 07/14/20   [name], MD    Physical Exam Vitals: Blood pressure 118/68, pulse 96, height '5\' 3"'$  (1.6 m), weight 202 lb (91.6 kg), last menstrual period 08/12/2020. Body mass index is 35.78 kg/m.  General: NAD HEENT: normocephalic, anicteric Thyroid: no enlargement, no palpable nodules Pulmonary: No increased work of breathing, CTAB Cardiovascular: RRR, distal pulses 2+ Breast: Breast symmetrical, no tenderness, no palpable nodules or masses, no skin or nipple retraction present, no nipple discharge.  No axillary or supraclavicular lymphadenopathy. Abdomen: NABS, soft, non-tender, non-distended.  Umbilicus without lesions.  No hepatomegaly, splenomegaly or masses palpable. No evidence of hernia  Genitourinary:  External: Normal external female genitalia.  Normal urethral meatus, normal Bartholin's and Skene's glands.    Vagina: Normal vaginal mucosa, no evidence  of prolapse.    Cervix: Grossly normal in appearance, no bleeding  Uterus: Non-enlarged, mobile, normal contour.  No CMT  Adnexa: ovaries non-enlarged, no adnexal masses  Rectal: deferred  Lymphatic: no evidence of inguinal lymphadenopathy Extremities: no edema, erythema, or tenderness Neurologic: Grossly intact Psychiatric: mood appropriate, affect full  Female chaperone present for pelvic and breast  portions of the physical exam    Assessment: 35 y.o. Kellie Davis routine annual exam  Plan: Problem List Items Addressed This Visit   None Visit Diagnoses     Encounter for gynecological examination without abnormal finding    -  Primary   Breast screening       Screening for malignant neoplasm of cervix       Relevant Orders   Cytology - PAP   Oral contraceptive pill surveillance       Class 2 obesity without serious comorbidity with body mass index (BMI) of 35.0 to 35.9 in adult, unspecified obesity type       Relevant Medications   phentermine (ADIPEX-P) 37.5 MG tablet   Other Relevant Orders   Lipid panel (Completed)   Comprehensive metabolic panel (Completed)   CBC (Completed)   Abnormal uterine bleeding       Relevant Orders   CBC (Completed)   Screening for diabetes mellitus  Relevant Orders   Comprehensive metabolic panel (Completed)   Lipid screening       Relevant Orders   Lipid panel (Completed)       1) STI screening  was notoffered and therefore not obtained  2)  ASCCP guidelines and rational discussed.  Patient opts for every 3 years screening interval  3) Contraception - the patient is currently using  OCP (estrogen/progesterone).  She is happy with her current form of contraception and plans to continue  4) Routine healthcare maintenance including cholesterol, diabetes screening discussed Ordered today  5) Obesity - weight up 5lbs, will continue phentermine this month.  If failure to achieve meaningful weight loss consider switching agents.  6)  BI-RAD III mammogram - serieal ultrasound have shown stability.  Radiology recommend follow up ultrasound in 1 year (April 2023)  7) Return in about 4 weeks (around 10/21/2020) for medication follow up.   [name], MD, [facility], [facility] 09/23/2020, 8:29 AM

## 2020-09-24 LAB — CBC
Hematocrit: 39.2 % (ref 34.0–46.6)
Hemoglobin: 13 g/dL (ref 11.1–15.9)
MCH: 31.4 pg (ref 26.6–33.0)
MCHC: 33.2 g/dL (ref 31.5–35.7)
MCV: 95 fL (ref 79–97)
Platelets: 194 10*3/uL (ref 150–450)
RBC: 4.14 x10E6/uL (ref 3.77–5.28)
RDW: 11.7 % (ref 11.7–15.4)
WBC: 6 10*3/uL (ref 3.4–10.8)

## 2020-09-24 LAB — LIPID PANEL
Chol/HDL Ratio: 3.1 ratio (ref 0.0–4.4)
Cholesterol, Total: 244 mg/dL — ABNORMAL HIGH (ref 100–199)
HDL: 78 mg/dL (ref >39)
LDL Chol Calc (NIH): 145 mg/dL — ABNORMAL HIGH (ref 0–99)
Triglycerides: 122 mg/dL (ref 0–149)
VLDL Cholesterol Cal: 21 mg/dL (ref 5–40)

## 2020-09-24 LAB — COMPREHENSIVE METABOLIC PANEL
ALT: 10 IU/L (ref 0–32)
AST: 14 IU/L (ref 0–40)
Albumin/Globulin Ratio: 1.8 (ref 1.2–2.2)
Albumin: 4.3 g/dL (ref 3.8–4.8)
Alkaline Phosphatase: 51 IU/L (ref 44–121)
BUN/Creatinine Ratio: 15 (ref 9–23)
BUN: 13 mg/dL (ref 6–20)
Bilirubin Total: 0.4 mg/dL (ref 0.0–1.2)
CO2: 22 mmol/L (ref 20–29)
Calcium: 9.1 mg/dL (ref 8.7–10.2)
Chloride: 104 mmol/L (ref 96–106)
Creatinine, Ser: 0.85 mg/dL (ref 0.57–1.00)
Globulin, Total: 2.4 g/dL (ref 1.5–4.5)
Glucose: 90 mg/dL (ref 65–99)
Potassium: 4.3 mmol/L (ref 3.5–5.2)
Sodium: 140 mmol/L (ref 134–144)
Total Protein: 6.7 g/dL (ref 6.0–8.5)
eGFR: 91 mL/min/{1.73_m2} (ref >59)

## 2020-09-29 LAB — CYTOLOGY - PAP
Comment: NEGATIVE
High risk HPV: NEGATIVE

## 2020-10-10 ENCOUNTER — Other Ambulatory Visit: Payer: Self-pay | Admitting: Obstetrics and Gynecology

## 2020-10-24 ENCOUNTER — Ambulatory Visit (INDEPENDENT_AMBULATORY_CARE_PROVIDER_SITE_OTHER): Payer: BC Managed Care – PPO | Admitting: Obstetrics and Gynecology

## 2020-10-24 ENCOUNTER — Other Ambulatory Visit (HOSPITAL_COMMUNITY)
Admission: RE | Admit: 2020-10-24 | Discharge: 2020-10-24 | Disposition: A | Discharge status: Home / Self Care | Payer: BC Managed Care – PPO | Source: Ambulatory Visit | Attending: Obstetrics and Gynecology | Admitting: Obstetrics and Gynecology

## 2020-10-24 ENCOUNTER — Ambulatory Visit: Payer: BC Managed Care – PPO | Admitting: Obstetrics and Gynecology

## 2020-10-24 ENCOUNTER — Other Ambulatory Visit: Payer: Self-pay

## 2020-10-24 ENCOUNTER — Encounter: Payer: Self-pay | Admitting: Obstetrics and Gynecology

## 2020-10-24 VITALS — BP 122/78 | Ht 63.5 in | Wt 203.0 lb

## 2020-10-24 DIAGNOSIS — R87619 Unspecified abnormal cytological findings in specimens from cervix uteri: Secondary | ICD-10-CM

## 2020-10-24 DIAGNOSIS — N870 Mild cervical dysplasia: Secondary | ICD-10-CM | POA: Diagnosis not present

## 2020-10-24 DIAGNOSIS — N840 Polyp of corpus uteri: Secondary | ICD-10-CM | POA: Diagnosis not present

## 2020-10-24 MED ORDER — PHENTERMINE HCL 37.5 MG PO TABS: 37.5000 mg | ORAL_TABLET | Freq: Every day | ORAL | 0 refills | Status: DC

## 2020-10-24 NOTE — H&P (View-Only) (Signed)
   GYNECOLOGY CLINIC COLPOSCOPY PROCEDURE NOTE  35 y.o. [id number] here for colposcopy for  atypical glandular cells NOS and HPV negative   pap smear on 09/23/2020. Discussed underlying role for HPV infection in the development of cervical dysplasia, its natural history and progression/regression, need for surveillance.  Is the patient  pregnant: No LMP: No LMP recorded. Smoking status:  reports that she has never smoked. She has never used smokeless tobacco. High risk partner:No History of STD:  No Future fertility desired:  No  Blood pressure 122/78, height 5' 3.5" (1.613 m), weight 203 lb (92.1 kg), last menstrual period 09/29/2020.   Patient given informed consent, signed copy in the chart, time out was performed.  The patient was position in dorsal lithotomy position. Speculum was placed the cervix was visualized.   After application of acetic acid colposcopic inspection of the cervix was undertaken.   Colposcopy adequate, full visualization of transformation zone: Yes no visible lesions; corresponding biopsies obtained.   ECC specimen obtained:  Yes   ENDOMETRIAL BIOPSY     The indications for endometrial biopsy were reviewed.   Risks of the biopsy including cramping, bleeding, infection, uterine perforation, inadequate specimen and need for additional procedures  were discussed. The patient states she understands and agrees to undergo procedure today. Consent was signed. Time out was performed. Urine HCG was negative. A Graves speculum was placed and the cervix was brought into view.  The cervix was prepped with Betadine. A single-toothed tenaculum was  placed on the anterior lip of the cervix for traction. A 3 mm pipelle was introduced through the cervix into the endometrial cavity without difficulty to a depth of 8cm, and a moderate amount of tissue was obtained, the resulting specime sent to pathology. The instruments were removed from the patient's vagina. Minimal bleeding from the  cervix was noted. The patient tolerated the procedure well. Routine post-procedure instructions were given to the patient.  She will be contacted by phone one results become available.      All specimens were labeled and sent to pathology.   Patient was given post procedure instructions.  Will follow up pathology and manage accordingly.  Routine preventative health maintenance measures emphasized.  OBGyn Exam  Kellie Mood, MD, [facility], [facility]

## 2020-10-24 NOTE — Progress Notes (Signed)
   GYNECOLOGY CLINIC COLPOSCOPY PROCEDURE NOTE  35 y.o. [id number] here for colposcopy for  atypical glandular cells NOS and HPV negative   pap smear on 09/23/2020. Discussed underlying role for HPV infection in the development of cervical dysplasia, its natural history and progression/regression, need for surveillance.  Is the patient  pregnant: No LMP: No LMP recorded. Smoking status:  reports that she has never smoked. She has never used smokeless tobacco. High risk partner:No History of STD:  No Future fertility desired:  No  Blood pressure 122/78, height 5' 3.5" (1.613 m), weight 203 lb (92.1 kg), last menstrual period 09/29/2020.   Patient given informed consent, signed copy in the chart, time out was performed.  The patient was position in dorsal lithotomy position. Speculum was placed the cervix was visualized.   After application of acetic acid colposcopic inspection of the cervix was undertaken.   Colposcopy adequate, full visualization of transformation zone: Yes no visible lesions; corresponding biopsies obtained.   ECC specimen obtained:  Yes   ENDOMETRIAL BIOPSY     The indications for endometrial biopsy were reviewed.   Risks of the biopsy including cramping, bleeding, infection, uterine perforation, inadequate specimen and need for additional procedures  were discussed. The patient states she understands and agrees to undergo procedure today. Consent was signed. Time out was performed. Urine HCG was negative. A Graves speculum was placed and the cervix was brought into view.  The cervix was prepped with Betadine. A single-toothed tenaculum was  placed on the anterior lip of the cervix for traction. A 3 mm pipelle was introduced through the cervix into the endometrial cavity without difficulty to a depth of 8cm, and a moderate amount of tissue was obtained, the resulting specime sent to pathology. The instruments were removed from the patient's vagina. Minimal bleeding from the  cervix was noted. The patient tolerated the procedure well. Routine post-procedure instructions were given to the patient.  She will be contacted by phone one results become available.      All specimens were labeled and sent to pathology.   Patient was given post procedure instructions.  Will follow up pathology and manage accordingly.  Routine preventative health maintenance measures emphasized.  OBGyn Exam  [name], MD, [facility], [facility]

## 2020-10-27 ENCOUNTER — Other Ambulatory Visit: Payer: Self-pay | Admitting: Obstetrics and Gynecology

## 2020-10-27 LAB — SURGICAL PATHOLOGY

## 2020-10-27 MED ORDER — FLUCONAZOLE 150 MG PO TABS: 150.0000 mg | ORAL_TABLET | Freq: Once | ORAL | 0 refills | Status: DC

## 2020-10-27 MED ORDER — FLUCONAZOLE 150 MG PO TABS: 150.0000 mg | ORAL_TABLET | Freq: Once | ORAL | 0 refills | Status: AC

## 2020-11-03 ENCOUNTER — Other Ambulatory Visit: Payer: Self-pay | Admitting: Obstetrics and Gynecology

## 2020-11-03 ENCOUNTER — Telehealth: Payer: Self-pay

## 2020-11-03 NOTE — Telephone Encounter (Signed)
Called patient to schedule Hysteroscopy D&C, Mirena IUD insertion w Staebler  DOS 10/18  H&P DOS  Pre-admit phone call appointment to be requested. All appointments will be updated on pt MyChart. Explained that this appointment has a call window. Based on the time scheduled will indicate if the call will be received within a 4 hour window before 1:00 or after.  Advised that pt may also receive calls from the hospital pharmacy and pre-service center.  Confirmed pt has [employer] as Chartered certified accountant. No secondary insurance.

## 2020-11-03 NOTE — Telephone Encounter (Signed)
-----   Message from [name], MD sent at 10/27/2020  2:44 PM EDT ----- Regarding: Surgery Surgery Booking Request Patient Full Name:  Kellie Davis  MRN: [id number]  DOB: [birth year]  Surgeon: [name], MD  Requested Surgery Date and Time: 2-4 week Primary Diagnosis AND Code: Endometrial polyp Secondary Diagnosis and Code:  Surgical Procedure: Hysteroscopy D&C, Mirena IUD insertion RNFA Requested?: No L&D Notification: No Admission Status: same day surgery Length of Surgery: 50 min Special Case Needs: No H&P: No can be done day of surgery Phone Interview???:  Yes Interpreter: No Medical Clearance:  No Special Scheduling Instructions: No Any known health/anesthesia issues, diabetes, sleep apnea, latex allergy, defibrillator/pacemaker?: No Acuity: P2   (P1 highest, P2 delay may cause harm, P3 low, elective gyn, P4 lowest) Post op follow up visits: 1 week and 6 week

## 2020-11-10 ENCOUNTER — Encounter
Admission: RE | Admit: 2020-11-10 | Discharge: 2020-11-10 | Disposition: A | Discharge status: Home / Self Care | Payer: BC Managed Care – PPO | Source: Ambulatory Visit | Attending: Obstetrics and Gynecology | Admitting: Obstetrics and Gynecology

## 2020-11-10 ENCOUNTER — Other Ambulatory Visit: Payer: Self-pay

## 2020-11-10 HISTORY — DX: Nausea with vomiting, unspecified: R11.2

## 2020-11-10 HISTORY — DX: Other specified postprocedural states: Z98.890

## 2020-11-10 NOTE — Patient Instructions (Addendum)
Your procedure is scheduled on: Tuesday 11/18/20 Report to the Registration Desk on the 1st floor of the [facility]. To find out your arrival time, please call [phone] between 1PM - 3PM on: Monday 11/17/20  REMEMBER: Instructions that are not followed completely may result in serious medical risk, up to and including death; or upon the discretion of your surgeon and anesthesiologist your surgery may need to be rescheduled.  Do not eat food after midnight the night before surgery.  No gum chewing, lozengers or hard candies.  You may however, drink CLEAR liquids up to 2 hours before you are scheduled to arrive for your surgery. Do not drink anything within 2 hours of your scheduled arrival time.  Clear liquids include: - water  - apple juice without pulp - gatorade (not RED, PURPLE, OR BLUE) - black coffee or tea (Do NOT add milk or creamers to the coffee or tea) Do NOT drink anything that is not on this list.  TAKE THESE MEDICATIONS THE MORNING OF SURGERY WITH A SIP OF WATER: buPROPion (WELLBUTRIN XL) 300 MG 24 hr tablet  Take drospirenone-ethinyl estradiol (SYEDA) 3-0.03 MG tablet  One week prior to surgery: phentermine (ADIPEX-P) 37.5 MG tablet Stop Anti-inflammatories (NSAIDS) such as Advil, Aleve, Ibuprofen, Motrin, Naproxen, Naprosyn and Aspirin based products such as Excedrin, Goodys Powder, BC Powder. Stop ANY OVER THE COUNTER supplements until after surgery. You may however, continue to take Tylenol if needed for pain up until the day of surgery.  No Alcohol for 24 hours before or after surgery.  No Smoking including e-cigarettes for 24 hours prior to surgery.  No chewable tobacco products for at least 6 hours prior to surgery.  No nicotine patches on the day of surgery.  Do not use any "recreational" drugs for at least a week prior to your surgery.  Please be advised that the combination of cocaine and anesthesia may have negative outcomes, up to and including  death. If you test positive for cocaine, your surgery will be cancelled.  On the morning of surgery brush your teeth with toothpaste and water, you may rinse your mouth with mouthwash if you wish. Do not swallow any toothpaste or mouthwash.  Do not wear jewelry, make-up, hairpins, clips or nail polish.  Do not wear lotions, powders, or perfumes.   Do not shave body from the neck down 48 hours prior to surgery just in case you cut yourself which could leave a site for infection.   Do not bring valuables to the hospital. [facility] is not responsible for any missing/lost belongings or valuables.   Notify your doctor if there is any change in your medical condition (cold, fever, infection).  Wear comfortable clothing (specific to your surgery type) to the hospital.  After surgery, you can help prevent lung complications by doing breathing exercises.  Take deep breaths and cough every 1-2 hours.   If you are being discharged the day of surgery, you will not be allowed to drive home. You will need a responsible adult (18 years or older) to drive you home and stay with you that night.   If you are taking public transportation, you will need to have a responsible adult (18 years or older) with you. Please confirm with your physician that it is acceptable to use public transportation.   Please call the [facility] at [phone] if you have any questions about these instructions.  Surgery Visitation Policy:  Patients undergoing a surgery or procedure may  have one family member or support person with them as long as that person is not COVID-19 positive or experiencing its symptoms.  That person may remain in the waiting area during the procedure and may rotate out with other people.  Inpatient Visitation:    Visiting hours are 7 a.m. to 8 p.m. Up to two visitors ages 16+ are allowed at one time in a patient room. The visitors may rotate out with other people during  the day. Visitors must check out when they leave, or other visitors will not be allowed. One designated support person may remain overnight. The visitor must pass COVID-19 screenings, use hand sanitizer when entering and exiting the patient's room and wear a mask at all times, including in the patient's room. Patients must also wear a mask when staff or their visitor are in the room. Masking is required regardless of vaccination status.

## 2020-11-12 ENCOUNTER — Encounter: Payer: Self-pay | Admitting: Dermatology

## 2020-11-12 ENCOUNTER — Ambulatory Visit (INDEPENDENT_AMBULATORY_CARE_PROVIDER_SITE_OTHER): Payer: BC Managed Care – PPO | Admitting: Dermatology

## 2020-11-12 ENCOUNTER — Other Ambulatory Visit: Payer: Self-pay

## 2020-11-12 DIAGNOSIS — D229 Melanocytic nevi, unspecified: Secondary | ICD-10-CM

## 2020-11-12 DIAGNOSIS — L814 Other melanin hyperpigmentation: Secondary | ICD-10-CM | POA: Diagnosis not present

## 2020-11-12 DIAGNOSIS — D2239 Melanocytic nevi of other parts of face: Secondary | ICD-10-CM | POA: Diagnosis not present

## 2020-11-12 MED ORDER — TRIAMCINOLONE ACETONIDE 0.025 % EX CREA
1.0000 | TOPICAL_CREAM | Freq: Two times a day (BID) | CUTANEOUS | 0 refills | Status: DC
Start: 2020-11-12 — End: 2021-02-04

## 2020-11-12 NOTE — Progress Notes (Deleted)
   New Patient Visit  Subjective  SARIKA BALDINI is a 35 y.o. female who presents for the following: Skin Problem (Patient here today for 2 spots at face, esthetician suggested she have them checked. Patient has not noticed any change and advises they have been there for quite some time. Patient also with a scar at right cheek/dark spot that has gotten darker since breaking out in hives at the face. /).  No history of skin cancer.  The following portions of the chart were reviewed this encounter and updated as appropriate:       Review of Systems:  No other skin or systemic complaints except as noted in HPI or Assessment and Plan.  Objective  Well appearing patient in no apparent distress; mood and affect are within normal limits.  A focused examination was performed including face, ears. Relevant physical exam findings are noted in the Assessment and Plan.    Assessment & Plan  Nevus right cheek  Vs angiofibroma  Benign-appearing.  Observation.  Call clinic for new or changing lesions.  Recommend daily use of broad spectrum spf 30+ sunscreen to sun-exposed areas.    Other melanin hyperpigmentation Right Buccal Cheek  Melanocytic Nevi - Tan-brown and/or pink-flesh-colored symmetric macules and papules - Benign appearing on exam today - Observation - Call clinic for new or changing moles - Recommend daily use of broad spectrum spf 30+ sunscreen to sun-exposed areas.   No follow-ups on file.  [name], RMA, am acting as scribe for Forest Gleason, MD .

## 2020-11-12 NOTE — Progress Notes (Signed)
   New Patient Visit  Subjective  Kellie Davis is a 35 y.o. female who presents for the following: Skin Problem (Patient here today for 2 spots at face, esthetician suggested she have them checked. Patient has not noticed any change and advises they have been there for quite some time. Patient also with a scar at right cheek/dark spot that has gotten darker since breaking out in hives at the face. /).  Patient with no hx of skin cancer.   Patient advises she gets tingling around the mouth prior to breaking out in hives. The hives last for at least 30 minutes.  The following portions of the chart were reviewed this encounter and updated as appropriate:   Tobacco  Allergies  Meds  Problems  Med Hx  Surg Hx  Fam Hx      Review of Systems:  No other skin or systemic complaints except as noted in HPI or Assessment and Plan.  Objective  Well appearing patient in no apparent distress; mood and affect are within normal limits.  A focused examination was performed including face, ears. Relevant physical exam findings are noted in the Assessment and Plan.  right cheek Small brown papules at face and left ear  perioral Hyperpigmented macules and patches perioral   Assessment & Plan  Nevus right cheek  Right cheek - nevus vs angiofibroma - benign appearing  Benign-appearing.  Observation.  Call clinic for new or changing lesions.  Recommend daily use of broad spectrum spf 30+ sunscreen to sun-exposed areas.    Other melanin hyperpigmentation perioral  Occurred after inflammation in perioral region; improved and then darkened after repeat inflammation in perioral region. No known trigger for inflammation - no change in soap, food, moisturizers, etc. No OTC medication use or change in medications.  Favor post inflammatory hyperpigmentation.  Discussed treating with hydroquinone. Patient is not currently using and has not been on hydroquinone in the last three months.    Start TMC 0.25% cream twice daily for up to 1 week if she develops inflammation/breakout in this area again.  Will prescribe Skin Medicinals Hydroquinone 8%/kojic acid/vitamin C cream pea sized amount once daily to the entire face for up to 2 months. This cannot be used more than 2 months due to risk of exogenous ochronosis (permanent dark spots). The patient was advised this is not covered by insurance since it is made by a compounding pharmacy. They will receive an email to check out and the medication will be mailed to their home.   Patient to send [place] a photo in 1 month for update. May consider switching to SM hydroquinone 12%/kojic acid/vit C cream  triamcinolone (KENALOG) 0.025 % cream - perioral Apply 1 application topically 2 (two) times daily. For up to 1 week as needed for hive break outs.  Return in about 2 months (around 01/12/2021).  [name], RMA, am acting as scribe for Forest Gleason, MD .  Documentation: I have reviewed the above documentation for accuracy and completeness, and I agree with the above.  Forest Gleason, MD

## 2020-11-12 NOTE — Progress Notes (Deleted)
   New Patient Visit  Subjective  Kellie Davis is a 35 y.o. female who presents for the following: No chief complaint on file..  ***  The following portions of the chart were reviewed this encounter and updated as appropriate:       Review of Systems:  No other skin or systemic complaints except as noted in HPI or Assessment and Plan.  Objective  Well appearing patient in no apparent distress; mood and affect are within normal limits.  {[id number]::"F full examination was performed including scalp, head, eyes, ears, nose, lips, neck, chest, axillae, abdomen, back, buttocks, bilateral upper extremities, bilateral lower extremities, hands, feet, fingers, toes, fingernails, and toenails. All findings within normal limits unless otherwise noted below."}    Assessment & Plan   No follow-ups on file.

## 2020-11-12 NOTE — Patient Instructions (Addendum)
Start TMC 0.25% cream twice daily for up to 1 week with hive breakouts.   Will prescribe [facility] Hydroquinone 8%/kojic acid/vitamin C cream pea sized amount once daily to the entire face for up to 2 months. This cannot be used more than 3 months due to risk of exogenous ochronosis (permanent dark spots). The patient was advised this is not covered by insurance since it is made by a compounding pharmacy. They will receive an email to check out and the medication will be mailed to their home.   Instructions for [facility] Medications  One or more of your medications was sent to the [facility] mail order compounding pharmacy. You will receive an email from them and can purchase the medicine through that link. It will then be mailed to your home at the address you confirmed. If for any reason you do not receive an email from them, please check your spam folder. If you still do not find the email, please let us know. [facility] phone number is [phone].  If you have any questions or concerns for your doctor, please call our main line at [phone] and press option 4 to reach your doctor's medical assistant. If no one answers, please leave a voicemail as directed and we will return your call as soon as possible. Messages left after 4 pm will be answered the following business day.   You may also send [place] a message via [place]. We typically respond to MyChart messages within 1-2 business days.  For prescription refills, please ask your pharmacy to contact our office. Our fax number is [phone].  If you have an urgent issue when the clinic is closed that cannot wait until the next business day, you can page your doctor at the number below.    Please note that while we do our best to be available for urgent issues outside of office hours, we are not available 24/7.   If you have an urgent issue and are unable to reach [place], you may choose to seek medical care at your doctor's  office, retail clinic, urgent care center, or emergency room.  If you have a medical emergency, please immediately call 911 or go to the emergency department.  Pager Numbers  - Dr. Nehemiah Massed: [phone]  - Dr. Laurence Ferrari: [phone]  - Dr. Nicole Kindred: [phone]  In the event of inclement weather, please call our main line at [phone] for an update on the status of any delays or closures.  Dermatology Medication Tips: Please keep the boxes that topical medications come in in order to help keep track of the instructions about where and how to use these. Pharmacies typically print the medication instructions only on the boxes and not directly on the medication tubes.   If your medication is too expensive, please contact our office at [phone] option 4 or send [place] a message through [place].   We are unable to tell what your co-pay for medications will be in advance as this is different depending on your insurance coverage. However, we may be able to find a substitute medication at lower cost or fill out paperwork to get insurance to cover a needed medication.   If a prior authorization is required to get your medication covered by your insurance company, please allow [place] 1-2 business days to complete this process.  Drug prices often vary depending on where the prescription is filled and some pharmacies may offer cheaper prices.  The website www.goodrx.com contains coupons for medications through different  pharmacies. The prices here do not account for what the cost may be with help from insurance (it may be cheaper with your insurance), but the website can give you the price if you did not use any insurance.  - You can print the associated coupon and take it with your prescription to the pharmacy.  - You may also stop by our office during regular business hours and pick up a GoodRx coupon card.  - If you need your prescription sent electronically to a different pharmacy, notify our office  through [facility] or by phone at [phone] option 4.

## 2020-11-18 ENCOUNTER — Ambulatory Visit: Payer: BC Managed Care – PPO | Admitting: Certified Registered"

## 2020-11-18 ENCOUNTER — Ambulatory Visit
Admission: RE | Admit: 2020-11-18 | Discharge: 2020-11-18 | Disposition: A | Discharge status: Home / Self Care | Payer: BC Managed Care – PPO | Attending: Obstetrics and Gynecology | Admitting: Obstetrics and Gynecology

## 2020-11-18 ENCOUNTER — Encounter
Admission: RE | Disposition: A | Discharge status: Home / Self Care | Payer: Self-pay | Source: Home / Self Care | Attending: Obstetrics and Gynecology

## 2020-11-18 ENCOUNTER — Other Ambulatory Visit: Payer: Self-pay

## 2020-11-18 ENCOUNTER — Encounter: Payer: Self-pay | Admitting: Obstetrics and Gynecology

## 2020-11-18 DIAGNOSIS — Z30430 Encounter for insertion of intrauterine contraceptive device: Secondary | ICD-10-CM

## 2020-11-18 DIAGNOSIS — N840 Polyp of corpus uteri: Secondary | ICD-10-CM | POA: Diagnosis not present

## 2020-11-18 DIAGNOSIS — N939 Abnormal uterine and vaginal bleeding, unspecified: Secondary | ICD-10-CM

## 2020-11-18 HISTORY — PX: INTRAUTERINE DEVICE (IUD) INSERTION: SHX5877

## 2020-11-18 HISTORY — PX: HYSTEROSCOPY WITH D & C: SHX1775

## 2020-11-18 LAB — POCT PREGNANCY, URINE: Preg Test, Ur: NEGATIVE

## 2020-11-18 SURGERY — DILATATION AND CURETTAGE /HYSTEROSCOPY
Anesthesia: General

## 2020-11-18 MED ORDER — CHLORHEXIDINE GLUCONATE 0.12 % MT SOLN: 15.0000 mL | Freq: Once | OROMUCOSAL | Status: AC

## 2020-11-18 MED ORDER — PROPOFOL 10 MG/ML IV BOLUS
INTRAVENOUS | Status: DC | PRN
Start: 2020-11-18 — End: 2020-11-18
  Administered 2020-11-18: 200 mg via INTRAVENOUS

## 2020-11-18 MED ORDER — FAMOTIDINE 20 MG PO TABS: 20.0000 mg | ORAL_TABLET | Freq: Once | ORAL | Status: AC

## 2020-11-18 MED ORDER — SILVER NITRATE-POT NITRATE 75-25 % EX MISC
CUTANEOUS | Status: AC
  Filled 2020-11-18: qty 10

## 2020-11-18 MED ORDER — MIDAZOLAM HCL 2 MG/2ML IJ SOLN
INTRAMUSCULAR | Status: DC | PRN
  Administered 2020-11-18: 2 mg via INTRAVENOUS

## 2020-11-18 MED ORDER — ACETAMINOPHEN 10 MG/ML IV SOLN
INTRAVENOUS | Status: DC | PRN
  Administered 2020-11-18: 1000 mg via INTRAVENOUS

## 2020-11-18 MED ORDER — LIDOCAINE HCL (CARDIAC) PF 100 MG/5ML IV SOSY
PREFILLED_SYRINGE | INTRAVENOUS | Status: DC | PRN
  Administered 2020-11-18: 100 mg via INTRAVENOUS

## 2020-11-18 MED ORDER — FENTANYL CITRATE (PF) 100 MCG/2ML IJ SOLN: 25.0000 ug | INTRAMUSCULAR | Status: DC | PRN

## 2020-11-18 MED ORDER — SCOPOLAMINE 1 MG/3DAYS TD PT72
1.0000 | MEDICATED_PATCH | TRANSDERMAL | Status: DC
  Administered 2020-11-18: 1.5 mg via TRANSDERMAL

## 2020-11-18 MED ORDER — ONDANSETRON HCL 4 MG/2ML IJ SOLN
INTRAMUSCULAR | Status: DC | PRN
  Administered 2020-11-18 (×2): 4 mg via INTRAVENOUS

## 2020-11-18 MED ORDER — ONDANSETRON HCL 4 MG/2ML IJ SOLN: 4.0000 mg | Freq: Once | INTRAMUSCULAR | Status: DC | PRN

## 2020-11-18 MED ORDER — SCOPOLAMINE 1 MG/3DAYS TD PT72
MEDICATED_PATCH | TRANSDERMAL | Status: AC
  Filled 2020-11-18: qty 1

## 2020-11-18 MED ORDER — 0.9 % SODIUM CHLORIDE (POUR BTL) OPTIME
TOPICAL | Status: DC | PRN
  Administered 2020-11-18: 500 mL

## 2020-11-18 MED ORDER — SILVER NITRATE-POT NITRATE 75-25 % EX MISC
CUTANEOUS | Status: DC | PRN
  Administered 2020-11-18: 1

## 2020-11-18 MED ORDER — FENTANYL CITRATE (PF) 100 MCG/2ML IJ SOLN
INTRAMUSCULAR | Status: DC | PRN
  Administered 2020-11-18 (×2): 25 ug via INTRAVENOUS

## 2020-11-18 MED ORDER — POVIDONE-IODINE 10 % EX SWAB: 2.0000 "application " | Freq: Once | CUTANEOUS | Status: DC

## 2020-11-18 MED ORDER — FENTANYL CITRATE (PF) 100 MCG/2ML IJ SOLN
INTRAMUSCULAR | Status: AC
  Filled 2020-11-18: qty 2

## 2020-11-18 MED ORDER — MIDAZOLAM HCL 2 MG/2ML IJ SOLN
INTRAMUSCULAR | Status: AC
  Filled 2020-11-18: qty 2

## 2020-11-18 MED ORDER — PHENYLEPHRINE HCL (PRESSORS) 10 MG/ML IV SOLN
INTRAVENOUS | Status: DC | PRN
  Administered 2020-11-18 (×2): 100 ug via INTRAVENOUS

## 2020-11-18 MED ORDER — CHLORHEXIDINE GLUCONATE 0.12 % MT SOLN
OROMUCOSAL | Status: AC
  Administered 2020-11-18: 15 mL via OROMUCOSAL
  Filled 2020-11-18: qty 15

## 2020-11-18 MED ORDER — MEPERIDINE HCL 25 MG/ML IJ SOLN: 6.2500 mg | INTRAMUSCULAR | Status: DC | PRN

## 2020-11-18 MED ORDER — SODIUM CHLORIDE 0.9 % IR SOLN
Status: DC | PRN
  Administered 2020-11-18: 800 mL

## 2020-11-18 MED ORDER — EPHEDRINE SULFATE 50 MG/ML IJ SOLN
INTRAMUSCULAR | Status: DC | PRN
  Administered 2020-11-18: 5 mg via INTRAVENOUS

## 2020-11-18 MED ORDER — KETOROLAC TROMETHAMINE 30 MG/ML IJ SOLN
INTRAMUSCULAR | Status: DC | PRN
  Administered 2020-11-18: 30 mg via INTRAVENOUS

## 2020-11-18 MED ORDER — LEVONORGESTREL 20 MCG/DAY IU IUD
1.0000 | INTRAUTERINE_SYSTEM | INTRAUTERINE | Status: AC
  Administered 2020-11-18: 1 via INTRAUTERINE
  Filled 2020-11-18: qty 1

## 2020-11-18 MED ORDER — LACTATED RINGERS IV SOLN: INTRAVENOUS | Status: DC

## 2020-11-18 MED ORDER — LEVONORGESTREL 20 MCG/DAY IU IUD
INTRAUTERINE_SYSTEM | INTRAUTERINE | Status: AC
  Filled 2020-11-18: qty 1

## 2020-11-18 MED ORDER — ONDANSETRON HCL 4 MG/2ML IJ SOLN
INTRAMUSCULAR | Status: AC
  Filled 2020-11-18: qty 2

## 2020-11-18 MED ORDER — IBUPROFEN 600 MG PO TABS: 600.0000 mg | ORAL_TABLET | Freq: Four times a day (QID) | ORAL | 0 refills | Status: AC | PRN

## 2020-11-18 MED ORDER — FAMOTIDINE 20 MG PO TABS
ORAL_TABLET | ORAL | Status: AC
  Administered 2020-11-18: 20 mg via ORAL
  Filled 2020-11-18: qty 1

## 2020-11-18 MED ORDER — ORAL CARE MOUTH RINSE: 15.0000 mL | Freq: Once | OROMUCOSAL | Status: AC

## 2020-11-18 MED ORDER — HYDROMORPHONE HCL 2 MG PO TABS: 2.0000 mg | ORAL_TABLET | ORAL | 0 refills | Status: AC | PRN

## 2020-11-18 MED ORDER — GLYCOPYRROLATE 0.2 MG/ML IJ SOLN
INTRAMUSCULAR | Status: DC | PRN
  Administered 2020-11-18: .2 mg via INTRAVENOUS

## 2020-11-18 MED ORDER — DEXAMETHASONE SODIUM PHOSPHATE 10 MG/ML IJ SOLN
INTRAMUSCULAR | Status: DC | PRN
  Administered 2020-11-18: 10 mg via INTRAVENOUS

## 2020-11-18 MED ORDER — DEXMEDETOMIDINE (PRECEDEX) IN NS 20 MCG/5ML (4 MCG/ML) IV SYRINGE
PREFILLED_SYRINGE | INTRAVENOUS | Status: DC | PRN
  Administered 2020-11-18: 12 ug via INTRAVENOUS
  Administered 2020-11-18: 8 ug via INTRAVENOUS
  Administered 2020-11-18: 200 ug via INTRAVENOUS

## 2020-11-18 MED ORDER — ACETAMINOPHEN 10 MG/ML IV SOLN
INTRAVENOUS | Status: AC
  Filled 2020-11-18: qty 100

## 2020-11-18 SURGICAL SUPPLY — 30 items
CATH ROBINSON RED A/P 16FR (CATHETERS) ×2 IMPLANT
DEVICE MYOSURE LITE (MISCELLANEOUS) ×2 IMPLANT
DEVICE MYOSURE REACH (MISCELLANEOUS) IMPLANT
DRSG TELFA 3X8 NADH (GAUZE/BANDAGES/DRESSINGS) ×2 IMPLANT
ELECT REM PT RETURN 9FT ADLT (ELECTROSURGICAL)
ELECTRODE REM PT RTRN 9FT ADLT (ELECTROSURGICAL) IMPLANT
GAUZE 4X4 16PLY ~~LOC~~+RFID DBL (SPONGE) ×2 IMPLANT
GLOVE SURG ENC MOIS LTX SZ7 (GLOVE) ×2 IMPLANT
GLOVE SURG UNDER LTX SZ7.5 (GLOVE) ×2 IMPLANT
GOWN STRL REUS W/ TWL LRG LVL3 (GOWN DISPOSABLE) ×2 IMPLANT
GOWN STRL REUS W/TWL LRG LVL3 (GOWN DISPOSABLE) ×4
INFUSOR MANOMETER BAG 3000ML (MISCELLANEOUS) IMPLANT
IV LACTATED RINGER IRRG 3000ML (IV SOLUTION)
IV LR IRRIG 3000ML ARTHROMATIC (IV SOLUTION) IMPLANT
IV NS 1000ML (IV SOLUTION) ×2
IV NS 1000ML BAXH (IV SOLUTION) ×1 IMPLANT
IV NS IRRIG 3000ML ARTHROMATIC (IV SOLUTION) IMPLANT
KIT PROCEDURE FLUENT (KITS) IMPLANT
KIT TURNOVER CYSTO (KITS) ×2 IMPLANT
MANIFOLD NEPTUNE II (INSTRUMENTS) ×2 IMPLANT
Mirena ×2 IMPLANT
PACK DNC HYST (MISCELLANEOUS) ×2 IMPLANT
PAD OB MATERNITY 4.3X12.25 (PERSONAL CARE ITEMS) IMPLANT
PAD PREP 24X41 OB/GYN DISP (PERSONAL CARE ITEMS) ×2 IMPLANT
SCRUB EXIDINE 4% CHG 4OZ (MISCELLANEOUS) ×2 IMPLANT
SEAL ROD LENS SCOPE MYOSURE (ABLATOR) ×2 IMPLANT
SET CYSTO W/LG BORE CLAMP LF (SET/KITS/TRAYS/PACK) ×2 IMPLANT
TOWEL OR 17X26 4PK STRL BLUE (TOWEL DISPOSABLE) ×2 IMPLANT
TUBING CONNECTING 10 (TUBING) IMPLANT
WATER STERILE IRR 500ML POUR (IV SOLUTION) IMPLANT

## 2020-11-18 NOTE — Anesthesia Preprocedure Evaluation (Signed)
Anesthesia Evaluation  Patient identified by MRN, date of birth, ID band Patient awake    Reviewed: Allergy & Precautions, NPO status , Patient's Chart, lab work & pertinent test results  History of Anesthesia Complications (+) PONV and history of anesthetic complications  Airway Mallampati: II  TM Distance: >3 FB Neck ROM: Full    Dental no notable dental hx.    Pulmonary neg sleep apnea, neg COPD, Not current smoker,    Pulmonary exam normal        Cardiovascular (-) hypertension(-) Past MI and (-) CHF Normal cardiovascular exam(-) dysrhythmias (-) Valvular Problems/Murmurs     Neuro/Psych neg Seizures (febrile sz as a child, no meds) PSYCHIATRIC DISORDERS Anxiety    GI/Hepatic Neg liver ROS, GERD  ,  Endo/Other  neg diabetesHypothyroidism (no meds)   Renal/GU negative Renal ROS     Musculoskeletal   Abdominal   Peds  Hematology   Anesthesia Other Findings Anxiety disorder, unspecified  Situational  Hypothyroidism    PONV (postoperative nausea and vomiting)       Reproductive/Obstetrics                             Anesthesia Physical  Anesthesia Plan  ASA: 2  Anesthesia Plan: General   Post-op Pain Management:    Induction: Intravenous  PONV Risk Score and Plan: 4 or greater and Propofol infusion, Scopolamine patch - Pre-op, Ondansetron and Midazolam  Airway Management Planned: LMA  Additional Equipment:   Intra-op Plan:   Post-operative Plan: Extubation in OR  Informed Consent: I have reviewed the patients History and Physical, chart, labs and discussed the procedure including the risks, benefits and alternatives for the proposed anesthesia with the patient or authorized representative who has indicated his/her understanding and acceptance.       Plan Discussed with: CRNA, Anesthesiologist and Surgeon  Anesthesia Plan Comments:         Anesthesia Quick  Evaluation

## 2020-11-18 NOTE — Transfer of Care (Signed)
Immediate Anesthesia Transfer of Care Note  Patient: Nelva Bush  Procedure(s) Performed: DILATATION AND CURETTAGE /HYSTEROSCOPY INTRAUTERINE DEVICE (IUD) INSERTION, MIRENA  Patient Location: PACU  Anesthesia Type:General  Level of Consciousness: drowsy and patient cooperative  Airway & Oxygen Therapy: Patient Spontanous Breathing and Patient connected to face mask oxygen  Post-op Assessment: Report given to RN and Post -op Vital signs reviewed and stable  Post vital signs: Reviewed and stable  Last Vitals:  Vitals Value Taken Time  BP 108/61 11/18/20 1100  Temp    Pulse 97 11/18/20 1101  Resp 17 11/18/20 1101  SpO2 100 % 11/18/20 1101  Vitals shown include unvalidated device data.  Last Pain:  Vitals:   11/18/20 1100  TempSrc:   PainSc: Asleep         Complications: No notable events documented.

## 2020-11-18 NOTE — Anesthesia Procedure Notes (Signed)
Procedure Name: LMA Insertion Date/Time: 11/18/2020 10:10 AM Performed by: Kelton Pillar, CRNA Pre-anesthesia Checklist: Patient identified, Emergency Drugs available, Suction available and Patient being monitored Patient Re-evaluated:Patient Re-evaluated prior to induction Oxygen Delivery Method: Circle system utilized Preoxygenation: Pre-oxygenation with 100% oxygen Induction Type: IV induction Ventilation: Mask ventilation without difficulty LMA: LMA inserted LMA Size: 4.0 Placement Confirmation: positive ETCO2, CO2 detector and breath sounds checked- equal and bilateral Tube secured with: Tape Dental Injury: Teeth and Oropharynx as per pre-operative assessment

## 2020-11-18 NOTE — Op Note (Signed)
Preoperative Diagnosis: 1) 35 y.o. with abnormal uterine bleeding  2) Endometrial polyp  Postoperative Diagnosis: 1) 35 y.o. with abnormal uterine bleeding  2) Endometrial polyp  Operation Performed: Hysteroscopy, dilation and curettage and Mirena IUD insertion  Indication: Abnormal uterine bleeding with in office endometrial biopsy read as benign endometrial polyp  Anesthesia: General LMA  Primary Surgeon: [name], MD  Assistant: none  Preoperative Antibiotics: none  Estimated Blood Loss: minimal  IV Fluids: 96mL  Urine Output:: ~70mL straigt cath  Drains or Tubes: none  Implants: none  Specimens Removed: endometrial curettings  Complications: none  Intraoperative Findings:  Normal cervix, uterine cavity with possible small polyp remnant noted on the posterior fundal wall.  Normal tubal ostia bilaterally.    Patient Condition: stable  Procedure in Detail:  Patient was taken to the operating room were she was administered general endotracheal anesthesia.  She was positioned in the dorsal lithotomy position utilizing Allen stirups, prepped and draped in the usual sterile fashion.  Uterus was noted to be non-enlarged in size, anteverted.   Prior to proceeding with the case a time out was performed.  Attention was turned to the patient's pelvis.  A red rubber catheter was used to empty the patient's bladder.  An operative speculum was placed to allow visualization of the cervix.  The anterior lip of the cervix was grasped with a single tooth tenaculum and the cervix was sequentially dilated using pratt dilators.  The hysteroscope was then advanced into the uterine cavity noting the above findings.  Curettage was performed using a Myosure device and the resulting specimen collected and sent to pathology.       Uterus sounded to 9.5cm. IUD placed per manufacturer's recommendations.  Strings trimmed to 3 cm.    The single tooth tenaculum was removed from the cervix.  The  tenaculum sites and cervix were noted to be  Hemostatic before removing the operative speculum.  Sponge needle and instrument counts were corrects times two.  The patient tolerated the procedure well and was taken to the recovery room in stable condition.

## 2020-11-18 NOTE — Anesthesia Postprocedure Evaluation (Signed)
Anesthesia Post Note  Patient: Kellie Davis  Procedure(s) Performed: DILATATION AND CURETTAGE /HYSTEROSCOPY INTRAUTERINE DEVICE (IUD) INSERTION, [place]  Patient location during evaluation: PACU Anesthesia Type: General Level of consciousness: awake and alert, awake and oriented Pain management: pain level controlled Vital Signs Assessment: post-procedure vital signs reviewed and stable Respiratory status: spontaneous breathing, nonlabored ventilation and respiratory function stable Cardiovascular status: blood pressure returned to baseline and stable Postop Assessment: no apparent nausea or vomiting Anesthetic complications: no   No notable events documented.   Last Vitals:  Vitals:   11/18/20 1200 11/18/20 1217  BP: 96/60 96/68  Pulse: 67 68  Resp: 15 16  Temp: 36.6 C (!) 36.2 C  SpO2: 100% 100%    Last Pain:  Vitals:   11/18/20 1217  TempSrc: Temporal  PainSc: 0-No pain                 Phill Mutter

## 2020-11-18 NOTE — Interval H&P Note (Signed)
History and Physical Interval Note:  11/18/2020 9:47 AM  Kellie Davis  has presented today for surgery, with the diagnosis of Endometrial polyp.  The various methods of treatment have been discussed with the patient and family. After consideration of risks, benefits and other options for treatment, the patient has consented to  Procedure(s): DILATATION AND CURETTAGE /HYSTEROSCOPY (N/A) INTRAUTERINE DEVICE (IUD) INSERTION, MIRENA (N/A) as a surgical intervention.  The patient's history has been reviewed, patient examined, no change in status, stable for surgery.  I have reviewed the patient's chart and labs.  Questions were answered to the patient's satisfaction.     [name]

## 2020-11-18 NOTE — Discharge Instructions (Signed)

## 2020-11-19 LAB — SURGICAL PATHOLOGY

## 2020-11-21 ENCOUNTER — Encounter: Payer: Self-pay | Admitting: Dermatology

## 2020-11-21 ENCOUNTER — Ambulatory Visit: Payer: BC Managed Care – PPO | Admitting: Obstetrics and Gynecology

## 2020-11-25 ENCOUNTER — Other Ambulatory Visit: Payer: Self-pay

## 2020-11-25 ENCOUNTER — Telehealth: Payer: Self-pay

## 2020-11-25 ENCOUNTER — Ambulatory Visit (INDEPENDENT_AMBULATORY_CARE_PROVIDER_SITE_OTHER): Payer: BC Managed Care – PPO | Admitting: Obstetrics and Gynecology

## 2020-11-25 ENCOUNTER — Encounter: Payer: Self-pay | Admitting: Obstetrics and Gynecology

## 2020-11-25 VITALS — BP 110/66 | Ht 64.0 in | Wt 203.0 lb

## 2020-11-25 DIAGNOSIS — L509 Urticaria, unspecified: Secondary | ICD-10-CM

## 2020-11-25 DIAGNOSIS — Z4889 Encounter for other specified surgical aftercare: Secondary | ICD-10-CM

## 2020-11-25 MED ORDER — PHENTERMINE HCL 37.5 MG PO TABS: 37.5000 mg | ORAL_TABLET | Freq: Every day | ORAL | 0 refills | Status: DC

## 2020-11-25 NOTE — Progress Notes (Signed)
      Postoperative Follow-up Patient presents post op from hysteroscopy, D&C, Mirena IUD insertion 1weeks ago for abnormal uterine bleeding.  Subjective: Patient reports some improvement in her preop symptoms. Eating a regular diet without difficulty. Pain is controlled without any medications.  Activity: normal activities of daily living.  Objective: Blood pressure 110/66, height 5\' 4"  (1.626 m), weight 203 lb (92.1 kg), last menstrual period 10/24/2020.  General: NAD Pulmonary: no increased work of breathing Neurologic: normal gait   Admission on 11/18/2020, Discharged on 11/18/2020  Component Date Value Ref Range Status   Preg Test, Ur 11/18/2020 NEGATIVE  NEGATIVE Final   Comment:        THE SENSITIVITY OF THIS METHODOLOGY IS >24 mIU/mL    SURGICAL PATHOLOGY 11/18/2020    Final-Edited                   Value:SURGICAL PATHOLOGY CASE: [id number] PATIENT: Felicity Coyer Surgical Pathology Report     Specimen Submitted: A. Endometrial curettings  Clinical History: Endometrial polyp     DIAGNOSIS: A. ENDOMETRIUM; MYOSURE CURETTAGE: - INACTIVE ENDOMETRIUM WITH FOCAL UNDERDEVELOPED SECRETORY CHANGES, SUGGESTIVE OF ORAL CONTRACEPTIVE EFFECT. - NEGATIVE FOR HYPERPLASIA, ATYPIA, AND MALIGNANCY.  GROSS DESCRIPTION: A. Labeled: Endometrial curettings Received: Fresh Collection time: 10:40 AM on 11/18/2020 Placed into formalin time: 11:28 AM on 11/18/2020 Tissue fragment(s): Multiple Size: Aggregate, 2.0 x 1.1 x 0.3 cm Description: Tan-pink soft tissue fragments Entirely submitted in 1 cassette.  [facility] 11/18/2020  Final Diagnosis performed by Bryan Lemma, MD.   Electronically signed 11/19/2020 5:59:48PM The electronic signature indicates that the named Attending Pathologist has evaluated the specimen Technical component performed at [facility], [address] [place], [place] [address] Lab: [phone] Dir: Rush Farmer, MD, MMM   Professional component performed at [facility], [facility], [place], [place], [place] [address] Lab: [phone] Dir: Kathi Simpers, MD     Assessment: 35 y.o. s/p hysteroscopy, dilation and curettage stable  Plan: Patient has done well after surgery with no apparent complications.  I have discussed the post-operative course to date, and the expected progress moving forward.  The patient understands what complications to be concerned about.  I will see the patient in routine follow up, or sooner if needed.    Activity plan: No restriction.  Refilled phentermine to restart postop  Return in about 5 weeks (around 12/30/2020) for 6 week postop.   [name], MD, [facility], [facility] 11/25/2020, 11:13 AM

## 2020-11-25 NOTE — Telephone Encounter (Signed)
Please thank patient for the photo (very helpful) and the update. Please also ask if she can think of any new exposures (medications, food, creams, soaps,  laundry detergents, etc)   I recommend continuing the Allegra daily and also using Benadryl as needed if it is still flared up. If she is not clear or if it comes back, I recommend increasing to Allegra twice a day. If needed we can go as high as two tablets of Allegra twice a day.   I would also recommend doing the triamcinolone 0.025% cream up to 1 week as long as it is inflamed to help prevent new dark spots.  I also recommend based on this photo, a referral to an allergist for allergy testing. If she is in agreement, we can place a referral. If there is a particular allergy clinic she would like to go to, we can refer there. Otherwise we can refer to the [facility] in [place]. Thank you!

## 2020-11-25 NOTE — Telephone Encounter (Signed)
Error

## 2020-11-25 NOTE — Progress Notes (Signed)
mb

## 2020-11-25 NOTE — Telephone Encounter (Signed)
Below message sent to patient via Mychart -  Dr. Laurence Ferrari said that the allegra will help much more for hives than the triamcinolone. If you have any swelling inside the mouth, beyond just a bit on the very inside edge of her lips, we could send you in an epi-pen to have just in case of progression to swelling at the throat and airway which can quickly become life threatening. If you have any difficulty breathing or tightening sensation at the throat, you should seek medical care immediately.   The triamcinolone really just lowers the chance of getting dark spots. Allegra regular and Hives are actually the same, just labelled differently so people know they can take for both issues. Referral was sent to [facility].

## 2020-11-25 NOTE — Telephone Encounter (Signed)
Spoke with patient and advised her the photo she sent was helpful. Patient advises she has not had any new exposures or has used any new products.  Patient said she woke up, showered and then felt the tingling start at mouth, hives lasted all day.     I told patient you recommend continuing the Allegra daily but patient would like to know if it's the regular Allegra. Currently she uses Allegra for hives but only when flared.    She has used the triamcinolone 0.025% cream but area of hives now is at inside of mouth.   Patient would like referral sent to [facility].

## 2020-11-25 NOTE — Telephone Encounter (Signed)
Please advise patient - allegra will help much more for hives than the triamcinolone. If she has any swelling inside the mouth, beyond just a bit on the very inside edge of her lips, I would send her an epi-pen to have just in case of progression to swelling at the throat and airway which can quickly become life threatening. If she has any difficulty breathing or tightening sensation at the throat, she should seek medical care immediately.  The [place] really just lowers the chance of getting dark spots. Allegra regular and Hives are actually the same, just labelled differently so people know they can take for both issues. Please send referral to [facility]. Thank you!

## 2020-11-26 ENCOUNTER — Telehealth: Payer: Self-pay

## 2020-11-26 NOTE — Telephone Encounter (Signed)
Referral faxed to [facility]

## 2021-01-02 ENCOUNTER — Ambulatory Visit: Payer: BC Managed Care – PPO | Admitting: Obstetrics and Gynecology

## 2021-01-07 ENCOUNTER — Ambulatory Visit (INDEPENDENT_AMBULATORY_CARE_PROVIDER_SITE_OTHER): Payer: BC Managed Care – PPO | Admitting: Obstetrics and Gynecology

## 2021-01-07 ENCOUNTER — Encounter: Payer: Self-pay | Admitting: Obstetrics and Gynecology

## 2021-01-07 ENCOUNTER — Other Ambulatory Visit: Payer: Self-pay

## 2021-01-07 VITALS — BP 112/68 | Wt 210.0 lb

## 2021-01-07 DIAGNOSIS — Z4889 Encounter for other specified surgical aftercare: Secondary | ICD-10-CM

## 2021-01-07 DIAGNOSIS — E669 Obesity, unspecified: Secondary | ICD-10-CM

## 2021-01-07 DIAGNOSIS — Z6836 Body mass index (BMI) 36.0-36.9, adult: Secondary | ICD-10-CM

## 2021-01-07 MED ORDER — PHENTERMINE HCL 37.5 MG PO TABS: 37.5000 mg | ORAL_TABLET | Freq: Every day | ORAL | 0 refills | Status: DC

## 2021-01-07 NOTE — Progress Notes (Signed)
      Postoperative Follow-up Patient presents post op from hysteroscopy, D&C, Mirena IUD insertion 6weeks ago for abnormal uterine bleeding.  Subjective: Patient reports some improvement in her preop symptoms. Eating a regular diet without difficulty. The patient is not having any pain.  Activity: normal activities of daily living.  Objective: Blood pressure 112/68, weight 210 lb (95.3 kg). Body mass index is 36.05 kg/m.  General: NAD Pulmonary: no increased work of breathing Abdomen: soft, non-tender, non-distended, incision(s) D/C/I GU: normal external female genitalia normal cervix, no CMT, uterus normal in shape and contour, no adnexal tenderness or masses.  Minimal old blood noted posterior vault less than one scopet IUD string visualized 3cm Extremities: no edema Neurologic: normal gait   Admission on 11/18/2020, Discharged on 11/18/2020  Component Date Value Ref Range Status   Preg Test, Ur 11/18/2020 NEGATIVE  NEGATIVE Final   Comment:        THE SENSITIVITY OF THIS METHODOLOGY IS >24 mIU/mL    SURGICAL PATHOLOGY 11/18/2020    Final-Edited                   Value:SURGICAL PATHOLOGY CASE: [id number] PATIENT: Kellie Davis Surgical Pathology Report     Specimen Submitted: A. Endometrial curettings  Clinical History: Endometrial polyp     DIAGNOSIS: A. ENDOMETRIUM; MYOSURE CURETTAGE: - INACTIVE ENDOMETRIUM WITH FOCAL UNDERDEVELOPED SECRETORY CHANGES, SUGGESTIVE OF ORAL CONTRACEPTIVE EFFECT. - NEGATIVE FOR HYPERPLASIA, ATYPIA, AND MALIGNANCY.  GROSS DESCRIPTION: A. Labeled: Endometrial curettings Received: Fresh Collection time: 10:40 AM on 11/18/2020 Placed into formalin time: 11:28 AM on 11/18/2020 Tissue fragment(s): Multiple Size: Aggregate, 2.0 x 1.1 x 0.3 cm Description: Tan-pink soft tissue fragments Entirely submitted in 1 cassette.  [facility] 11/18/2020  Final Diagnosis performed by Bryan Lemma, MD.   Electronically signed 11/19/2020  5:59:48PM The electronic signature indicates that the named Attending Pathologist has evaluated the specimen Technical component performed at [facility], [address] [place], [place] [address] Lab: [phone] Dir: Rush Farmer, MD, MMM  Professional component performed at [facility], [facility], [place], [place], [place] [address] Lab: [phone] Dir: Kathi Simpers, MD     Assessment: 35 y.o. s/p hysteroscopy, D&C, Mirena IUD insertion stable  Plan: Patient has done well after surgery with no apparent complications.  I have discussed the post-operative course to date, and the expected progress moving forward.  The patient understands what complications to be concerned about.  I will see the patient in routine follow up, or sooner if needed.    Activity plan: No restriction.  Restart phentermine  Return in about 4 weeks (around 02/04/2021) for medication follow up phone.    [name], MD, [facility], [facility] 01/07/2021, 9:16 AM

## 2021-01-14 ENCOUNTER — Ambulatory Visit (INDEPENDENT_AMBULATORY_CARE_PROVIDER_SITE_OTHER): Payer: BC Managed Care – PPO | Admitting: Dermatology

## 2021-01-14 ENCOUNTER — Other Ambulatory Visit: Payer: Self-pay

## 2021-01-14 ENCOUNTER — Encounter: Payer: Self-pay | Admitting: Dermatology

## 2021-01-14 DIAGNOSIS — L509 Urticaria, unspecified: Secondary | ICD-10-CM | POA: Diagnosis not present

## 2021-01-14 DIAGNOSIS — L814 Other melanin hyperpigmentation: Secondary | ICD-10-CM

## 2021-01-14 NOTE — Progress Notes (Signed)
° °  Follow-Up Visit   Subjective  Kellie Davis is a 35 y.o. 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 Patient currently using skin medicinals hydroquinone tretinoin daily. She does use TMC as needed for flares. Patient has not noticed any improvement, never was contacted by [facility]. ).  The following portions of the chart were reviewed this encounter and updated as appropriate:   Tobacco   Allergies   Meds   Problems   Med Hx   Surg Hx   Fam Hx       Review of Systems:  No other skin or systemic complaints except as noted in HPI or Assessment and Plan.  Objective  Well appearing patient in no apparent distress; mood and affect are within normal limits.  A focused examination was performed including face. Relevant physical exam findings are noted in the Assessment and Plan.  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 Possibility of some melasma at upper lip.   Chronic condition with duration over one year. Condition is bothersome to patient. Currently flared.  Possible slight fading but still visible.  Hydroquinone switching to Skin Medicinals Hydroquinone 12%/kojic acid/vitamin C cream pea sized amount twice daily to the entire face for up to 1 month. This cannot be used more than 1 more month due to risk of exogenous ochronosis (permanent dark spots). The patient was advised this is not covered by insurance since it is made by a compounding pharmacy. They will receive an email to check out and the medication will be mailed to their home.   If she feels it helps, can consider repeating after she has been off of treatment for at least 3 months.  Patient may also be a candidate for treatment with  laser, recommend Dr. Joslyn Devon Cox.   Recommend daily broad spectrum sunscreen SPF 30+ to sun-exposed areas, reapply every 2 hours as needed. Call for new or changing lesions.  Staying in the shade or wearing long sleeves, sun glasses (UVA+UVB protection) and wide brim hats (4-inch brim around the entire circumference of the hat) are also recommended for sun protection.    Related Medications triamcinolone (KENALOG) 0.025 % cream Apply 1 application topically 2 (two) times daily. For up to 1 week as needed for hive break outs.  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 Advised patient she can call [place] directly, referral was sent and confirmed. Otherwise we can send referral to a different allergist.   Recommend taking Benadryl, cetirizine and Allegra as well as TMC topically at first onset of a new flare to try and help prevent additional PHI. Caution drowsiness with Benadryl.     Return if symptoms worsen or fail to improve.  [name], RMA, am acting as scribe for Forest Gleason, MD .  Documentation: I have reviewed the above documentation for accuracy and completeness, and I agree with the above.  Forest Gleason, MD

## 2021-01-14 NOTE — Patient Instructions (Addendum)
Consider dermablend makeup for coverage  Will prescribe Skin Medicinals Hydroquinone 12%/kojic acid/vitamin C cream pea sized amount twice daily to the entire face for up to 1 more month. This cannot be used more than 1 more month due to risk of exogenous ochronosis (permanent dark spots). The patient was advised this is not covered by insurance since it is made by a compounding pharmacy. They will receive an email to check out and the medication will be mailed to their home.   Recommend taking Benadryl, cetirizine and Allegra as well as triamcinolone topically with flares. Caution drowsiness with Benadryl.   Topical steroids (such as triamcinolone, fluocinolone, fluocinonide, mometasone, clobetasol, halobetasol, betamethasone, hydrocortisone) can cause thinning and lightening of the skin if they are used for too long in the same area. Your physician has selected the right strength medicine for your problem and area affected on the body. Please use your medication only as directed by your physician to prevent side effects.    If You Need Anything After Your Visit  If you have any questions or concerns for your doctor, please call our main line at [phone] and press option 4 to reach your doctor's medical assistant. If no one answers, please leave a voicemail as directed and we will return your call as soon as possible. Messages left after 4 pm will be answered the following business day.   You may also send [place] a message via [place]. We typically respond to MyChart messages within 1-2 business days.  For prescription refills, please ask your pharmacy to contact our office. Our fax number is [phone].  If you have an urgent issue when the clinic is closed that cannot wait until the next business day, you can page your doctor at the number below.    Please note that while we do our best to be available for urgent issues outside of office hours, we are not available 24/7.   If you have an  urgent issue and are unable to reach [place], you may choose to seek medical care at your doctor's office, retail clinic, urgent care center, or emergency room.  If you have a medical emergency, please immediately call 911 or go to the emergency department.  Pager Numbers  - Dr. Nehemiah Massed: [phone]  - Dr. Laurence Ferrari: [phone]  - Dr. Nicole Kindred: [phone]  In the event of inclement weather, please call our main line at [phone] for an update on the status of any delays or closures.  Dermatology Medication Tips: Please keep the boxes that topical medications come in in order to help keep track of the instructions about where and how to use these. Pharmacies typically print the medication instructions only on the boxes and not directly on the medication tubes.   If your medication is too expensive, please contact our office at [phone] option 4 or send [place] a message through [place].   We are unable to tell what your co-pay for medications will be in advance as this is different depending on your insurance coverage. However, we may be able to find a substitute medication at lower cost or fill out paperwork to get insurance to cover a needed medication.   If a prior authorization is required to get your medication covered by your insurance company, please allow [place] 1-2 business days to complete this process.  Drug prices often vary depending on where the prescription is filled and some pharmacies may offer cheaper prices.  The website www.goodrx.com contains coupons for medications through different pharmacies. The  prices here do not account for what the cost may be with help from insurance (it may be cheaper with your insurance), but the website can give you the price if you did not use any insurance.  - You can print the associated coupon and take it with your prescription to the pharmacy.  - You may also stop by our office during regular business hours and pick up a GoodRx coupon card.  -  If you need your prescription sent electronically to a different pharmacy, notify our office through [facility] or by phone at [phone] option 4.     Si Usted Necesita Algo Despus de Su Visita  Tambin puede enviarnos un mensaje a travs de Pharmacist, community. Por lo general respondemos a los mensajes de MyChart en el transcurso de 1 a 2 das hbiles.  Para renovar recetas, por favor pida a su farmacia que se ponga en contacto con nuestra oficina. Harland Dingwall de fax es [place] [phone].  Si tiene un asunto urgente cuando la clnica est cerrada y que no puede esperar hasta el siguiente da hbil, puede llamar/localizar a su doctor(a) al nmero que aparece a continuacin.   Por favor, tenga en cuenta que aunque hacemos todo lo posible para estar disponibles para asuntos urgentes fuera del horario de [place], no estamos disponibles las 24 horas del da, los 7 das de la [place].   Si tiene un problema urgente y no puede comunicarse con nosotros, puede optar por buscar atencin mdica  en el consultorio de su doctor(a), en una clnica privada, en un centro de atencin urgente o en una sala de emergencias.  Si tiene Engineering geologist, por favor llame inmediatamente al 911 o vaya a la sala de emergencias.  Nmeros de bper  - Dr. Nehemiah Massed: [phone]  - Dra. Moye: [phone]  - Dra. Nicole Kindred: [phone]  En caso de inclemencias del [place], por favor llame a Johnsie Kindred principal al [phone] para una actualizacin sobre el [place] de cualquier retraso o cierre.  Consejos para la medicacin en dermatologa: Por favor, guarde las cajas en las que vienen los medicamentos de uso tpico para ayudarle a seguir las instrucciones sobre dnde y cmo usarlos. Las farmacias generalmente imprimen las instrucciones del medicamento slo en las cajas y no directamente en los tubos del [place].   Si su medicamento es muy caro, por favor, pngase en contacto con Zigmund Daniel  llamando al [phone] y presione la opcin 4 o envenos un mensaje a travs de Pharmacist, community.   No podemos decirle cul ser su copago por los medicamentos por adelantado ya que esto es diferente dependiendo de la cobertura de su seguro. Sin embargo, es posible que podamos encontrar un medicamento sustituto a Electrical engineer un formulario para que el seguro cubra el medicamento que se considera necesario.   Si se requiere una autorizacin previa para que su compaa de seguros [place] su medicamento, por favor permtanos de 1 a 2 das hbiles para completar este proceso.  Los precios de los medicamentos varan con frecuencia dependiendo del Environmental consultant de dnde se surte la receta y alguna farmacias pueden ofrecer precios ms baratos.  El sitio web www.goodrx.com tiene cupones para medicamentos de Airline pilot. Los precios aqu no tienen en cuenta lo que podra costar con la ayuda del seguro (puede ser ms barato con su seguro), pero el sitio web puede darle el precio si no utiliz Research scientist (physical sciences).  - Puede imprimir el cupn correspondiente y llevarlo con su receta a  la farmacia.  - Tambin puede pasar por nuestra oficina durante el horario de atencin regular y Charity fundraiser una tarjeta de cupones de GoodRx.  - Si necesita que su receta se enve electrnicamente a una farmacia diferente, informe a nuestra oficina a travs de MyChart de [place] o por telfono llamando al [phone] y presione la opcin 4.

## 2021-01-16 ENCOUNTER — Ambulatory Visit: Payer: BC Managed Care – PPO | Admitting: Obstetrics and Gynecology

## 2021-01-20 ENCOUNTER — Telehealth: Payer: Self-pay

## 2021-01-20 NOTE — Telephone Encounter (Signed)
Message from [place] to me this morning -  "I called Kellie Davis regarding her referral. She is still not scheduled. They stated they have called her with no response. Wanted to update you"

## 2021-02-04 ENCOUNTER — Encounter: Payer: Self-pay | Admitting: Obstetrics and Gynecology

## 2021-02-04 ENCOUNTER — Other Ambulatory Visit: Payer: Self-pay

## 2021-02-04 ENCOUNTER — Ambulatory Visit (INDEPENDENT_AMBULATORY_CARE_PROVIDER_SITE_OTHER): Payer: BC Managed Care – PPO | Admitting: Obstetrics and Gynecology

## 2021-02-04 VITALS — Ht 64.0 in | Wt 209.0 lb

## 2021-02-04 DIAGNOSIS — E669 Obesity, unspecified: Secondary | ICD-10-CM

## 2021-02-04 DIAGNOSIS — Z6835 Body mass index (BMI) 35.0-35.9, adult: Secondary | ICD-10-CM | POA: Diagnosis not present

## 2021-02-04 MED ORDER — DIETHYLPROPION HCL ER 75 MG PO TB24: 1.0000 | ORAL_TABLET | Freq: Every day | ORAL | 0 refills | Status: AC

## 2021-02-04 NOTE — Progress Notes (Signed)
I connected with Kellie Davis on 02/04/21 at  8:55 AM EST by telephone and verified that I am speaking with the correct person using two identifiers.   I discussed the limitations, risks, security and privacy concerns of performing an evaluation and management service by telephone and the availability of in person appointments. I also discussed with the patient that there may be a patient responsible charge related to this service. The patient expressed understanding and agreed to proceed.  The patient was at home I spoke with the patient from my workstation phone The names of people involved in this encounter were: Kellie Davis , and Kellie Davis   Gynecology Office Visit  Chief Complaint:  Chief Complaint  Patient presents with   Follow-up    Phone visit - weight loss, no concerns.    History of Present Illness: Patientis a 36 y.o. [id number] female, who presents for the evaluation of the desire to lose weight. She has lost 1 pounds 1 months. The patient states the following symptoms since starting her weight loss therapy: appetite suppression, energy, and weight loss.  The patient also reports no other ill effects. The patient specifically denies heart palpitations, anxiety, and insomnia.    Review of Systems: 10 point review of systems negative unless otherwise noted in HPI  Past Medical History:  Past Medical History:  Diagnosis Date   Anxiety disorder, unspecified    Situational   Hypothyroidism    PONV (postoperative nausea and vomiting)    BP drops really low after surgery    Past Surgical History:  Past Surgical History:  Procedure Laterality Date   AUGMENTATION MAMMAPLASTY Bilateral 2013   known rupture left, to be replaced 04/25/2019   BREAST ENHANCEMENT SURGERY  2014   CESAREAN SECTION N/A 06/01/2016   Procedure: PRIMARY LOW TRANSVERSE CESAREAN SECTION;  Surgeon: Kellie Mood, MD;  Location: [facility] ORS;  Service: Obstetrics;  Laterality: N/A;    CESAREAN SECTION N/A 01/16/2018   Procedure: CESAREAN SECTION;  Surgeon: Kellie Mood, MD;  Location: [facility] ORS;  Service: Obstetrics;  Laterality: N/A;   COLPOSCOPY  02/25/2012   HYSTEROSCOPY WITH D & C N/A 11/18/2020   Procedure: DILATATION AND CURETTAGE /HYSTEROSCOPY;  Surgeon: Kellie Mood, MD;  Location: [facility] ORS;  Service: Gynecology;  Laterality: N/A;   INTRAUTERINE DEVICE (IUD) INSERTION N/A 11/18/2020   Procedure: INTRAUTERINE DEVICE (IUD) INSERTION, MIRENA;  Surgeon: Kellie Mood, MD;  Location: [facility] OR[facility];  Service: Gynecology;  Laterality: N/A;   LAPAROSCOPIC SALPINGO OOPHERECTOMY Right 01/06/2015   Procedure: LAPAROSCOPIC right SALPINGECTOMYwith removal of ectopic and partial  right corneal resection ;  no oophorectomySurgeon: Kellie Mood, MD;  Location: [facility];  Service: Gynecology;  Laterality: Right;   TONSILLECTOMY  2014    Gynecologic History: No LMP recorded. (Menstrual status: IUD).  Obstetric History: [id number]  Family History:  Family History  Problem Relation Age of Onset   Heart disease Mother    Diabetes Mother    Hyperlipidemia Mother    Breast cancer Mother 54    Social History:  Social History   Socioeconomic History   Marital status: Married    Spouse name: Not on file   Number of children: Not on file   Years of education: Not on file   Highest education level: Not on file  Occupational History   Not on file  Tobacco Use   Smoking status: Never   Smokeless tobacco: Never  Vaping Use   Vaping Use: Never used  Substance and  Sexual Activity   Alcohol use: No    Comment: occas   Drug use: No   Sexual activity: Yes    Birth control/protection: I.U.D.  Other Topics Concern   Not on file  Social History Narrative   Not on file   Social Determinants of Health   Financial Resource Strain: Not on file  Food Insecurity: Not on file  Transportation Needs: Not on file  Physical Activity: Not on file  Stress: Not on file   Social Connections: Not on file  Intimate Partner Violence: Not on file    Allergies:  Allergies  Allergen Reactions   Codeine Anaphylaxis   Liraglutide (Weight Management) Nausea And Vomiting    Presumed pancreatitis nausea vomiting abdominal pain    Medications: Prior to Admission medications   Medication Sig Start Date End Date Taking? Authorizing Provider  buPROPion (WELLBUTRIN XL) 300 MG 24 hr tablet TAKE 1 TABLET(300 MG) BY MOUTH DAILY 10/15/20  Yes Kellie Mood, MD  phentermine (ADIPEX-P) 37.5 MG tablet Take 1 tablet (37.5 mg total) by mouth daily before breakfast. 01/07/21  Yes Kellie Mood, MD  ibuprofen (ADVIL) 600 MG tablet Take 1 tablet (600 mg total) by mouth every 6 (six) hours as needed for cramping or mild pain. 11/18/20   Kellie Mood, MD    Physical Exam Height 5\' 4"  (1.626 m), weight 209 lb (94.8 kg). Wt Readings from Last 3 Encounters:  02/04/21 209 lb (94.8 kg)  01/07/21 210 lb (95.3 kg)  11/25/20 203 lb (92.1 kg)  Body mass index is 35.87 kg/m.   No physical exam as this was a remote telephone visit to promote social distancing during the current COVID-19 Pandemic   Assessment: 36 y.o. [id number] medication follow up weight loss  Plan: Problem List Items Addressed This Visit   None Visit Diagnoses     Class 2 obesity without serious comorbidity with body mass index (BMI) of 35.0 to 35.9 in adult, unspecified obesity type    -  Primary   Relevant Medications   Diethylpropion HCl CR 75 MG TB24       1) 1500 Calorie ADA Diet  2) Patient education given regarding appropriate lifestyle changes for weight loss including: regular physical activity, healthy coping strategies, caloric restriction and healthy eating patterns.  3) Patient will be started on weight loss medication. The risks and benefits and side effects of medication, such as Adipex (Phenteramine) ,  Tenuate (Diethylproprion), Belviq (lorcarsin), Contrave  (buproprion/naltrexone), Qsymia (phentermine/topiramate), and Saxenda (liraglutide) is discussed. The pros and cons of suppressing appetite and boosting metabolism is discussed. Risks of tolerence and addiction is discussed for selected agents discussed. Use of medicine will ne short term, such as 3-4 months at a time followed by a period of time off of the medicine to avoid these risks and side effects for Adipex, Qsymia, and Tenuate discussed. Pt to call with any negative side effects and agrees to keep follow up appts. - switch to Tenuate as decreased weight loss on phentermine - currently on Wellbutrin XL so not Contrave candidate - previous intolerance to Saxenda secondary to nausea.  Discussed GLP2 alternative including Mounjaro and Wygovi may have a more favorable side-effect profile but both currently experiencing manufacturing bottlenecks  4) Patient to take medication, with the benefits of appetite suppression and metabolism boost d/w pt, along with the side effects and risk factors of long term use that will be avoided with our use of short bursts of therapy. Rx provided.    5)  Telephone time spent with the patient on this visit 7:31min  6) Return in about 4 weeks (around 03/04/2021) for Medication follow up [place].    Kellie Mood, MD, [facility], [facility] 02/04/2021, 9:24 AM

## 2021-02-05 ENCOUNTER — Telehealth: Payer: Self-pay

## 2021-02-05 NOTE — Telephone Encounter (Signed)
I left pt vm to call [facility] at [phone] to schedule appt for Hives.  I spoke to [facility] and they had tried to call pt once with no answer.  So they advised pt needed to call them to get scheduled./sh

## 2021-02-12 ENCOUNTER — Encounter: Payer: Self-pay | Admitting: Obstetrics and Gynecology

## 2021-02-13 ENCOUNTER — Other Ambulatory Visit: Payer: Self-pay | Admitting: Obstetrics and Gynecology

## 2021-02-13 MED ORDER — BUPROPION HCL ER (XL) 300 MG PO TB24: 300.0000 mg | ORAL_TABLET | Freq: Every day | ORAL | 3 refills | Status: AC

## 2021-04-13 ENCOUNTER — Telehealth: Payer: Self-pay

## 2021-04-13 ENCOUNTER — Other Ambulatory Visit: Payer: Self-pay | Admitting: Obstetrics & Gynecology

## 2021-04-13 DIAGNOSIS — Z1239 Encounter for other screening for malignant neoplasm of breast: Secondary | ICD-10-CM

## 2021-04-13 NOTE — Telephone Encounter (Signed)
Done

## 2021-04-13 NOTE — Telephone Encounter (Signed)
Pt calling for an order to be put in for [facility] for her annual mammogram.  [phone] ?

## 2021-04-13 NOTE — Telephone Encounter (Signed)
Pt aware; also tx'd to schedulers - wants macrobid. ?

## 2021-04-20 ENCOUNTER — Other Ambulatory Visit: Payer: Self-pay | Admitting: Obstetrics & Gynecology

## 2021-04-20 DIAGNOSIS — N6314 Unspecified lump in the right breast, lower inner quadrant: Secondary | ICD-10-CM

## 2021-05-19 ENCOUNTER — Ambulatory Visit
Admission: RE | Admit: 2021-05-19 | Discharge: 2021-05-19 | Disposition: A | Discharge status: Home / Self Care | Payer: BC Managed Care – PPO | Source: Ambulatory Visit | Attending: Obstetrics & Gynecology | Admitting: Obstetrics & Gynecology

## 2021-05-19 DIAGNOSIS — N6314 Unspecified lump in the right breast, lower inner quadrant: Secondary | ICD-10-CM | POA: Diagnosis not present

## 2021-05-19 DIAGNOSIS — N6315 Unspecified lump in the right breast, overlapping quadrants: Secondary | ICD-10-CM | POA: Diagnosis not present

## 2021-07-01 DIAGNOSIS — N939 Abnormal uterine and vaginal bleeding, unspecified: Secondary | ICD-10-CM | POA: Diagnosis not present

## 2021-07-01 DIAGNOSIS — E669 Obesity, unspecified: Secondary | ICD-10-CM | POA: Diagnosis not present

## 2021-07-01 DIAGNOSIS — Z6838 Body mass index (BMI) 38.0-38.9, adult: Secondary | ICD-10-CM | POA: Diagnosis not present

## 2022-01-07 IMAGING — MG DIGITAL SCREENING BREAST BILAT IMPLANT W/ TOMO W/ CAD
6 of 10 series · 6 of 26 positions shown · non-contrast
Comparison: None.

CLINICAL DATA: Screening. Known ruptured left breast implant.

EXAM:
DIGITAL SCREENING BILATERAL MAMMOGRAM WITH IMPLANTS, CAD AND TOMO
The patient has retropectoral saline implants. Standard and implant
displaced views were performed.

[R MLO]
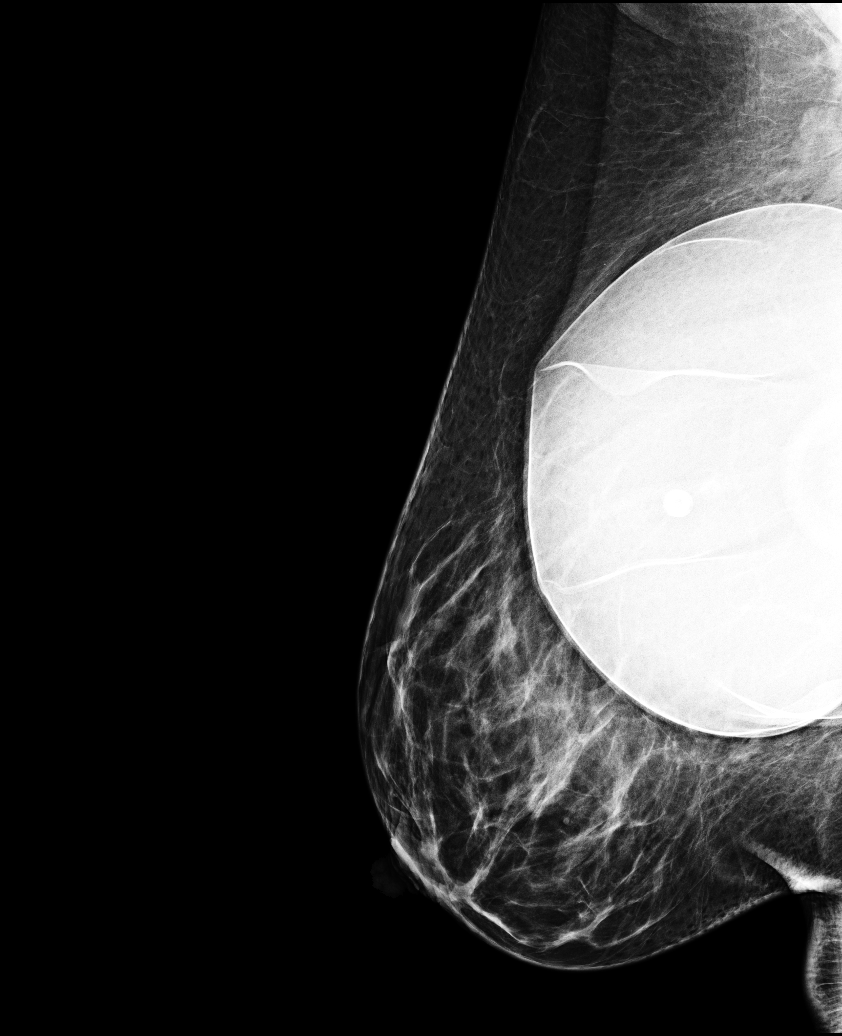

[R CC]
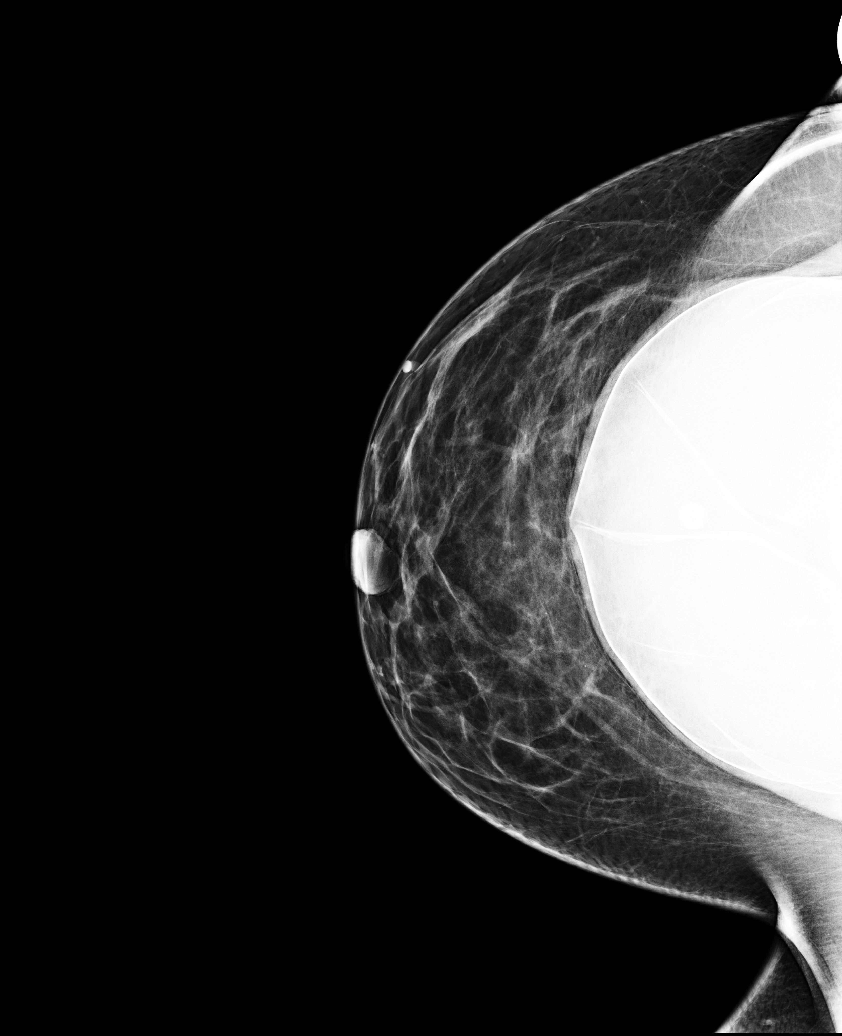

[L MLO synth-2D]
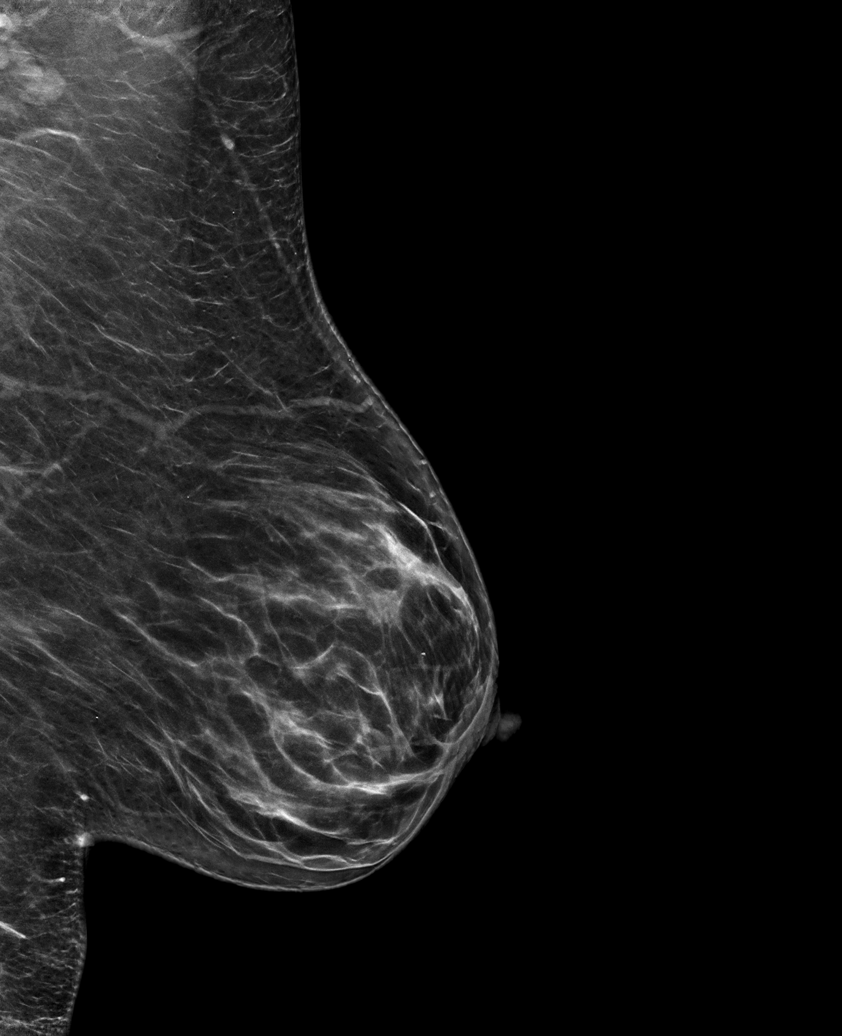

[R MLO synth-2D]
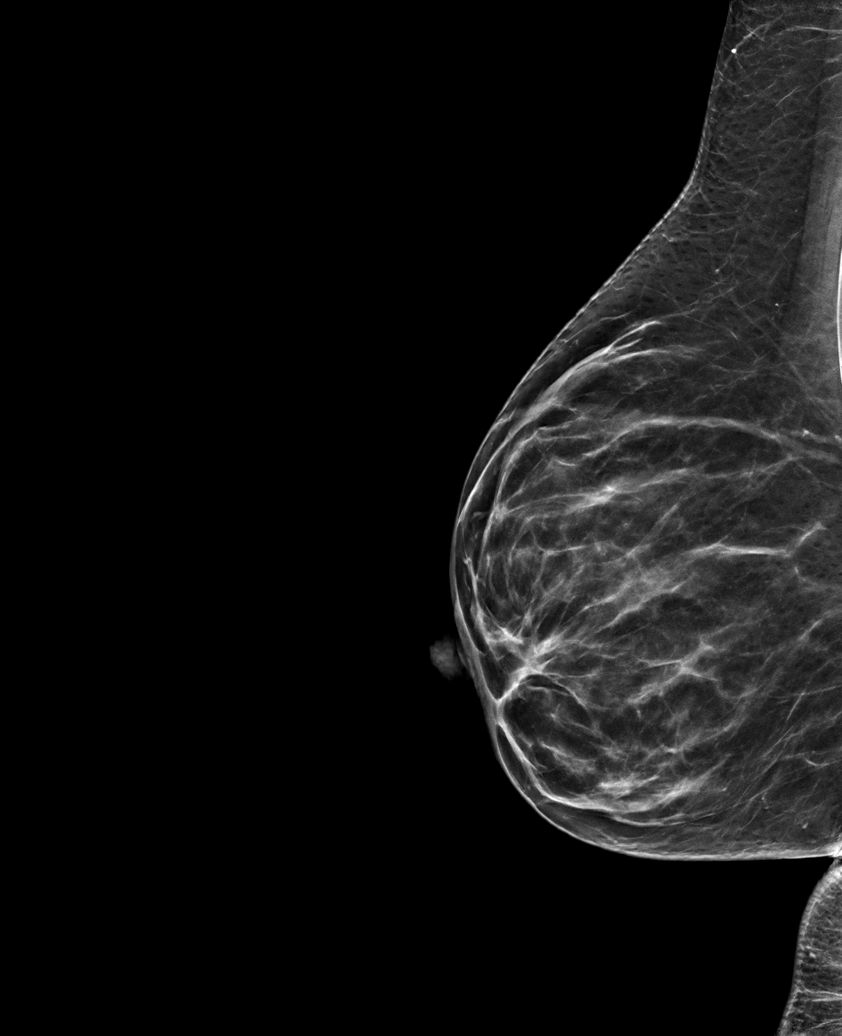

[R CC synth-2D]
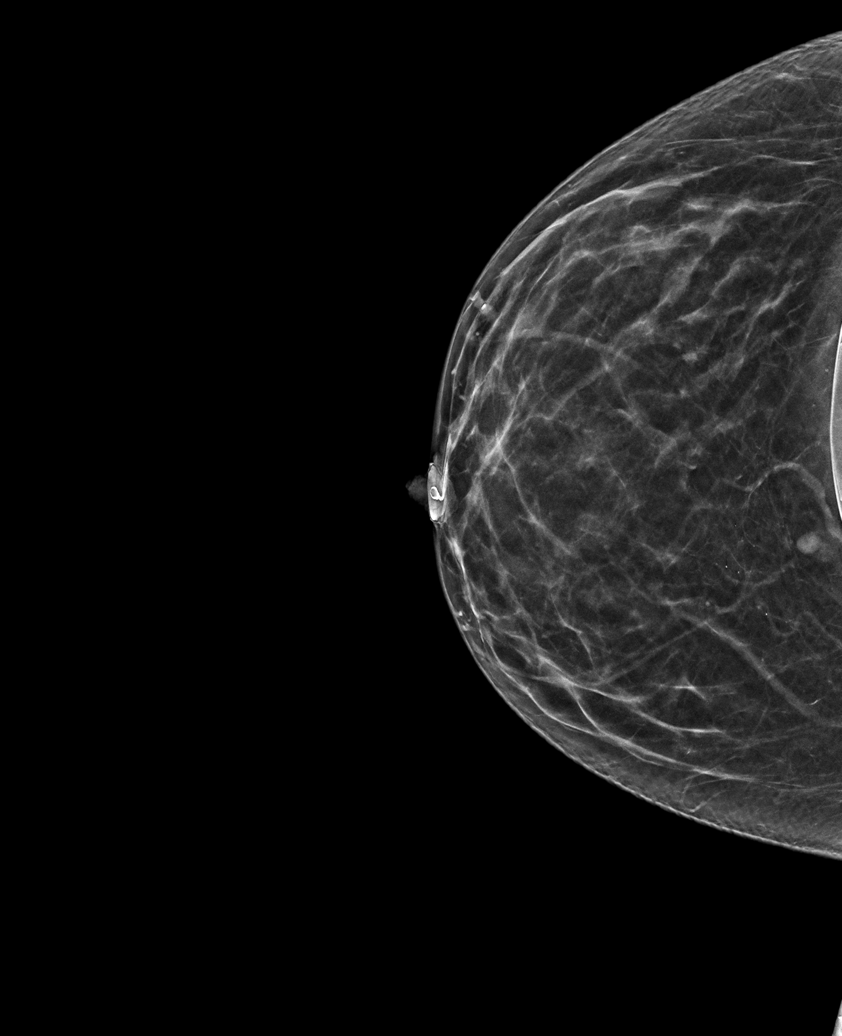

[L CC synth-2D]
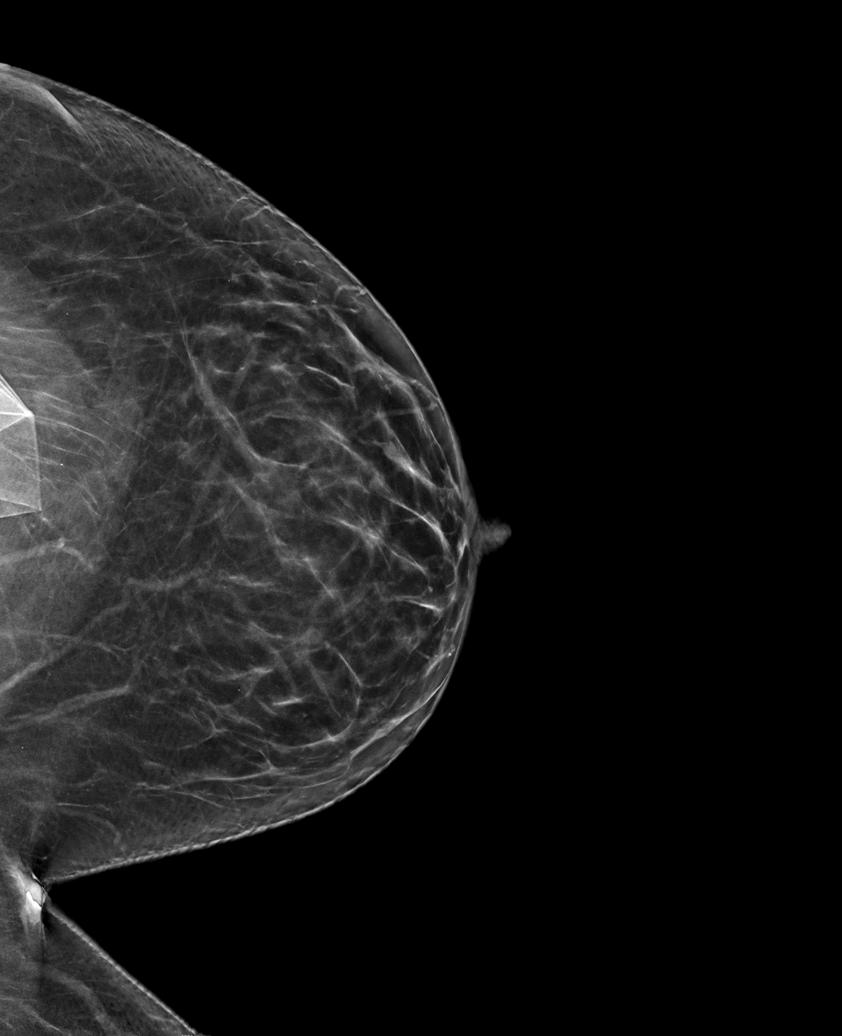

[6 of 26 positions shown; findings below may reference images not displayed]

ACR Breast Density Category b: There are scattered areas of
fibroglandular density.
FINDINGS: In the right breast, a possible mass warrants further evaluation. In
the left breast, no findings suspicious for malignancy. The left
breast implant is ruptured. Images were processed with CAD.
IMPRESSION: Further evaluation is suggested for possible mass in the right
breast.

RECOMMENDATION:
Diagnostic mammogram and possibly ultrasound of the right breast.
(Code:[id number])

The patient will be contacted regarding the findings, and additional
imaging will be scheduled.

BI-RADS CATEGORY  0: Incomplete. Need additional imaging evaluation
and/or prior mammograms for comparison.

## 2022-06-18 ENCOUNTER — Telehealth: Payer: Self-pay

## 2022-06-18 NOTE — Telephone Encounter (Signed)
Notified pharmacy Dr. Bonney Aid no longer with our practice. Patient is still under his care. New practice information given [facility]. They will change address and send to his new location.

## 2023-01-19 ENCOUNTER — Other Ambulatory Visit: Payer: Self-pay | Admitting: Surgery

## 2023-01-19 DIAGNOSIS — Z01818 Encounter for other preprocedural examination: Secondary | ICD-10-CM

## 2023-01-21 ENCOUNTER — Ambulatory Visit
Admission: RE | Admit: 2023-01-21 | Discharge: 2023-01-21 | Disposition: A | Discharge status: Home / Self Care | Payer: 59 | Source: Ambulatory Visit | Attending: Surgery | Admitting: Surgery

## 2023-01-21 ENCOUNTER — Other Ambulatory Visit: Payer: Self-pay | Admitting: Surgery

## 2023-01-21 DIAGNOSIS — Z01818 Encounter for other preprocedural examination: Secondary | ICD-10-CM

## 2023-01-21 DIAGNOSIS — R9431 Abnormal electrocardiogram [ECG] [EKG]: Secondary | ICD-10-CM | POA: Diagnosis not present

## 2023-01-21 DIAGNOSIS — Z01810 Encounter for preprocedural cardiovascular examination: Secondary | ICD-10-CM | POA: Diagnosis not present

## 2023-02-03 ENCOUNTER — Ambulatory Visit: Payer: BC Managed Care – PPO | Admitting: Dietician

## 2023-02-10 ENCOUNTER — Encounter: Payer: 59 | Attending: Surgery | Admitting: Dietician

## 2023-02-10 VITALS — Ht 63.0 in | Wt 222.3 lb

## 2023-02-10 DIAGNOSIS — Z8639 Personal history of other endocrine, nutritional and metabolic disease: Secondary | ICD-10-CM

## 2023-02-10 DIAGNOSIS — E7800 Pure hypercholesterolemia, unspecified: Secondary | ICD-10-CM

## 2023-02-10 DIAGNOSIS — Z713 Dietary counseling and surveillance: Secondary | ICD-10-CM | POA: Insufficient documentation

## 2023-02-10 DIAGNOSIS — Z6839 Body mass index (BMI) 39.0-39.9, adult: Secondary | ICD-10-CM | POA: Insufficient documentation

## 2023-02-10 NOTE — Patient Instructions (Signed)
 Look for suitable chewable bariatric multivitamin for use after surgery Continue with current pattern of protein shakes and 1-2 solid meals daily, low fat and low sugar choices.  Keep up regular exercise Work on eating more slowly, allowing 20-30 minutes to finish

## 2023-02-10 NOTE — Progress Notes (Signed)
 Nutrition Assessment for Bariatric Surgery   Appointment start time: 1200   end time: 1300  Planned Surgery: Sleeve gastrectomy  Anthropometrics: Weight: 222.3lbs Height: 5'3 BMI: 39.38   Patient's Goal Weight: goal is improved health and reduced joint strain rather than a specific weight  Clinical: Medical History: history of low thyroid  function, took levothyroxine for a time but stopped when trying to get pregnant; has not restarted since. Reports strong family history of hypothyroidism  Medications and Supplements: buPROPion , omeprazole, women's multivitamin, Vitamin C Relevant labs: 01/13/23-- TSH 0.858, HbA1C 4.7, vitamin D 25.5, Total cholesterol 180, HDL 62, LDL 104, Triglycerides 73 Notable symptoms: general lack of hunger during the day Drug allergies: codeine; nausea and vomiting and abdominal pain with liraglutide  Food allergies: none  Lifestyle and Dietary History:  Dieting/ weight history:  2020-21 low carb and intermittent fasting with RD visits, took phentermine . Lost weight but unable to maintain that eating pattern. Had tried other diets, periods of taking phentermine  with only short-term success. College athlete Patient states she has never been slim Current busy schedule with 3 school-age children, full time job, and family business  Disordered or emotional eating history: none; typically eats less when stressed  Physical activity: workout with personal trainer 60 minutes 3x a week  Dietary Recall:  Daily pattern: 1 solid meal, 1-2 liquid meals and 2 snacks. Dining out: 0-1 meals per week.  Breakfast: slimfast high protein   Snack: cheese sticks, trail mix Lunch: slimfast except occasional lunch meeting Snack: same as am Supper: cooks chicken/ steak + veg hearty foods (Italian) Snack: none; rarely protein shake Beverages: water, recently stopped coffee, Alani energy drink occasionally   Nutrition Intervention: Instructed patient on pre-op diet goals  and importance of close adherence to bariatric diet after surgery to avoid side effects and complications.  Discussed stages of the bariatric diet after surgery as well as the importance of adequate protein and fluid intake.  Instructed on need for vitamin and calcium  supplementation lifelong after surgery Provided overview of 2-week pre-op diet.   Nutrition Diagnosis: [place]-3.3 Overweight/ obesity related to history of excess calories and underactive thyroid  as evidenced by patient with current BMI of 39.38, following dietary guidelines for weight loss prior to bariatric surgery.  Teaching method(s) utilized: Risk Manager provided: Getting Ready for Bariatric Surgery: An Overview  Learning Readiness: Change in progress  Barriers to learning/ implementing lifestyle change: none  Demonstrated degree of understanding via: Teach Back  Summary: Patient has begun making diet and lifestyle changes in effort to lose weight and prepare for bariatric surgery.  Patient's goal for weight loss is improved joint health and health risk reduction. She has solid support from family and friends.  She agrees to work on slower eating, monitoring food portions  prior to surgery.  She voices understanding of, and is motivated to follow the bariatric diet after surgery.  From a nutrition standpoint, she is ready to proceed with the bariatric surgery program.    Plan: Patient commits to returning for  pre-op class prior to surgery.  She will plan to return for post-op RD visits beginning 2 weeks after surgery.

## 2023-03-10 ENCOUNTER — Encounter: Payer: Self-pay | Admitting: Neurology

## 2023-03-11 ENCOUNTER — Institutional Professional Consult (permissible substitution): Payer: 59 | Admitting: Neurology

## 2023-04-15 ENCOUNTER — Ambulatory Visit: Payer: Self-pay | Admitting: Surgery

## 2023-04-26 ENCOUNTER — Ambulatory Visit (HOSPITAL_COMMUNITY): Payer: Self-pay | Admitting: Licensed Clinical Social Worker

## 2023-04-26 DIAGNOSIS — F4320 Adjustment disorder, unspecified: Secondary | ICD-10-CM | POA: Diagnosis not present

## 2023-04-26 NOTE — Progress Notes (Signed)
 Virtual Visit via Video Note  I connected with Kellie Davis on 04/26/23 at  4:00 PM EDT by a video enabled telemedicine application and verified that I am speaking with the correct person using two identifiers.  Location: Patient: virtual [place] Provider: virtual [place]    I discussed the limitations of evaluation and management by telemedicine and the availability of in person appointments. The patient expressed understanding and agreed to proceed.  Kellie Davis Kellie Ivins, LCSW   Comprehensive Clinical Assessment (CCA) Note  04/26/2023 Kellie Davis [id number]  Chief Complaint:  Chief Complaint  Patient presents with   BARIATRIC SCREENING   Visit Diagnosis:  Encounter Diagnosis  Name Primary?   Adjustment disorder, unspecified type Yes   Disposition:  Clinician sees no significant psychological factors that would hinder the success of bariatric surgery at time of assessment. Clinician supports patient candidacy for Bariatric Surgery.   Patient reports realistic expectations post surgery, is aware of the pre and post surgical process, client reports that behavioral health diagnosis(es) are stable at time of assessment, client reports positive pre and post surgical support system, and client reports motivation to make positive change.    CCA Biopsychosocial Intake/Chief Complaint:  Screening prior to bariatric weight loss surgery  Current Symptoms/Problems: Kellie Davis is a 38 year old female reporting to [facility] behavioral health outpatient for assessment prior to bariatric weight loss surgery.  Kellie Davis reports that currently she is taking bupropion for appetite suppressant/weight loss reasons.  Patient denies any history of depression or anxiety symptoms or treatment other than typical situational experiences.  Patient reports that she has always been athletic and active, and her weight was not an issue until she had 3 children.  Patient reports that she has tried diet, exercise, phentermine,  eating low-carb, eating low sugar, and was recommended to take a GLP-1 but could not because of insurance barriers.  Patient reports that she cannot be as active as when she was younger because of hip and leg pain.  Patient reports that she does work out several times a week and works with a Systems analyst one on 1.  Patient denies any suicidal ideation, homicidal ideation, or any perceptual disturbances.  Patient reports that she will occasionally have 1-2 drinks with friends, but it is very rare.  Patient reports that she could easily give up alcohol 100%.  Patient denies any additional substance use.   Patient Reported Schizophrenia/Schizoaffective Diagnosis in Past: No   Strengths: organized, productive, adaptable, good communicator  Preferences: Patient reports that she enjoys spending time with family, and enjoys outdoor activities  Abilities: Patient is currently an occupational therapist and a real estate agent   Type of Services Patient Feels are Needed: Patient contemplating bariatric weight loss surgery   Initial Clinical Notes/Concerns: Patient denies history of depression, anxiety, or trauma   Mental Health Symptoms Depression:  Sleep (too much or little); Weight gain/loss (stopping phentermine.)   Duration of Depressive symptoms: Greater than two weeks   Mania:  None   Anxiety:   Tension; Restlessness (situational worries; cluster headaches)   Psychosis:  None   Duration of Psychotic symptoms: No data recorded  Trauma:  None   Obsessions:  None   Compulsions:  None   Inattention:  None   Hyperactivity/Impulsivity:  None   Oppositional/Defiant Behaviors:  None   Emotional Irregularity:  None   Other Mood/Personality Symptoms:  Patient denies any additional mood related symptoms    Mental Status Exam Appearance and self-care  Stature:  Average  Weight:  Obese   Clothing:  Neat/clean   Grooming:  Normal   Cosmetic use:  None   Posture/gait:   Normal   Motor activity:  Not Remarkable   Sensorium  Attention:  Normal   Concentration:  Normal   Orientation:  X5   Recall/memory:  Normal   Affect and Mood  Affect:  Full Range   Mood:  Other (Comment) (Within normal limits)   Relating  Eye contact:  Normal   Facial expression:  Responsive   Attitude toward examiner:  Cooperative   Thought and Language  Speech flow: Clear and Coherent   Thought content:  Appropriate to Mood and Circumstances   Preoccupation:  None   Hallucinations:  None   Organization:  No data recorded  INTACT; GOAL DIRECTED   Company secretary of Knowledge:  Good   Intelligence:  Above Average   Abstraction:  Normal   Judgement:  Good   Reality Testing:  Realistic   Insight:  Good   Decision Making:  Normal   Social Functioning  Social Maturity:  Responsible   Social Judgement:  Normal   Stress  Stressors:  Other (Comment) (life is busy at home--work/life balance)   Coping Ability:  Normal; Resilient   Skill Deficits:  No data recorded none  Supports:  Friends/Service system; Family; Church     Religion: Religion/Spirituality Are You A Religious Person?: Yes How Might This Affect Treatment?: Bible study, goes to church every week.  Leisure/Recreation: Leisure / Recreation Do You Have Hobbies?: Yes Leisure and Hobbies: pickleball on sundays, interior design, beach, dogs, yardball, travel with kids  Exercise/Diet: Exercise/Diet Do You Exercise?: Yes What Type of Exercise Do You Do?: Weight Training How Many Times a Week Do You Exercise?: 4-5 times a week Have You Gained or Lost A Significant Amount of Weight in the Past Six Months?: Yes-Gained Number of Pounds Gained: 8 Do You Follow a Special Diet?: Yes Type of Diet: drinking slim fast 2 a day and have snacks in the mornings and eat a dinner with family. 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?: No  Education: Education Is Patient Currently Attending School?: No Last Grade Completed: 12 Name of High School: [employer] Did Garment/textile technologist From [employer]?: Yes Did You Attend College?: Yes What Type of College Degree Do you Have?: [employer] and [employer] and [employer] for real estate license Did [place] Attend Graduate School?: Yes What is Your Geophysicist/field seismologist Degree?: Master degree What Was Your Major?: Occupational therapy Did You Have Any Special Interests In School?: Patient also has a degree in real estate and is a licensed real estate agent in 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 Did You Have An Individualized Education Program (IIEP): No Did You Have Any Difficulty At School?: No Patient's Education Has Been Impacted by Current Illness: No   CCA Family/Childhood History Family and Relationship History: Family history Marital status: Married Number of Years Married: 11 What types of issues is patient dealing with in the relationship?: Patient denies any current issues in relationship Additional relationship information: Patient reports that her husband  has a very strong support system and that she is very happy Are you sexually active?: Yes What is your sexual orientation?: Heterosexual Does patient have children?: Yes How many children?: 3 How is patient's relationship with their children?: Patient reports a very positive relationship with children  Childhood History:  Childhood History By whom was/is the patient raised?: Both parents Additional childhood history information: parents married 40 years Description of patient's relationship with caregiver when they were a child: Patient reports very stable, loving relationship with both parents as a child Patient's description of current relationship with people who raised him/her: Patient reports very stable, loving relationship with both parents currently How were you disciplined when you got in trouble as a child/adolescent?: Fairly Does patient have siblings?: Yes Number of Siblings: 1 Description of patient's current relationship with siblings: excellent relationship--current business partner with brother Did patient suffer any verbal/emotional/physical/sexual abuse as a child?: No Did patient suffer from severe childhood neglect?: No Has patient ever been sexually abused/assaulted/raped as an adolescent or adult?: No Was the patient ever a victim of a crime or a disaster?: No Witnessed domestic violence?: No Has patient been affected by domestic violence as an adult?: No  Child/Adolescent Assessment:     CCA Substance Use Alcohol/Drug Use: Alcohol / Drug Use Pain Medications: SEE MAR Prescriptions: SEE MAR Over the Counter: SEE MAR History of alcohol / drug use?: No history of alcohol / drug abuse Longest period of sobriety (when/how long): 2+ years Negative Consequences of Use:  (None) Withdrawal Symptoms: None     ASAM's:  Six Dimensions of Multidimensional Assessment  Dimension 1:  Acute Intoxication and/or Withdrawal Potential:   Dimension 1:  Description of  individual's past and current experiences of substance use and withdrawal: Social drinker.  Dimension 2:  Biomedical Conditions and Complications:   Dimension 2:  Description of patient's biomedical conditions and  complications: Mild chronic pain  Dimension 3:  Emotional, Behavioral, or Cognitive Conditions and Complications:  Dimension 3:  Description of emotional, behavioral, or cognitive conditions and complications: None  Dimension 4:  Readiness to Change:  Dimension 4:  Description of Readiness to Change criteria: Patient reports that she is willing to make positive changes  Dimension 5:  Relapse, Continued use, or Continued Problem Potential:  Dimension 5:  Relapse, continued use, or continued problem potential critiera description: Patient has good support system  Dimension 6:  Recovery/Living Environment:  Dimension 6:  Recovery/Iiving environment criteria description: Patient has good support system  ASAM Severity Score: ASAM's Severity Rating Score: 0  ASAM Recommended Level of Treatment: ASAM Recommended Level of Treatment: Level I Outpatient Treatment   Substance use Disorder (SUD) Substance Use Disorder (SUD)  Checklist Symptoms of Substance Use:  (None)  Recommendations for Services/Supports/Treatments: Recommendations for Services/Supports/Treatments Recommendations For Services/Supports/Treatments: Individual Therapy  DSM5 Diagnoses: Patient Active Problem List   Diagnosis Date Noted   Endometrial polyp    Abnormal uterine bleeding (AUB)    Uterine scar from previous cesarean delivery affecting pregnancy 01/16/2018   Avitaminosis D 04/29/2016  Hypercholesterolemia without hypertriglyceridemia 04/29/2016   History of hypothyroidism 03/30/2016    Patient Centered Plan: Patient is on the following Treatment Plan(s):    Behavioral Health Assessment  Patient Name Kellie Davis Date of Birth:  November 08, 1985 Age:  38 y.o. Date of Interview:  04/26/23 Gender: F   Date  of Report : 04/26/23 Purpose:   Bariatric/Weight-loss Surgery (pre-operative evaluation)    Assessment Instruments:  DSM-5-TR Self-Rated Level 1 Cross-Cutting Symptom Measure--Adult Severity Measure for Generalized Anxiety Disorder--Adult EAT-26  Chief Complaint: Obesity  Client Background: Patient is a 38 year old female seeking weight loss surgery. 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  Patients marital status is patiently is currently married with 3 children.   The patient is 5 feet 2 inches tall and 230 lbs., reflecting a BMI of 42.1 classifying patient in the obese range and at further risk of co-morbid diseases.  Weight History: Patient reports that she has tried using medication to lose weight, changing diet, increasing exercise, eating low-carb.  Patient reports that she was athletic in the past and gained weight after having 3 children.  Patient reports that having some chronic pain on the right side limits physical activity at times.  Eating Patterns: Patient reports that she currently has a SlimFast shake in the morning and at lunchtime and has snacks such as nuts and cheese sticks throughout the day.  Patient reports that she eats a normal portion of dinner with her family.  Related Medical Issues: Patient denies any significant medical issues at time of assessment.  Patient reports chronic pain at times and hip and leg.  Family History of Obesity: Patient denies  Tobacco Use: Patient denies tobacco use.   PATIENT BEHAVIORAL ASSESSMENT SCORES  Personal History of Mental Illness: Patient denies treatment for depression and anxiety.   Mental Status Examination: Patient was oriented x5 (person, place, situation, time, and object). Patient was appropriately groomed, and neatly dressed. Patient was alert, engaged, pleasant, and cooperative. Patient denies suicidal and homicidal  ideations or any perceptual disturbances. Patient denies self-injury.   DSM-5-TR Self-Rated Level 1 Cross-Cutting Symptom Measure--Adult: 1 (feel anxious, worried, or nervous). Pt reports this is situational  Severity Measure for Generalized Anxiety Disorder--Adult: Patient completed a 10-question scale. Total scores can range from 0 to 40. A raw score is calculated by summing the answer to each question, and an average total score is achieved by dividing the raw score by the number of items (e.g., 10). Patient had a total raw score of 1 out of 40 which was divided by the total number of questions answered (10) to get an average score of 1 which indicates no significant anxiety.   EAT-26: The EAT-26 is a twenty-six-question screening tool to identify symptoms of eating disorders and disordered eating. The patient scored 13 out of 26. Scores below a 20 are considered not meeting criteria for disordered eating. Patient denies inducing vomiting, or intentional meal skipping. Patient denies binge eating behaviors. Patient denies laxative abuse. Patient does not meet criteria for a DSM-V eating disorder.  Conclusion & Recommendations:   Health history and current assessment reflect that patient is suitable to be a candidate for bariatric surgery. Patient understands the procedure, the risks associated with it, and the importance of post-operative holistic care (Physical, Spiritual/Values, Relationships, and Mental/Emotional health) with access to resources for support as needed. The patient has made an informed decision to proceed with procedure. The  patient is motivated and expressed understanding of the post-surgical requirements. Patient's psychological assessment will be valid from today's date for 6 months (10/27/2023). After that date, a follow-up appointment will be needed to re-evaluate the patient's psychological status.   Clinician sees no significant psychological factors that would hinder the success  of bariatric surgery at time of assessment. Clinician supports patient candidacy for Bariatric Surgery.   Rozanna Box, MSW, LCSW Licensed Clinical Social [facility]     Referrals to Alternative Service(s): Referred to Alternative Service(s):   Place:   Date:   Time:    Referred to Alternative Service(s):   Place:   Date:   Time:    Referred to Alternative Service(s):   Place:   Date:   Time:    Referred to Alternative Service(s):   Place:   Date:   Time:      Collaboration of Care: Other patient to continue care and recommendations of the bariatric team members  Patient/Guardian was advised Release of Information must be obtained prior to any record release in order to collaborate their care with an outside provider. Patient/Guardian was advised if they have not already done so to contact the registration department to sign all necessary forms in order for [place] to release information regarding their care.   Consent: Patient/Guardian gives verbal consent for treatment and assignment of benefits for services provided during this visit. Patient/Guardian expressed understanding and agreed to proceed.   Chanteria Haggard R Floride Hutmacher, LCSW

## 2023-06-13 ENCOUNTER — Ambulatory Visit: Payer: 59 | Admitting: Nurse Practitioner

## 2023-11-30 ENCOUNTER — Ambulatory Visit
Admission: RE | Admit: 2023-11-30 | Discharge: 2023-11-30 | Disposition: A | Discharge status: Home / Self Care | Source: Ambulatory Visit | Attending: Emergency Medicine | Admitting: Emergency Medicine

## 2023-11-30 VITALS — BP 103/69 | HR 91 | Temp 98.2°F | Resp 20

## 2023-11-30 DIAGNOSIS — K529 Noninfective gastroenteritis and colitis, unspecified: Secondary | ICD-10-CM | POA: Diagnosis not present

## 2023-11-30 MED ORDER — ONDANSETRON 4 MG PO TBDP: 4.0000 mg | ORAL_TABLET | Freq: Three times a day (TID) | ORAL | 0 refills | Status: AC | PRN

## 2023-11-30 MED ORDER — DIPHENOXYLATE-ATROPINE 2.5-0.025 MG PO TABS: 1.0000 | ORAL_TABLET | Freq: Four times a day (QID) | ORAL | 0 refills | Status: AC | PRN

## 2023-11-30 MED ORDER — AZITHROMYCIN 250 MG PO TABS: 250.0000 mg | ORAL_TABLET | Freq: Every day | ORAL | 0 refills | Status: AC

## 2023-11-30 NOTE — ED Triage Notes (Signed)
 Patient reports that she was in [place] last week. Patient states she was with 10 people and 6 out of 10 people have GI symptoms. Patient complains of abdominal pain with N/V/D that started on 11/23/23. Patient has taken imodium, nauzene, and drink Gatorade. Rates pain 5/10.

## 2023-11-30 NOTE — ED Provider Notes (Signed)
 CAY RALPH PELT    CSN: [id number] Arrival date & time: 11/30/23  1207      History   Chief Complaint Chief Complaint  Patient presents with   Abdominal Pain    Entered by patient   Nausea   Emesis   Diarrhea    HPI Kellie Davis is a 38 y.o. female.   Patient presents for evaluation of centralized abdominal pain, nausea vomiting and diarrhea beginning 7 days ago after returning from trip to [place].  6 out of 10 travelers are sick with same symptoms.  Abdominal pain is constant described as a contracting that typically elicits diarrhea which is described as watery.  Last occurrence of vomiting and diarrhea within the hour.  Has attempted use of nauseating, Imodium and increase fluids with electrolytes without improvement.  Denies fever or URI symptoms.  Past Medical History:  Diagnosis Date   Anxiety disorder, unspecified    Situational   Hypothyroidism    PONV (postoperative nausea and vomiting)    BP drops really low after surgery    Patient Active Problem List   Diagnosis Date Noted   Endometrial polyp    Abnormal uterine bleeding (AUB)    Uterine scar from previous cesarean delivery affecting pregnancy 01/16/2018   Avitaminosis D 04/29/2016   Hypercholesterolemia without hypertriglyceridemia 04/29/2016   History of hypothyroidism 03/30/2016    Past Surgical History:  Procedure Laterality Date   AUGMENTATION MAMMAPLASTY Bilateral 2013   known rupture left, to be replaced 04/25/2019   BREAST ENHANCEMENT SURGERY  2014   CESAREAN SECTION N/A 06/01/2016   Procedure: PRIMARY LOW TRANSVERSE CESAREAN SECTION;  Surgeon: Glory High, MD;  Location: [facility];  Service: Obstetrics;  Laterality: N/A;   CESAREAN SECTION N/A 01/16/2018   Procedure: CESAREAN SECTION;  Surgeon: High Glory, MD;  Location: [facility];  Service: Obstetrics;  Laterality: N/A;   COLPOSCOPY  02/25/2012   HYSTEROSCOPY WITH D & C N/A 11/18/2020   Procedure: DILATATION AND CURETTAGE  /HYSTEROSCOPY;  Surgeon: High Glory, MD;  Location: [facility];  Service: Gynecology;  Laterality: N/A;   INTRAUTERINE DEVICE (IUD) INSERTION N/A 11/18/2020   Procedure: INTRAUTERINE DEVICE (IUD) INSERTION, MIRENA ;  Surgeon: High Glory, MD;  Location: [facility];  Service: Gynecology;  Laterality: N/A;   LAPAROSCOPIC SALPINGO OOPHERECTOMY Right 01/06/2015   Procedure: LAPAROSCOPIC right SALPINGECTOMYwith removal of ectopic and partial  right corneal resection ;  no oophorectomySurgeon: Glory High, MD;  Location: [facility];  Service: Gynecology;  Laterality: Right;   TONSILLECTOMY  2014    OB History     Gravida  5   Para  3   Term  3   Preterm      AB  2   Living  3      SAB  1   IAB      Ectopic  1   Multiple  0   Live Births  3            Home Medications    Prior to Admission medications   Medication Sig Start Date End Date Taking? Authorizing Provider  azithromycin (ZITHROMAX) 250 MG tablet Take 1 tablet (250 mg total) by mouth daily. Take first 2 tablets together, then 1 every day until finished. 11/30/23  Yes Kelsei Defino R, NP  diphenoxylate-atropine (LOMOTIL) 2.5-0.025 MG tablet Take 1 tablet by mouth 4 (four) times daily as needed for diarrhea or loose stools. 11/30/23  Yes Linzee Depaul R, NP  ondansetron  (ZOFRAN -ODT) 4 MG disintegrating tablet  Take 1 tablet (4 mg total) by mouth every 8 (eight) hours as needed. 11/30/23  Yes Arshia Rondon R, NP  buPROPion  (WELLBUTRIN  XL) 300 MG 24 hr tablet Take 1 tablet (300 mg total) by mouth daily. 02/13/21   Lake Read, MD  Diethylpropion  HCl CR 75 MG TB24 Take 1 tablet (75 mg total) by mouth daily before breakfast. Patient not taking: Reported on 02/10/2023 02/04/21   Lake Read, MD  ibuprofen  (ADVIL ) 600 MG tablet Take 1 tablet (600 mg total) by mouth every 6 (six) hours as needed for cramping or mild pain. 11/18/20   Lake Read, MD  Multiple Vitamins-Minerals (WOMENS  MULTIVITAMIN PO) Take by mouth.    [provider]  omeprazole (PRILOSEC) 40 MG capsule Take by mouth.    [provider]    Family History Family History  Problem Relation Age of Onset   Heart disease Mother    Diabetes Mother    Hyperlipidemia Mother    Breast cancer Mother 13    Social History Social History   Tobacco Use   Smoking status: Never   Smokeless tobacco: Never  Vaping Use   Vaping status: Never Used  Substance Use Topics   Alcohol use: No    Comment: occas   Drug use: No     Allergies   Codeine and Liraglutide  (weight management)   Review of Systems Review of Systems   Physical Exam Triage Vital Signs ED Triage Vitals  Encounter Vitals Group     BP 11/30/23 1249 103/69     Girls Systolic BP Percentile --      Girls Diastolic BP Percentile --      Boys Systolic BP Percentile --      Boys Diastolic BP Percentile --      Pulse Rate 11/30/23 1249 91     Resp 11/30/23 1249 20     Temp 11/30/23 1249 98.2 F (36.8 C)     Temp Source 11/30/23 1249 Oral     SpO2 11/30/23 1249 98 %     Weight --      Height --      Head Circumference --      Peak Flow --      Pain Score 11/30/23 1247 5     Pain Loc --      Pain Education --      Exclude from Growth Chart --    No data found.  Updated Vital Signs BP 103/69 (BP Location: Left Arm)   Pulse 91   Temp 98.2 F (36.8 C) (Oral)   Resp 20   SpO2 98%   Visual Acuity Right Eye Distance:   Left Eye Distance:   Bilateral Distance:    Right Eye Near:   Left Eye Near:    Bilateral Near:     Physical Exam Constitutional:      Appearance: Normal appearance.  Eyes:     Extraocular Movements: Extraocular movements intact.  Pulmonary:     Effort: Pulmonary effort is normal.  Abdominal:     General: Abdomen is flat. Bowel sounds are increased.     Palpations: Abdomen is soft.     Tenderness: There is abdominal tenderness in the epigastric area and periumbilical area.   Neurological:     Mental Status: She is alert and oriented to person, place, and time. Mental status is at baseline.      UC Treatments / Results  Labs (all labs ordered are listed, but only abnormal results  are displayed) Labs Reviewed - No data to display  EKG   Radiology No results found.  Procedures Procedures (including critical care time)  Medications Ordered in UC Medications - No data to display  Initial Impression / Assessment and Plan / UC Course  I have reviewed the triage vital signs and the nursing notes.  Pertinent labs & imaging results that were available during my care of the patient were reviewed by me and considered in my medical decision making (see chart for details).  Gastroenteritis  Vital signs are stable, patient in no signs of distress nor toxic appearing, symptoms persisting for 7 days will empirically treat for bacteria, prescribed azithromycin, Zofran  and Lomotil advised increase fluid intake with a bland diet as tolerable, may advance diet as tolerated, recommend over-the-counter medications and supportive care with follow-up as needed, patient would like to hold off on stool testing today Final Clinical Impressions(s) / UC Diagnoses   Final diagnoses:  Gastroenteritis     Discharge Instructions      Begin azithromycin for treatment of bacteria most likely causing symptoms  You can use zofran  every 8 hours as needed for nausea, be mindful this medication may make you drowsy, take the first dose at home to see how it affects your body  You can use Lomotil every 6 hours to help with diarrhea, and be mindful over use of this medication may cause opposite effect constipation  You can use over-the-counter ibuprofen  or Tylenol , which ever you have at home, to help manage fevers  Continue to promote hydration throughout the day by using electrolyte replacement solution such as Gatorade, body armor, Pedialyte, which ever you have at home  Try  eating bland foods such as bread, rice, toast, fruit which are easier on the stomach to digest, avoid foods that are overly spicy, overly seasoned or greasy    ED Prescriptions     Medication Sig Dispense Auth. Provider   azithromycin (ZITHROMAX) 250 MG tablet Take 1 tablet (250 mg total) by mouth daily. Take first 2 tablets together, then 1 every day until finished. 6 tablet Mylasia Vorhees R, NP   ondansetron  (ZOFRAN -ODT) 4 MG disintegrating tablet Take 1 tablet (4 mg total) by mouth every 8 (eight) hours as needed. 20 tablet Vera Furniss R, NP   diphenoxylate-atropine (LOMOTIL) 2.5-0.025 MG tablet Take 1 tablet by mouth 4 (four) times daily as needed for diarrhea or loose stools. 20 tablet Josue Falconi R, NP      I have reviewed the PDMP during this encounter.   Teresa Shelba SAUNDERS, NP 11/30/23 1317

## 2023-11-30 NOTE — Discharge Instructions (Addendum)
 Begin azithromycin for treatment of bacteria most likely causing symptoms  You can use zofran  every 8 hours as needed for nausea, be mindful this medication may make you drowsy, take the first dose at home to see how it affects your body  You can use Lomotil every 6 hours to help with diarrhea, and be mindful over use of this medication may cause opposite effect constipation  You can use over-the-counter ibuprofen  or Tylenol , which ever you have at home, to help manage fevers  Continue to promote hydration throughout the day by using electrolyte replacement solution such as Gatorade, body armor, Pedialyte, which ever you have at home  Try eating bland foods such as bread, rice, toast, fruit which are easier on the stomach to digest, avoid foods that are overly spicy, overly seasoned or greasy
